# Patient Record
Sex: Female | Born: 1965
Health system: Southern US, Community
[De-identification: ages and names within clinical notes are randomized; demographics above are authoritative.]

## PROBLEM LIST (undated history)

## (undated) DIAGNOSIS — R945 Abnormal results of liver function studies: Secondary | ICD-10-CM

## (undated) DIAGNOSIS — C4491 Basal cell carcinoma of skin, unspecified: Secondary | ICD-10-CM

## (undated) DIAGNOSIS — F411 Generalized anxiety disorder: Secondary | ICD-10-CM

## (undated) DIAGNOSIS — L408 Other psoriasis: Secondary | ICD-10-CM

## (undated) DIAGNOSIS — M722 Plantar fascial fibromatosis: Principal | ICD-10-CM

## (undated) DIAGNOSIS — F32A Depression, unspecified: Secondary | ICD-10-CM

## (undated) DIAGNOSIS — M79605 Pain in left leg: Secondary | ICD-10-CM

## (undated) DIAGNOSIS — R7989 Other specified abnormal findings of blood chemistry: Secondary | ICD-10-CM

## (undated) DIAGNOSIS — R5383 Other fatigue: Secondary | ICD-10-CM

## (undated) DIAGNOSIS — F319 Bipolar disorder, unspecified: Secondary | ICD-10-CM

## (undated) DIAGNOSIS — M79604 Pain in right leg: Secondary | ICD-10-CM

## (undated) DIAGNOSIS — R635 Abnormal weight gain: Secondary | ICD-10-CM

## (undated) HISTORY — DX: Generalized anxiety disorder: F41.1

## (undated) HISTORY — PX: OTHER SURGICAL HISTORY: SHX169

## (undated) HISTORY — PX: CHOLECYSTECTOMY: SHX55

## (undated) HISTORY — DX: Plantar fascial fibromatosis: M72.2

## (undated) HISTORY — DX: Other specified abnormal findings of blood chemistry: R79.89

## (undated) HISTORY — DX: Bipolar disorder, unspecified: F31.9

## (undated) HISTORY — DX: Depression, unspecified: F32.A

## (undated) HISTORY — DX: Abnormal weight gain: R63.5

## (undated) HISTORY — DX: Other psoriasis: L40.8

## (undated) HISTORY — DX: Pain in right leg: M79.604

## (undated) HISTORY — DX: Abnormal results of liver function studies: R94.5

## (undated) HISTORY — DX: Other fatigue: R53.83

## (undated) HISTORY — DX: Pain in left leg: M79.605

## (undated) HISTORY — PX: TONSILECTOMY, ADENOIDECTOMY, BILATERAL MYRINGOTOMY AND TUBES: SHX2538

## (undated) HISTORY — DX: Basal cell carcinoma of skin, unspecified: C44.91

---

## 2006-05-15 ENCOUNTER — Ambulatory Visit: Payer: Self-pay | Admitting: Internal Medicine

## 2007-01-21 ENCOUNTER — Inpatient Hospital Stay (HOSPITAL_COMMUNITY): Admission: AD | Admit: 2007-01-21 | Discharge: 2007-01-25 | Payer: Self-pay | Admitting: Obstetrics and Gynecology

## 2007-01-21 ENCOUNTER — Encounter (INDEPENDENT_AMBULATORY_CARE_PROVIDER_SITE_OTHER): Payer: Self-pay | Admitting: Specialist

## 2007-01-23 ENCOUNTER — Encounter (INDEPENDENT_AMBULATORY_CARE_PROVIDER_SITE_OTHER): Payer: Self-pay | Admitting: Cardiology

## 2007-01-23 ENCOUNTER — Ambulatory Visit: Payer: Self-pay | Admitting: Vascular Surgery

## 2007-01-23 IMAGING — CT CT ANGIO CHEST
1 of 3 series · 9 of 29 positions shown · IV contrast (150ml omni/300%)
Comparison: none

CLINICAL DATA: Short of breath following C-section.  Assess for pulmonary embolus. 
 CT ANGIOGRAPHY OF CHEST:
TECHNIQUE: Multidetector CT imaging of the chest was performed during bolus injection of intravenous contrast.  Multiplanar CT angiographic image reconstructions were generated to evaluate the vascular anatomy.
 Contrast:  150 cc Omnipaque 300.

[Series 2: pe chest · axial · 0.70mm/px · z∈[-250,-28]mm · 9 of 115 slices shown]
[im 13/115  lung]
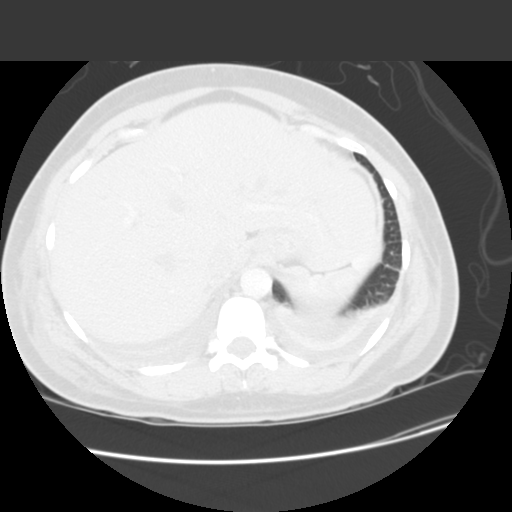
[im 26/115  mediastinal]
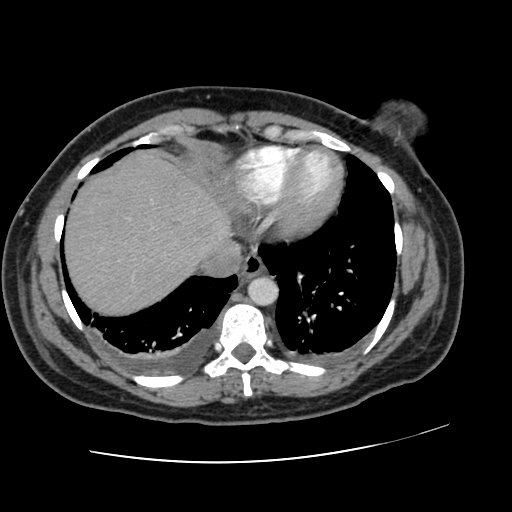
[im 39/115  lung]
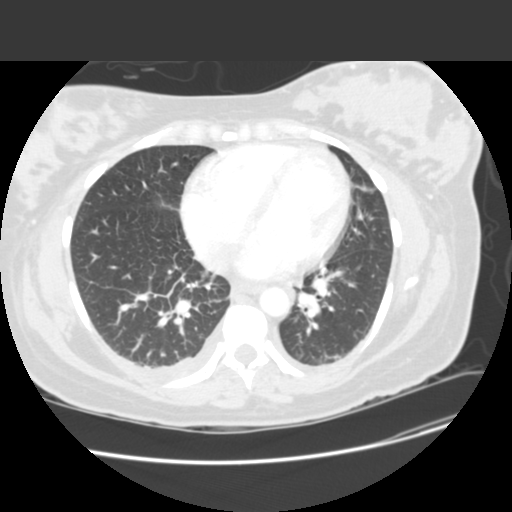
[im 51/115  mediastinal]
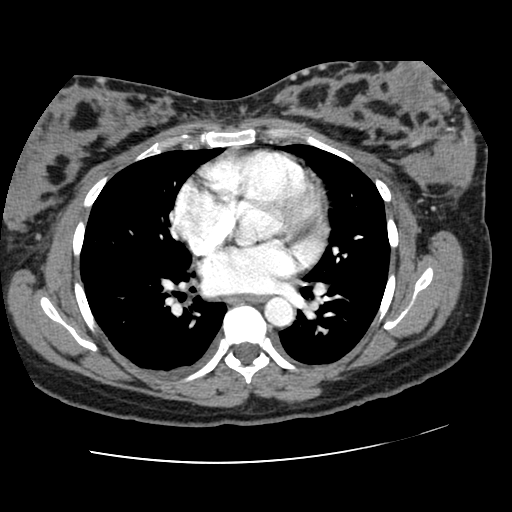
[im 54/115  lung]
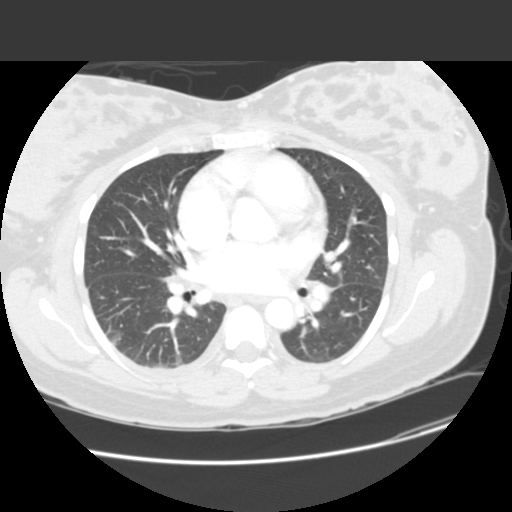
[im 64/115  mediastinal]
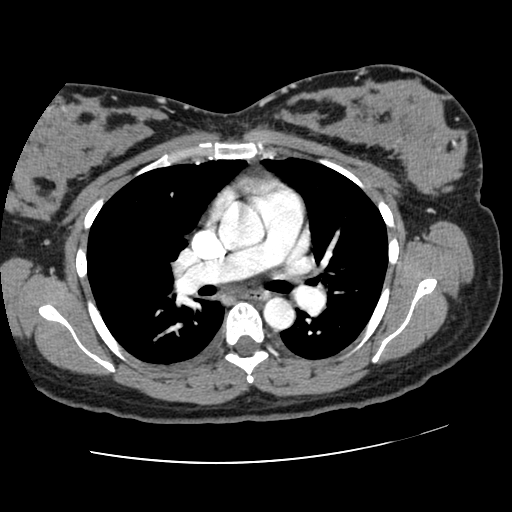
[im 77/115  lung]
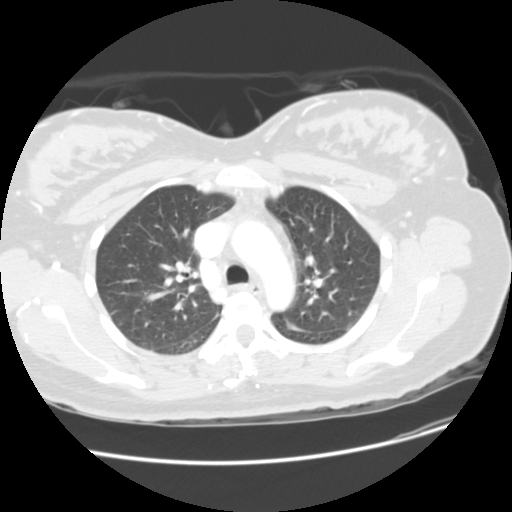
[im 89/115  mediastinal]
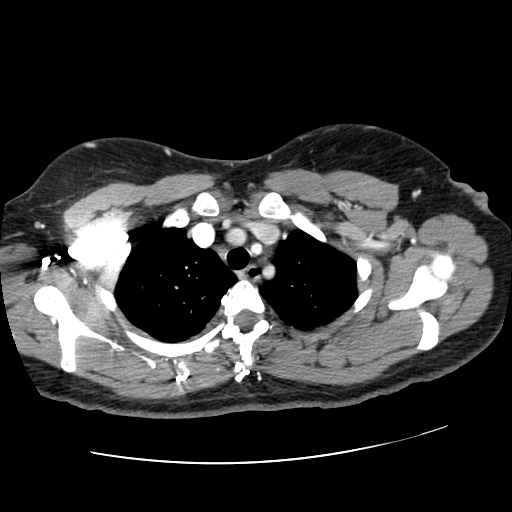
[im 102/115  lung]
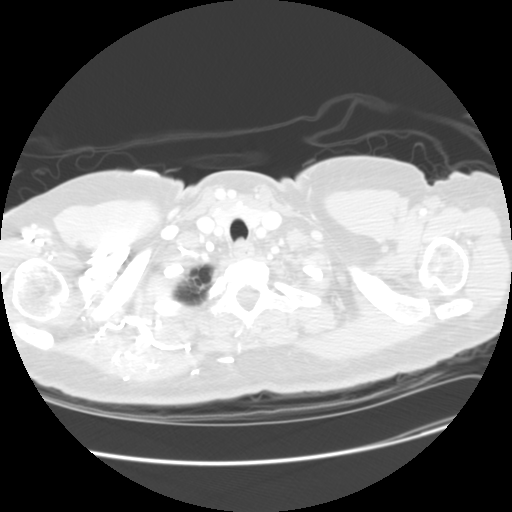

[9 of 29 positions shown; findings below may reference images not displayed]

FINDINGS: Scans in the upper abdomen are unremarkable.  There are bilateral pleural effusions with dependent pulmonary atelectasis.  The lungs show a pattern of increased interstitial prominence that could represent edema.  The pulmonary arterial tree does not show any emboli.  No aortic pathology is seen.  No chest wall lesion.
IMPRESSION: 1.  No evidence of pulmonary emboli or acute aortic pathology. 
 2.  Bilateral pleural effusions.  Dependent pulmonary atelectasis and a mild interstitial edema pattern.

## 2007-01-23 IMAGING — CT CT HEAD W/O CM
1 series · 16 of 30 positions shown, 20 images · IV contrast (agent unspecified)
Comparison: none

CLINICAL DATA: Headaches.  Post C-section. 
 HEAD CT WITH CONTRAST:
TECHNIQUE: Contiguous axial images were obtained from the base of the skull through the vertex according to standard protocol without contrast.  This study was done after a CT angiogram of the chest and therefore this is a contrast-enhanced study, though no additional contrast was given for this beyond the CT study.

[Series 2: brain · axial · 0.47mm/px · z∈[+78,+216]mm · 16 of 30 slices shown, 20 images]
[im 2/30  brain]
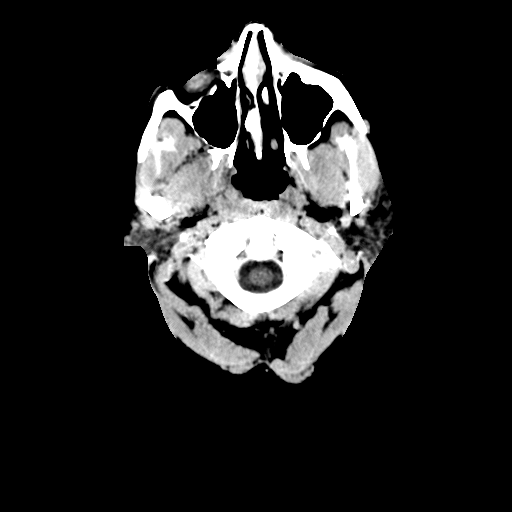
[im 2/30  bone]
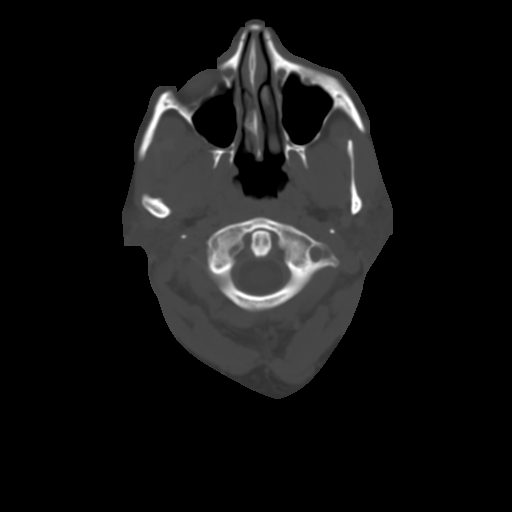
[im 4/30  brain]
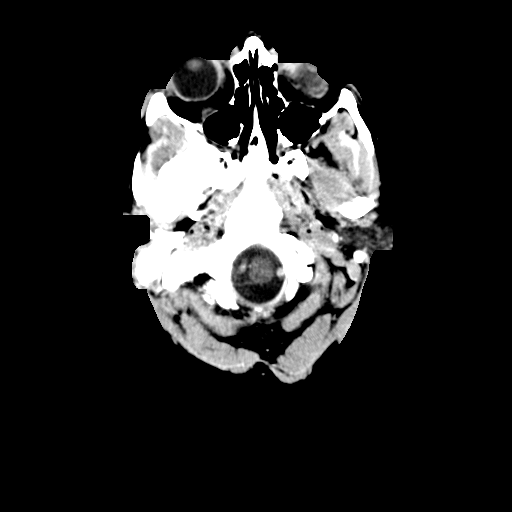
[im 6/30  brain]
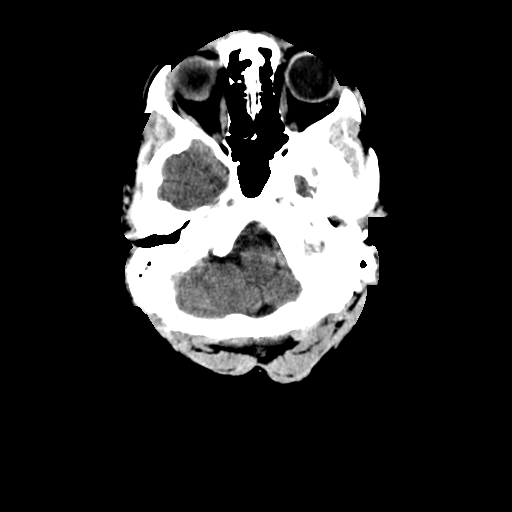
[im 8/30  brain]
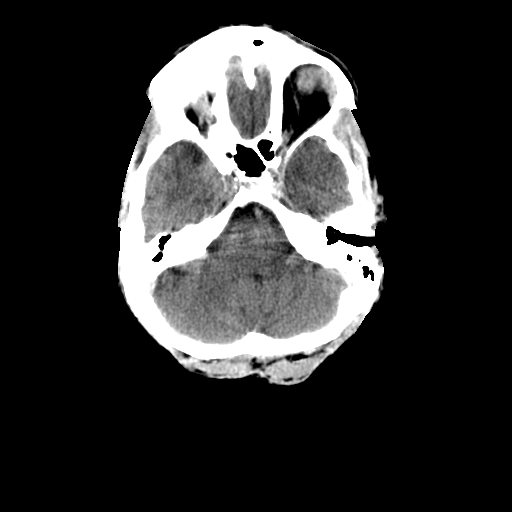
[im 9/30  brain]
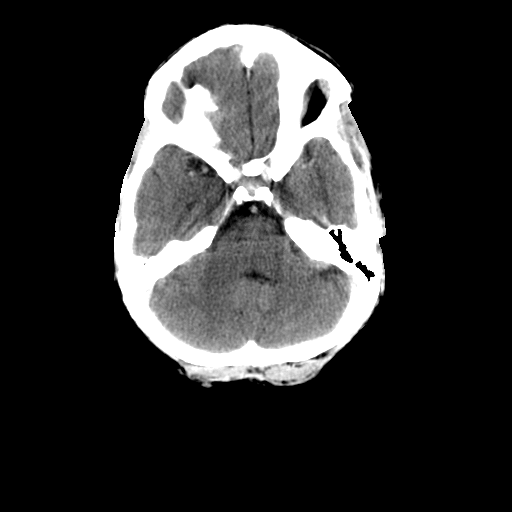
[im 9/30  bone]
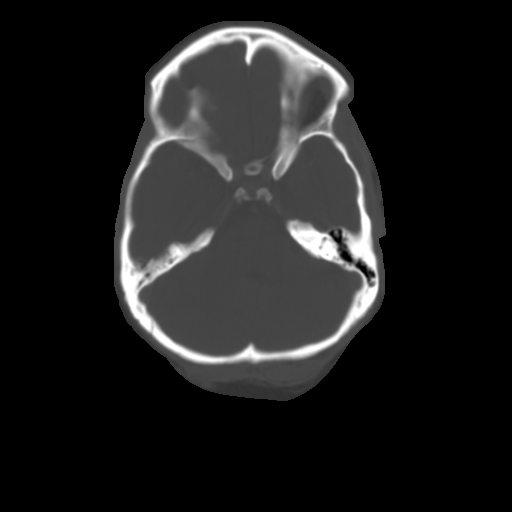
[im 11/30  brain]
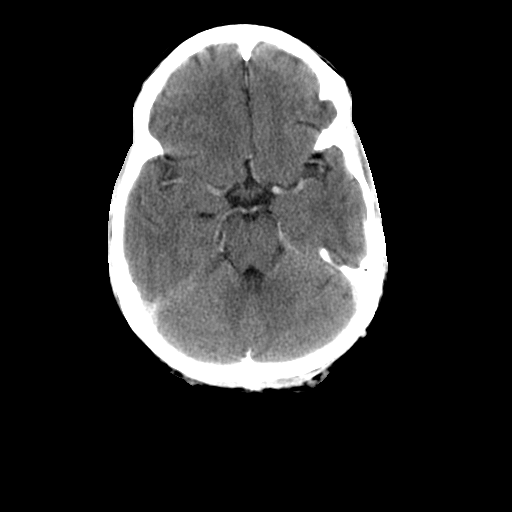
[im 13/30  brain]
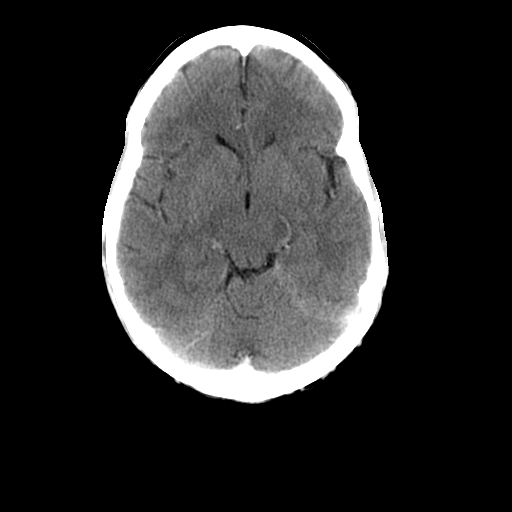
[im 15/30  brain]
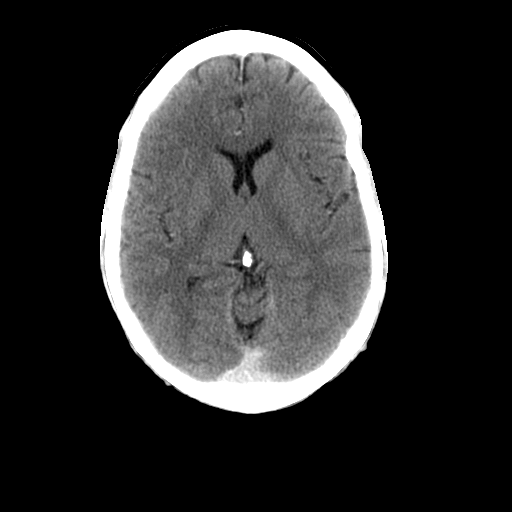
[im 16/30  brain]
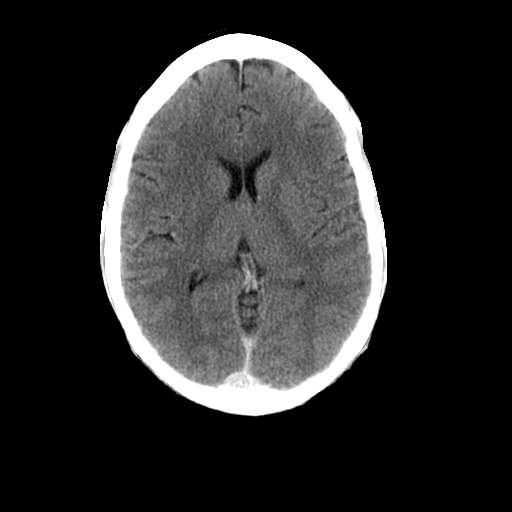
[im 16/30  bone]
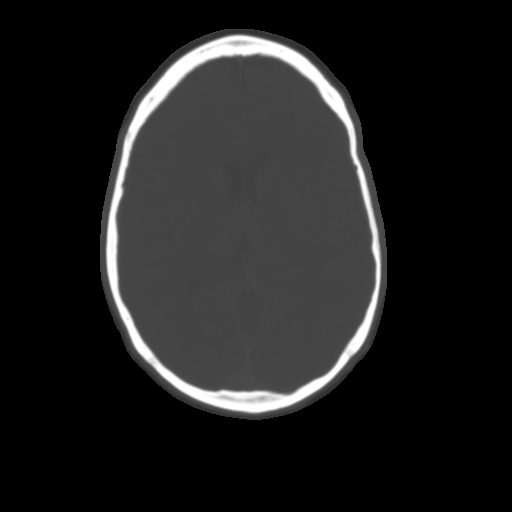
[im 18/30  brain]
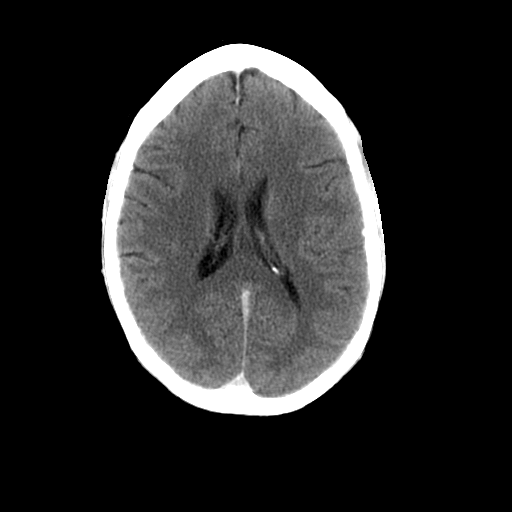
[im 20/30  brain]
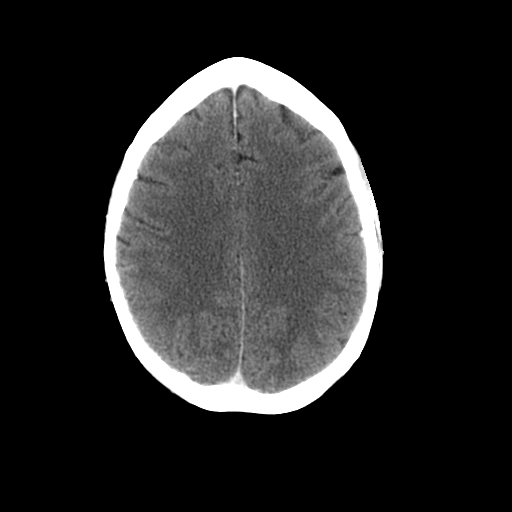
[im 22/30  brain]
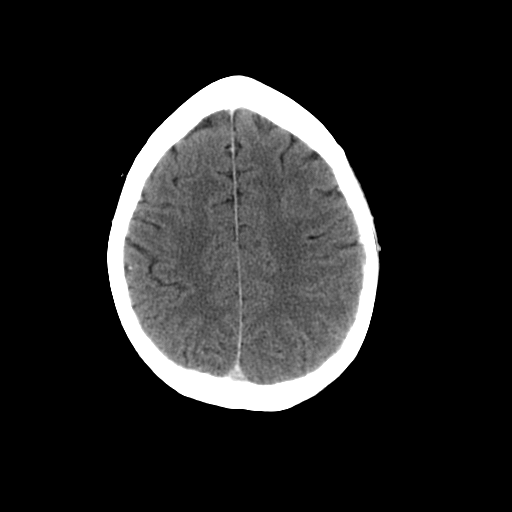
[im 23/30  brain]
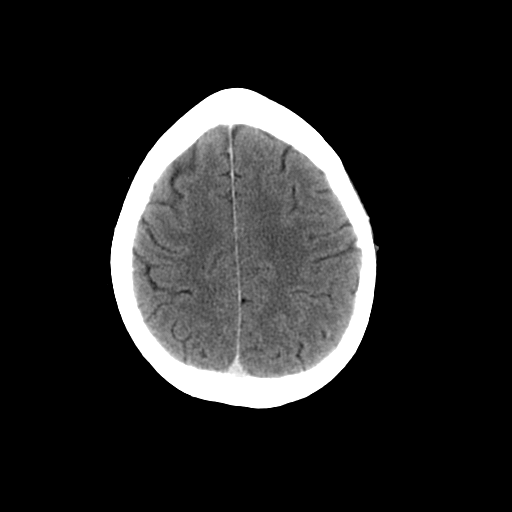
[im 23/30  bone]
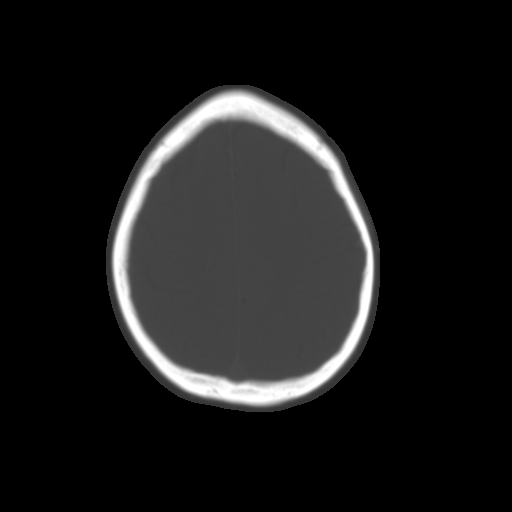
[im 25/30  brain]
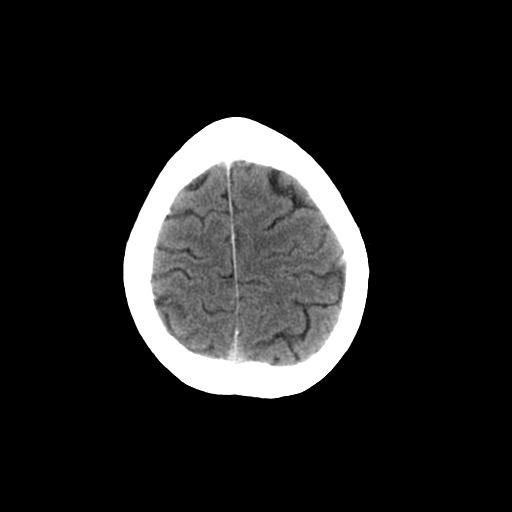
[im 27/30  brain]
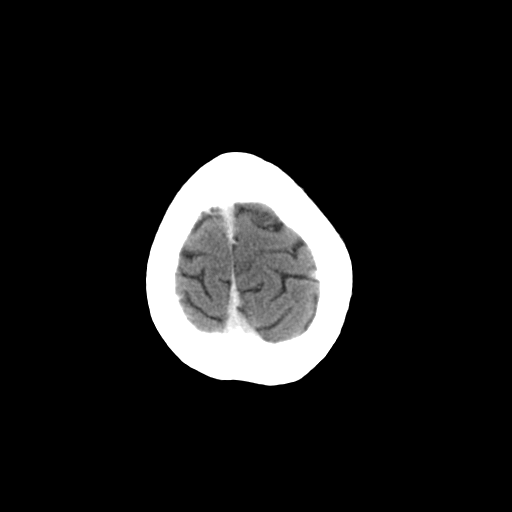
[im 29/30  brain]
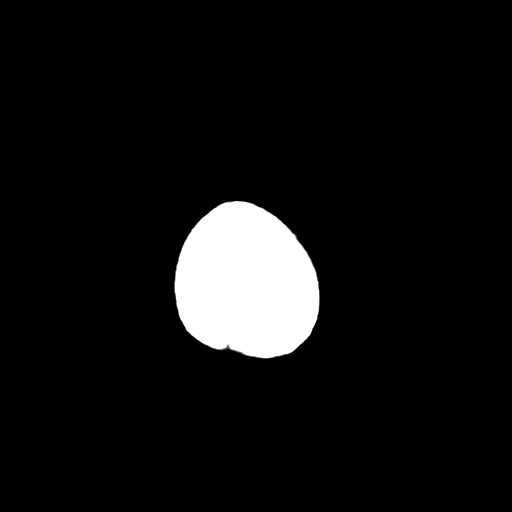

[16 of 30 positions shown; findings below may reference images not displayed]

FINDINGS: The brain has a normal appearance without evidence of atrophy, stroke, mass, hemorrhage, hydrocephalus or extraaxial collection.  The calvarium is unremarkable.  The paranasal sinuses, middle ears and mastoids are clear.
IMPRESSION: Normal exam.

## 2007-01-26 ENCOUNTER — Ambulatory Visit: Admission: RE | Admit: 2007-01-26 | Discharge: 2007-01-26 | Payer: Self-pay | Admitting: Obstetrics and Gynecology

## 2007-04-24 DIAGNOSIS — L408 Other psoriasis: Secondary | ICD-10-CM

## 2007-04-24 HISTORY — DX: Other psoriasis: L40.8

## 2007-05-22 ENCOUNTER — Ambulatory Visit: Payer: Self-pay | Admitting: Internal Medicine

## 2007-05-22 DIAGNOSIS — F329 Major depressive disorder, single episode, unspecified: Secondary | ICD-10-CM

## 2007-05-22 DIAGNOSIS — F419 Anxiety disorder, unspecified: Secondary | ICD-10-CM | POA: Insufficient documentation

## 2007-05-22 DIAGNOSIS — F319 Bipolar disorder, unspecified: Secondary | ICD-10-CM

## 2007-05-22 DIAGNOSIS — F411 Generalized anxiety disorder: Secondary | ICD-10-CM

## 2007-05-22 DIAGNOSIS — K8020 Calculus of gallbladder without cholecystitis without obstruction: Secondary | ICD-10-CM | POA: Insufficient documentation

## 2007-05-22 DIAGNOSIS — F32A Depression, unspecified: Secondary | ICD-10-CM | POA: Insufficient documentation

## 2007-05-22 HISTORY — DX: Bipolar disorder, unspecified: F31.9

## 2007-05-22 HISTORY — DX: Generalized anxiety disorder: F41.1

## 2007-05-23 LAB — CONVERTED CEMR LAB
ALT: 22 units/L (ref 0–35)
AST: 19 units/L (ref 0–37)
Albumin: 3.7 g/dL (ref 3.5–5.2)
Alkaline Phosphatase: 40 units/L (ref 39–117)
BUN: 12 mg/dL (ref 6–23)
Basophils Absolute: 0 10*3/uL (ref 0.0–0.1)
Basophils Relative: 0.1 % (ref 0.0–1.0)
Bilirubin, Direct: 0.1 mg/dL (ref 0.0–0.3)
CO2: 31 meq/L (ref 19–32)
Calcium: 9.6 mg/dL (ref 8.4–10.5)
Chloride: 108 meq/L (ref 96–112)
Cholesterol: 151 mg/dL (ref 0–200)
Creatinine, Ser: 0.7 mg/dL (ref 0.4–1.2)
Eosinophils Absolute: 0.2 10*3/uL (ref 0.0–0.6)
Eosinophils Relative: 1.9 % (ref 0.0–5.0)
GFR calc Af Amer: 119 mL/min
GFR calc non Af Amer: 98 mL/min
Glucose, Bld: 101 mg/dL — ABNORMAL HIGH (ref 70–99)
HCT: 38.1 % (ref 36.0–46.0)
HDL: 42.8 mg/dL (ref >39.0)
Hemoglobin: 13.1 g/dL (ref 12.0–15.0)
LDL Cholesterol: 97 mg/dL (ref 0–99)
Lymphocytes Relative: 26.3 % (ref 12.0–46.0)
MCHC: 34.4 g/dL (ref 30.0–36.0)
MCV: 87 fL (ref 78.0–100.0)
Monocytes Absolute: 0.5 10*3/uL (ref 0.2–0.7)
Monocytes Relative: 6.6 % (ref 3.0–11.0)
Neutro Abs: 5.4 10*3/uL (ref 1.4–7.7)
Neutrophils Relative %: 65.1 % (ref 43.0–77.0)
Platelets: 275 10*3/uL (ref 150–400)
Potassium: 4.1 meq/L (ref 3.5–5.1)
RBC: 4.38 M/uL (ref 3.87–5.11)
RDW: 12.5 % (ref 11.5–14.6)
Sodium: 146 meq/L — ABNORMAL HIGH (ref 135–145)
TSH: 1.85 microintl units/mL (ref 0.35–5.50)
Total Bilirubin: 0.6 mg/dL (ref 0.3–1.2)
Total CHOL/HDL Ratio: 3.5
Total Protein: 7.3 g/dL (ref 6.0–8.3)
Triglycerides: 56 mg/dL (ref 0–149)
VLDL: 11 mg/dL (ref 0–40)
WBC: 8.3 10*3/uL (ref 4.5–10.5)

## 2007-07-11 ENCOUNTER — Ambulatory Visit: Payer: Self-pay | Admitting: Internal Medicine

## 2007-07-12 ENCOUNTER — Telehealth: Payer: Self-pay | Admitting: Internal Medicine

## 2008-07-18 ENCOUNTER — Ambulatory Visit: Payer: Self-pay | Admitting: Internal Medicine

## 2008-07-18 LAB — CONVERTED CEMR LAB: Rapid Strep: NEGATIVE

## 2008-07-30 ENCOUNTER — Ambulatory Visit: Payer: Self-pay | Admitting: Internal Medicine

## 2008-08-04 ENCOUNTER — Telehealth (INDEPENDENT_AMBULATORY_CARE_PROVIDER_SITE_OTHER): Payer: Self-pay | Admitting: *Deleted

## 2008-09-26 ENCOUNTER — Ambulatory Visit: Payer: Self-pay | Admitting: Internal Medicine

## 2009-01-10 ENCOUNTER — Encounter: Payer: Self-pay | Admitting: Internal Medicine

## 2009-01-10 ENCOUNTER — Ambulatory Visit: Payer: Self-pay | Admitting: Family Medicine

## 2009-01-10 LAB — CONVERTED CEMR LAB: Rapid Strep: NEGATIVE

## 2009-01-12 ENCOUNTER — Ambulatory Visit: Payer: Self-pay | Admitting: Internal Medicine

## 2009-01-12 LAB — CONVERTED CEMR LAB: Rapid Strep: NEGATIVE

## 2009-02-23 ENCOUNTER — Telehealth: Payer: Self-pay | Admitting: Internal Medicine

## 2009-02-24 ENCOUNTER — Ambulatory Visit: Payer: Self-pay | Admitting: Family Medicine

## 2009-02-24 LAB — CONVERTED CEMR LAB: Rapid Strep: NEGATIVE

## 2009-02-25 ENCOUNTER — Encounter: Payer: Self-pay | Admitting: Family Medicine

## 2009-03-02 ENCOUNTER — Telehealth: Payer: Self-pay | Admitting: *Deleted

## 2009-04-13 ENCOUNTER — Ambulatory Visit: Payer: Self-pay | Admitting: Family Medicine

## 2009-04-13 ENCOUNTER — Encounter: Payer: Self-pay | Admitting: Family Medicine

## 2009-04-13 LAB — CONVERTED CEMR LAB: Rapid Strep: NEGATIVE

## 2009-04-14 ENCOUNTER — Encounter: Payer: Self-pay | Admitting: Family Medicine

## 2009-04-17 ENCOUNTER — Telehealth: Payer: Self-pay | Admitting: Family Medicine

## 2009-05-07 ENCOUNTER — Encounter: Payer: Self-pay | Admitting: Internal Medicine

## 2009-07-14 ENCOUNTER — Telehealth: Payer: Self-pay | Admitting: Internal Medicine

## 2009-12-03 ENCOUNTER — Ambulatory Visit: Payer: Self-pay | Admitting: Internal Medicine

## 2009-12-03 LAB — CONVERTED CEMR LAB
ALT: 22 units/L (ref 0–35)
AST: 22 units/L (ref 0–37)
Albumin: 4.2 g/dL (ref 3.5–5.2)
Alkaline Phosphatase: 45 units/L (ref 39–117)
BUN: 14 mg/dL (ref 6–23)
Basophils Absolute: 0 10*3/uL (ref 0.0–0.1)
Basophils Relative: 0.6 % (ref 0.0–3.0)
Bilirubin Urine: NEGATIVE
Bilirubin, Direct: 0.1 mg/dL (ref 0.0–0.3)
Blood in Urine, dipstick: NEGATIVE
CO2: 29 meq/L (ref 19–32)
Calcium: 9.4 mg/dL (ref 8.4–10.5)
Chloride: 110 meq/L (ref 96–112)
Cholesterol: 140 mg/dL (ref 0–200)
Creatinine, Ser: 0.8 mg/dL (ref 0.4–1.2)
Eosinophils Absolute: 0.1 10*3/uL (ref 0.0–0.7)
Eosinophils Relative: 1.6 % (ref 0.0–5.0)
GFR calc non Af Amer: 82.98 mL/min (ref >60)
Glucose, Bld: 104 mg/dL — ABNORMAL HIGH (ref 70–99)
Glucose, Urine, Semiquant: NEGATIVE
HCT: 37.6 % (ref 36.0–46.0)
HDL: 38.6 mg/dL — ABNORMAL LOW (ref >39.00)
Hemoglobin: 12.7 g/dL (ref 12.0–15.0)
Ketones, urine, test strip: NEGATIVE
LDL Cholesterol: 77 mg/dL (ref 0–99)
Lymphocytes Relative: 32.3 % (ref 12.0–46.0)
Lymphs Abs: 2 10*3/uL (ref 0.7–4.0)
MCHC: 33.8 g/dL (ref 30.0–36.0)
MCV: 92.1 fL (ref 78.0–100.0)
Monocytes Absolute: 0.3 10*3/uL (ref 0.1–1.0)
Monocytes Relative: 4.8 % (ref 3.0–12.0)
Neutro Abs: 3.7 10*3/uL (ref 1.4–7.7)
Neutrophils Relative %: 60.7 % (ref 43.0–77.0)
Nitrite: NEGATIVE
Platelets: 229 10*3/uL (ref 150.0–400.0)
Potassium: 4.6 meq/L (ref 3.5–5.1)
RBC: 4.08 M/uL (ref 3.87–5.11)
RDW: 11.7 % (ref 11.5–14.6)
Sodium: 145 meq/L (ref 135–145)
Specific Gravity, Urine: 1.02
TSH: 2.71 microintl units/mL (ref 0.35–5.50)
Total Bilirubin: 0.3 mg/dL (ref 0.3–1.2)
Total CHOL/HDL Ratio: 4
Total Protein: 7.5 g/dL (ref 6.0–8.3)
Triglycerides: 124 mg/dL (ref 0.0–149.0)
Urobilinogen, UA: 0.2
VLDL: 24.8 mg/dL (ref 0.0–40.0)
WBC Urine, dipstick: NEGATIVE
WBC: 6.1 10*3/uL (ref 4.5–10.5)
pH: 7

## 2009-12-10 ENCOUNTER — Ambulatory Visit: Payer: Self-pay | Admitting: Internal Medicine

## 2009-12-10 DIAGNOSIS — G4700 Insomnia, unspecified: Secondary | ICD-10-CM | POA: Insufficient documentation

## 2010-01-21 ENCOUNTER — Ambulatory Visit (HOSPITAL_COMMUNITY): Admission: RE | Admit: 2010-01-21 | Discharge: 2010-01-21 | Payer: Self-pay | Admitting: Obstetrics and Gynecology

## 2010-01-21 IMAGING — MG MM DIGITAL SCREENING
4 series · 4 of 4 positions shown · non-contrast
Comparison: none

DG SCREEN MAMMOGRAM BILATERAL
Bilateral CC and MLO view(s) were taken.
Technologist: ENANO.(ENANO)(M)

DIGITAL SCREENING MAMMOGRAM WITH CAD:
There are scattered fibroglandular densities.  There is no dominant mass, architectural distortion 
or calcification to suggest malignancy.
Images were processed with CAD.

[R CC]
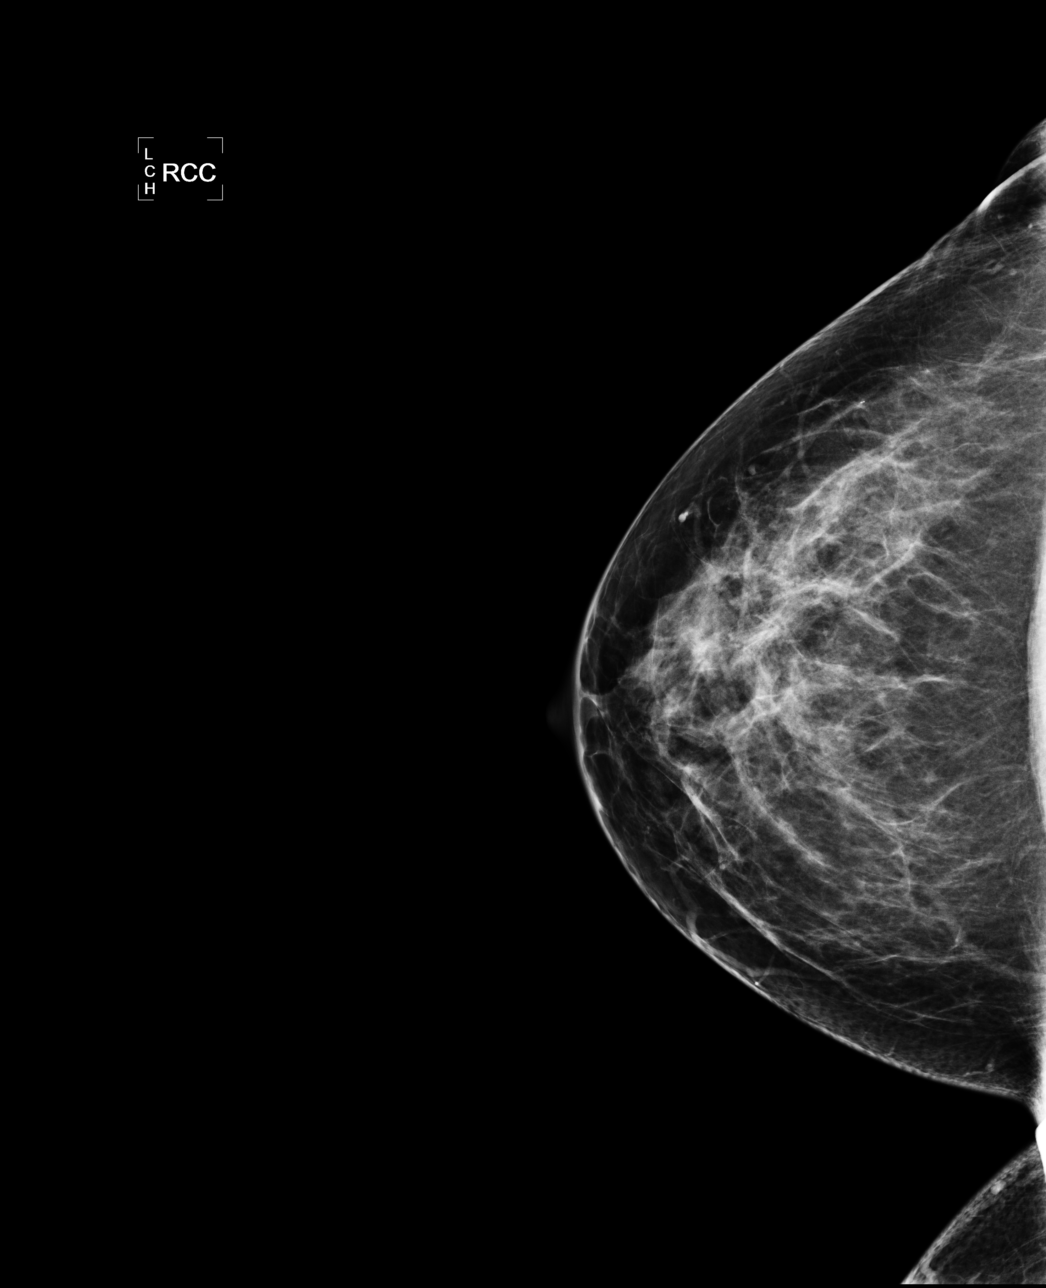

[R MLO]
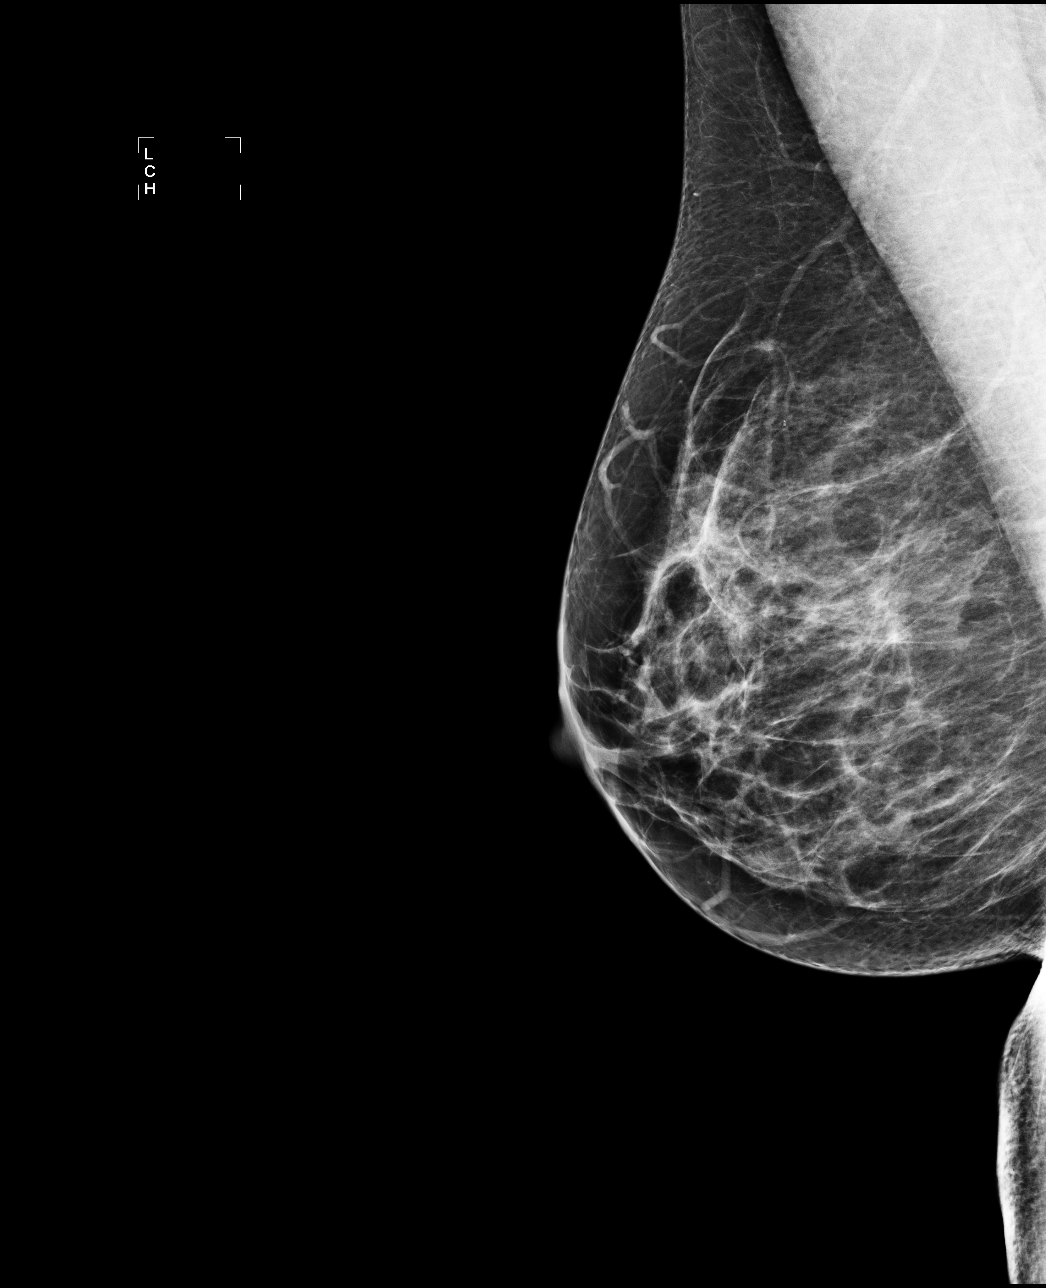

[L CC]
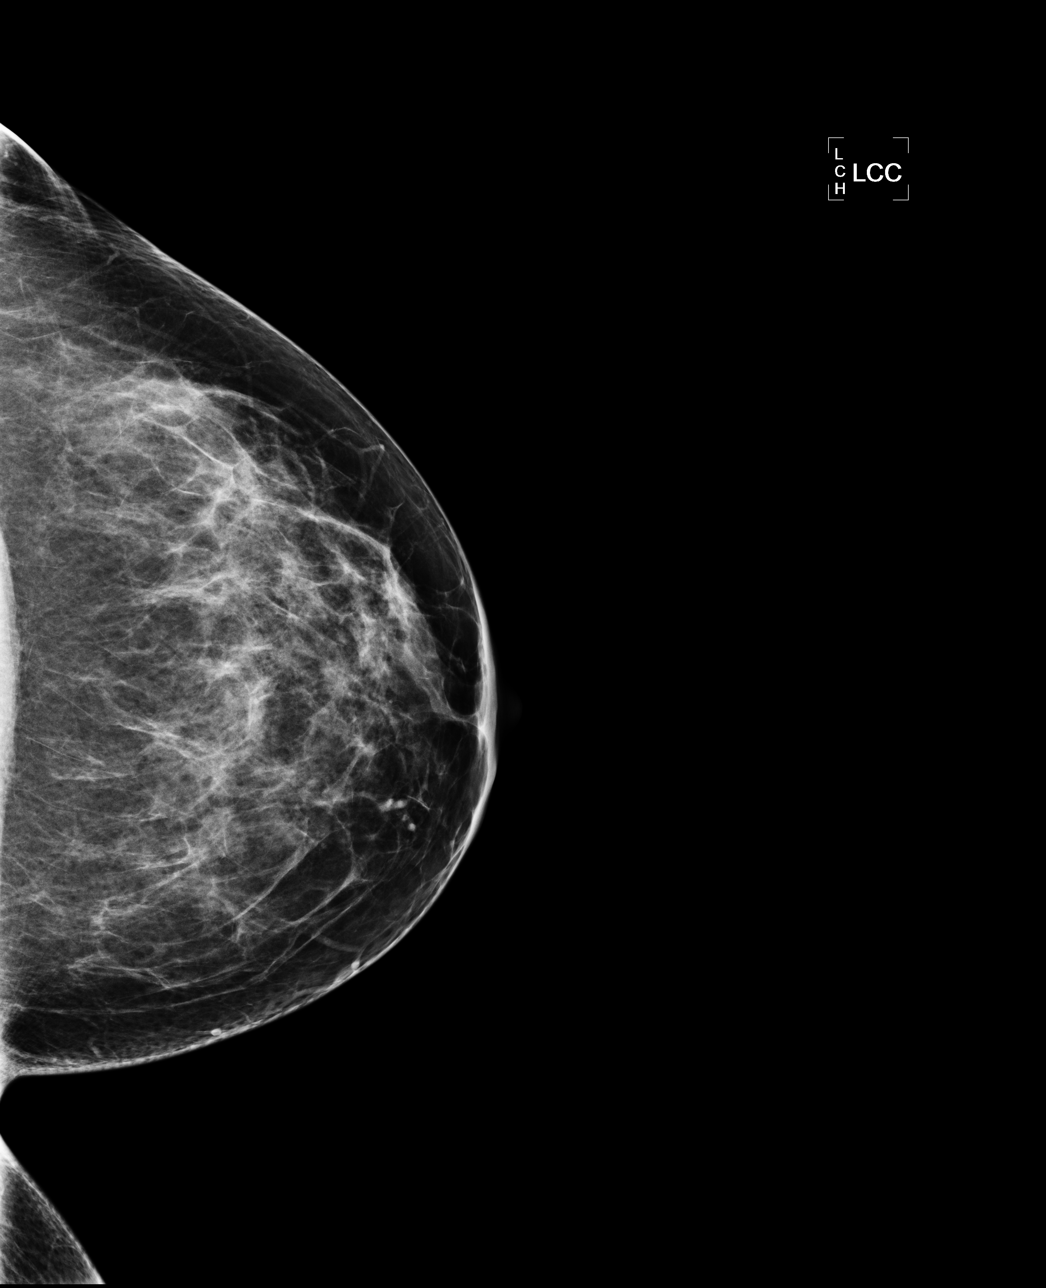

[L MLO]
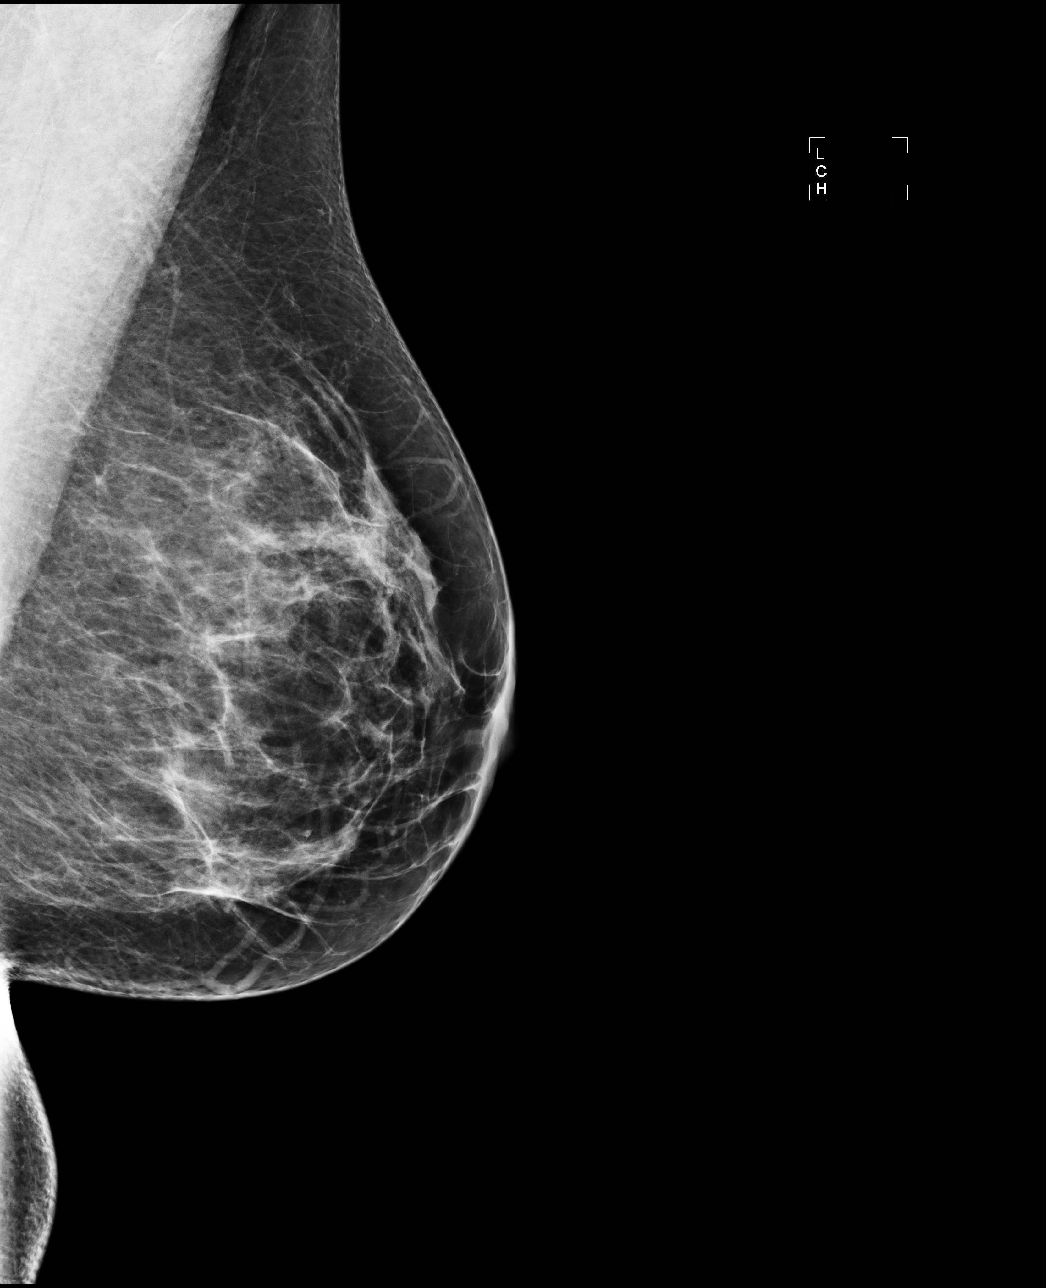

[4 of 4 positions shown; findings below may reference images not displayed]

IMPRESSION: No mammographic evidence of malignancy.  Suggest yearly screening mammography.

A result letter of this screening mammogram will be mailed directly to the patient.

ASSESSMENT: Negative - BI-RADS 1

Screening mammogram in 1 year.
,

## 2010-03-02 ENCOUNTER — Telehealth: Payer: Self-pay | Admitting: Internal Medicine

## 2010-06-30 ENCOUNTER — Ambulatory Visit: Payer: Self-pay | Admitting: Internal Medicine

## 2010-10-10 ENCOUNTER — Encounter: Payer: Self-pay | Admitting: *Deleted

## 2010-10-19 NOTE — Assessment & Plan Note (Signed)
Summary: STEP THROAT/LD   Vital Signs:  Patient profile:   45 year old female Height:      65 inches Weight:      160 pounds BMI:     26.72 Temp:     97.9 degrees F oral Pulse rate:   88 / minute Pulse rhythm:   regular BP sitting:   126 / 80  (left arm) Cuff size:   regular  Vitals Entered By: Liane Comber (January 10, 2009 11:07 AM)  History of Present Illness: whole body is aching  R ear hurts R tonsil is inflammed - and some white stuff on it  has had strep many times in past - really feels like it   no particular exp is working lots of hours - generally really run down   no nasal symptoms or drainage  no fever known but is achey - and legs feel sore and crampy   no cough  no rash  no joint swelling  does have headache   Allergies: No Known Drug Allergies  Past History:  Past Medical History:    psoriasis    heart ablation    Cholelithiasis    frequent pharyngitis   Past Surgical History:    Reviewed history from 05/22/2007 and no changes required:    csection x 2    c section--requiring blood transfusion    Cholecystectomy  Review of Systems General:  Complains of chills, fatigue, and malaise; denies loss of appetite. Eyes:  Denies discharge and eye pain. ENT:  Complains of earache and hoarseness; denies ear discharge, nasal congestion, and postnasal drainage. CV:  Denies chest pain or discomfort and palpitations. Resp:  Denies cough and wheezing. GI:  Denies abdominal pain, change in bowel habits, nausea, and vomiting. MS:  Denies joint pain. Derm:  Denies itching and rash.  Physical Exam  General:  Well-developed,well-nourished,in no acute distress; alert,appropriate and cooperative throughout examination Head:  normocephalic, atraumatic, and no abnormalities observed.  no sinus tenderness  Eyes:  vision grossly intact, pupils equal, pupils round, pupils reactive to light, and no injection.   Ears:  TMs clear bilat  Nose:  nares slt boggy but  clear  Mouth:  enlarged R tonsil with crypts/debris , poss scant exudate , also erythema  mild post pharyngeal erythema  Neck:  No deformities, masses, or tenderness noted. Lungs:  Normal respiratory effort, chest expands symmetrically. Lungs are clear to auscultation, no crackles or wheezes. Heart:  Normal rate and regular rhythm. S1 and S2 normal without gallop, murmur, click, rub or other extra sounds. Skin:  Intact without suspicious lesions or rashes Cervical Nodes:  No lymphadenopathy noted Psych:  normal affect, talkative and pleasant    Impression & Recommendations:  Problem # 1:  TONSILLITIS (ICD-463) Assessment New R tonsillar erythema/swelling with some debris (poss small amt exudate)  rapid strep neg throat cx sent  amoxicillin 500 three times a day for 10 days update if worse/prob swallowing  recommend sympt care- see pt instructions   Orders: T-Culture, Throat ([id number]) Rapid Strep (28413)  Complete Medication List: 1)  Alprazolam 0.25 Mg Tabs (Alprazolam) .... Once daily as needed 2)  Citalopram Hydrobromide 20 Mg Tabs (Citalopram hydrobromide) .... Once daily 3)  Allfen Dm 400-20 Mg Tabs (Dextromethorphan-guaifenesin) .... Take 1 tablet by mouth two times a day 4)  Fluticasone Propionate 50 Mcg/act Susp (Fluticasone propionate) .... 2 sprays each nostril once daily 5)  Amoxicillin 500 Mg Tabs (Amoxicillin) .Marland Kitchen.. 1 by mouth three times a day  for 10 days  Patient Instructions: 1)  drink lots of fluids  2)  tylenol as needed - pain and fever  3)  salt water gargle or chloraseptic spray 4)  update me if not improved next week or if worse or unable to swallow  5)  take the amoxicillin as directed  6)  will update you when throat culture returns  Prescriptions: AMOXICILLIN 500 MG TABS (AMOXICILLIN) 1 by mouth three times a day for 10 days  #30 x 0   Entered and Authorized by:   Judith Part MD   Signed by:   Judith Part MD on 01/10/2009   Method used:    Print then Give to Patient   RxID:   [id number]       Prior Medications (reviewed today): ALPRAZOLAM 0.25 MG TABS (ALPRAZOLAM) once daily as needed CITALOPRAM HYDROBROMIDE 20 MG TABS (CITALOPRAM HYDROBROMIDE) once daily ALLFEN DM 400-20 MG TABS (DEXTROMETHORPHAN-GUAIFENESIN) Take 1 tablet by mouth two times a day FLUTICASONE PROPIONATE 50 MCG/ACT  SUSP (FLUTICASONE PROPIONATE) 2 sprays each nostril once daily Current Allergies (reviewed today): No known allergies  Current Medications (including changes made in today's visit):  ALPRAZOLAM 0.25 MG TABS (ALPRAZOLAM) once daily as needed CITALOPRAM HYDROBROMIDE 20 MG TABS (CITALOPRAM HYDROBROMIDE) once daily ALLFEN DM 400-20 MG TABS (DEXTROMETHORPHAN-GUAIFENESIN) Take 1 tablet by mouth two times a day FLUTICASONE PROPIONATE 50 MCG/ACT  SUSP (FLUTICASONE PROPIONATE) 2 sprays each nostril once daily AMOXICILLIN 500 MG TABS (AMOXICILLIN) 1 by mouth three times a day for 10 days   Laboratory Results  Date/Time Received: January 10, 2009 11:16 AM Date/Time Reported: January 10, 2009 11:16 AM  Other Tests  Rapid Strep: negative

## 2010-10-19 NOTE — Progress Notes (Signed)
Summary: refill alprazolam  Phone Note From Pharmacy Call back at [phone] fax   [facility]  [address]  [phone]* Call For: swords  Summary of Call: Refill alprazolam 0.25mg  Take one tablet by mouth every other day as needed .  Last 12/03/09 #15 x 2 Initial call taken by: Gladis Riffle, RN,  March 02, 2010 3:18 PM  Follow-up for Phone Call        Was removed from med list 12/10/09 as was" using too much for sleep".  Please advise. Follow-up by: Gladis Riffle, RN,  March 02, 2010 3:19 PM  Additional Follow-up for Phone Call Additional follow up Details #1::        ok to refill Additional Follow-up by: Birdie Sons MD,  March 02, 2010 3:25 PM    Additional Follow-up for Phone Call Additional follow up Details #2::    see Rx Follow-up by: Gladis Riffle, RN,  March 02, 2010 4:12 PM  New/Updated Medications: ALPRAZOLAM 0.25 MG TABS (ALPRAZOLAM) Take one tablet by mouth every other day as needed Prescriptions: ALPRAZOLAM 0.25 MG TABS (ALPRAZOLAM) Take one tablet by mouth every other day as needed  #15 x 2   Entered by:   Gladis Riffle, RN   Authorized by:   Birdie Sons MD   Signed by:   Gladis Riffle, RN on 03/02/2010   Method used:   Telephoned to ...       [facility]  [phone]* (retail)       [address] [place], [place]  [address]       Ph: [phone] or [phone]       Fax: [phone]   RxID:   [id number]

## 2010-10-19 NOTE — Op Note (Signed)
Summary: Tonsillectomy/Dr. Narda Bonds  Tonsillectomy/Dr. Narda Bonds   Imported By: Maryln Gottron 05/21/2009 10:27:42  _____________________________________________________________________  External Attachment:    Type:   Image     Comment:   External Document

## 2010-10-19 NOTE — Assessment & Plan Note (Signed)
Summary: ear ache/mhf   Vital Signs:  Patient Profile:   45 Years Old Female Height:     65 inches Temp:     98.6 degrees F Pulse rate:   76 / minute BP sitting:   124 / 98  (left arm)  Vitals Entered By: Gladis Riffle, RN (September 26, 2008 10:08 AM)                 Chief Complaint:  c/o bilateral ear pain and right ear congestion--missed ent appt because had resolved.  History of Present Illness: reviewed previous notes and phone notes pt did not see ENT becuase sxs resolved she now has 3 week hx of uri sxs started with cough, sinus congestion,  no fever or chills  has 2 kids under 3 both of whom have been sick (URI sxs), husband with sxs.   Past Medical History: psoriasis heart ablation Cholelithiasis  Past Surgical History: csection x 2 c section--requiring blood transfusion Cholecystectomy  Social History: Married Former Smoker Regular exercise-no Occupation: free lance for [employer]  Family History: mother alcohol/smoker Family History of Alcoholism/Addiction father alive and well no other complaints in a complete ROS     Prior Medication List:  ALPRAZOLAM 0.25 MG TABS (ALPRAZOLAM) once daily as needed CITALOPRAM HYDROBROMIDE 20 MG TABS (CITALOPRAM HYDROBROMIDE) once daily   Current Allergies (reviewed today): No known allergies       Physical Exam  General:     Well-developed,well-nourished,in no acute distress; alert,appropriate and cooperative throughout examination Head:     Normocephalic and atraumatic without obvious abnormalities. No apparent alopecia or balding. Eyes:     pupils equal and pupils round.   Nose:     External nasal examination shows no deformity or inflammation. Nasal mucosa are pink and moist without lesions or exudates. Mouth:     Oral mucosa and oropharynx without lesions or exudates.  Teeth in good repair. Neck:     No deformities, masses, or tenderness noted. Lungs:     Normal respiratory effort,  chest expands symmetrically. Lungs are clear to auscultation, no crackles or wheezes. Heart:     Normal rate and regular rhythm. S1 and S2 normal without gallop, murmur, click, rub or other extra sounds.    Impression & Recommendations:  Problem # 1:  URI (ICD-465.9) no evidence of bacterial infection. call for any concerns, increased sxs, fever, persistence of sxs, wheeze, SOB.   Her updated medication list for this problem includes:    Allfen Dm 400-20 Mg Tabs (Dextromethorphan-guaifenesin) .Marland Kitchen... Take 1 tablet by mouth two times a day  fluticasone call if sxs persist   Complete Medication List: 1)  Alprazolam 0.25 Mg Tabs (Alprazolam) .... Once daily as needed 2)  Citalopram Hydrobromide 20 Mg Tabs (Citalopram hydrobromide) .... Once daily 3)  Allfen Dm 400-20 Mg Tabs (Dextromethorphan-guaifenesin) .... Take 1 tablet by mouth two times a day 4)  Fluticasone Propionate 50 Mcg/act Susp (Fluticasone propionate) .... 2 sprays each nostril once daily    Prescriptions: FLUTICASONE PROPIONATE 50 MCG/ACT  SUSP (FLUTICASONE PROPIONATE) 2 sprays each nostril once daily  #1 vial x 3   Entered and Authorized by:   Birdie Sons MD   Signed by:   Birdie Sons MD on 09/26/2008   Method used:   Handwritten   RxID:   [id number]

## 2010-10-19 NOTE — Progress Notes (Signed)
Summary: alprazolam refill  Phone Note From Pharmacy   Summary of Call: patient would like a refill of alprazolam is this okay to fill? Initial call taken by: Kern Reap CMA Duncan Dull),  July 14, 2009 1:58 PM  Follow-up for Phone Call        change directions to every other day #15/4 refills (15 pills to last one month) Follow-up by: Birdie Sons MD,  July 14, 2009 3:58 PM    New/Updated Medications: ALPRAZOLAM 0.25 MG TABS (ALPRAZOLAM) take one tab every other day as needed Prescriptions: ALPRAZOLAM 0.25 MG TABS (ALPRAZOLAM) take one tab every other day as needed  #15 x 4   Entered by:   Kern Reap CMA (AAMA)   Authorized by:   Birdie Sons MD   Signed by:   Kern Reap CMA (AAMA) on 07/14/2009   Method used:   Telephoned to ...       [facility]  [phone]* (retail)       [address] [place], [place]  [address]       Ph: [phone] or [phone]       Fax: [phone]   RxID:   [id number]

## 2010-10-19 NOTE — Progress Notes (Signed)
Summary: ent refferal  Phone Note Call from Patient   Caller: Patient Call For: Birdie Sons MD Reason for Call: Referral Details for Reason: referral Summary of Call: patient is aware of her strep test.  she would like to know the name of an ENT who you would recommend.   [phone] Initial call taken by: Kern Reap CMA,  March 02, 2009 12:23 PM  Follow-up for Phone Call        [facility]---any of the physicians should be fine Follow-up by: Birdie Sons MD,  March 02, 2009 4:37 PM  Additional Follow-up for Phone Call Additional follow up Details #1::        Phone Call Completed Additional Follow-up by: Kern Reap CMA,  March 02, 2009 4:48 PM

## 2010-10-19 NOTE — Progress Notes (Signed)
Summary: Questions re: Citalopram  Phone Note Call from Patient Call back at Home Phone [phone]   Caller: Patient Call For: Ottilie Wigglesworth Summary of Call: Pt called, wanting more information from Dr Cato Mulligan re: medication he prescribed yesterday.  She states she told Dr Cato Mulligan she did not want an anti-depressant.  She picked up the Citalopram and all the insofmation says it's for depression.  She thought Dr Cato Mulligan told her it would be an anti-anxiety.   Initial call taken by: Sid Falcon LPN,  July 12, 2007 9:20 AM  Follow-up for Phone Call        all long term antianxiety meds are also antidepressants. Citalopram is indicated for both conditions. It is an excellent long term antianxiety medication Follow-up by: Birdie Sons MD,  July 12, 2007 11:23 AM  Additional Follow-up for Phone Call Additional follow up Details #1::        pt informed and she voiced her understanding and she will try the medicine. Additional Follow-up by: Sid Falcon LPN,  July 13, 2007 8:33 AM

## 2010-10-19 NOTE — Assessment & Plan Note (Signed)
Summary: MED CHECK/REFILLS/CJR---PT RSC (BMP) // RS/PTS SPOUSE R-Rm 13   Vital Signs:  Patient profile:   45 year old female Height:      64.75 inches Weight:      167 pounds BMI:     28.11 Temp:     98.4 degrees F oral Pulse rate:   72 / minute Pulse rhythm:   regular Resp:     16 per minute BP sitting:   110 / 84  (right arm) Cuff size:   regular  Vitals Entered By: Mervin Kung CMA Duncan Dull) (June 30, 2010 9:46 AM) CC: Rm 13  Pt would like to discuss questions about Traxodone. Pt would like to have hearing tested; has noticed decrease in hearing x yrs. Is Patient Diabetic? No Pain Assessment Patient in pain? no      Comments Pt state she is not taking Trazodone as it didn't seem to help. All other med doses and directions are correct. Nicki Guadalajara Fergerson CMA Duncan Dull)  June 30, 2010 9:52 AM    CC:  Rm 13  Pt would like to discuss questions about Traxodone. Pt would like to have hearing tested; has noticed decrease in hearing x yrs..  History of Present Illness: was treated for insomnia--- she says she really doesn't have insomnia she complains of anxiety.  she states that she takes a 1/2 xanax at night---"makes me feel better" admits to stressful job, sole bread winner, 2 kids.  she states that xanax and melatonin let her sleep through the night.   Current Problems (verified): 1)  Insomnia-sleep Disorder-unspec  (ICD-780.52) 2)  Anxiety State Nec  (ICD-300.09) 3)  Well Adult Exam  (ICD-V70.0) 4)  Cholelithiasis  (ICD-574.20) 5)  Psoriasis  (ICD-696.1)  Current Medications (verified): 1)  Citalopram Hydrobromide 20 Mg Tabs (Citalopram Hydrobromide) .... Once Daily 2)  Alprazolam 0.25 Mg Tabs (Alprazolam) .... Take One Tablet By Mouth Every Other Day As Needed  Allergies (verified): No Known Drug Allergies   Impression & Recommendations:  Problem # 1:  ANXIETY STATE NEC (ICD-300.09) discussed at length with pt and husband I'm ok with low dose daily  alprazolam total time 16 minutes all FTF The following medications were removed from the medication list:    Trazodone Hcl 50 Mg Tabs (Trazodone hcl) .Marland Kitchen... 1/2-1 by mouth at bedtime as needed insomnia Her updated medication list for this problem includes:    Citalopram Hydrobromide 20 Mg Tabs (Citalopram hydrobromide) ..... Once daily    Alprazolam 0.25 Mg Tabs (Alprazolam) .Marland Kitchen... Take one tablet by mouth every other day as needed  Complete Medication List: 1)  Citalopram Hydrobromide 20 Mg Tabs (Citalopram hydrobromide) .... Once daily 2)  Alprazolam 0.25 Mg Tabs (Alprazolam) .... Take one tablet by mouth every other day as needed  Current Allergies (reviewed today): No known allergies

## 2010-10-19 NOTE — Assessment & Plan Note (Signed)
Summary: st/njr   Vital Signs:  Patient Profile:   45 Years Old Female Height:     65 inches Temp:     98.4 degrees F Pulse rate:   60 / minute BP sitting:   100 / 76  (left arm)  Vitals Entered By: Gladis Riffle, RN (July 30, 2008 11:48 AM)                 Chief Complaint:  c/o sore throat x 3 weeks, saw Dr Kirtland Bouchard 2 weeks ago and neg strep--states soreness is further down throat and hurts to swallow, and URI symptoms.  History of Present Illness:  URI Symptoms      This is a 45 year old woman who presents with URI symptoms.  The patient reports sore throat, but denies nasal congestion, clear nasal discharge, purulent nasal discharge, dry cough, productive cough, earache, and sick contacts.  The patient denies fever, stiff neck, and dyspnea.  The patient denies itchy watery eyes, itchy throat, sneezing, seasonal symptoms, response to antihistamine, headache, muscle aches, and severe fatigue.  The patient denies the following risk factors for Strep sinusitis: unilateral facial pain, unilateral nasal discharge, poor response to decongestant, double sickening, tooth pain, Strep exposure, tender adenopathy, and absence of cough.  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[employer]  Family History: mother alcohol/smoker Family History of Alcoholism/Addiction father alive and well no other complaints in a complete ROS     Updated Prior Medication List: ALPRAZOLAM 0.25 MG TABS (ALPRAZOLAM) once daily as needed CITALOPRAM HYDROBROMIDE 20 MG TABS (CITALOPRAM HYDROBROMIDE) once daily  Current Allergies (reviewed today): No known allergies        Physical Exam  General:     Well-developed,well-nourished,in no acute distress; alert,appropriate and cooperative throughout examination Head:     Normocephalic and atraumatic without obvious abnormalities. No apparent alopecia or balding. Ears:     External ear exam shows no significant lesions or deformities.  Otoscopic examination reveals clear canals, tympanic membranes are intact bilaterally without bulging, retraction, inflammation or discharge. Hearing is grossly normal bilaterally. Mouth:     Oral mucosa and oropharynx without lesions or exudates.  Teeth in good repair. Neck:     No deformities, masses, or tenderness noted. Lungs:     Normal respiratory effort, chest expands symmetrically. Lungs are clear to auscultation, no crackles or wheezes.    Impression & Recommendations:  Problem # 1:  URI (ICD-465.9) ? cause likely viral  could be GERD as cause of ST The following medications were removed from the medication list:    Hydrocodone-homatropine 5-1.5 Mg/10ml Syrp (Hydrocodone-homatropine) .Marland Kitchen... 1 teaspoon every 6 hours as needed for cough  Orders: T-Culture, Rapid Strep ([id number])   Complete Medication List: 1)  Alprazolam 0.25 Mg Tabs (Alprazolam) .... Once daily as needed 2)  Citalopram Hydrobromide 20 Mg Tabs (Citalopram hydrobromide) .... Once daily    ]

## 2010-10-19 NOTE — Assessment & Plan Note (Signed)
Summary: Throat Culture for strep/cjr   Vital Signs:  Patient profile:   45 year old female Weight:      157 pounds Temp:     99.1 degrees F oral BP sitting:   102 / 74  (left arm) Cuff size:   regular  Vitals Entered By: Kern Reap CMA (February 24, 2009 11:33 AM)  Reason for Visit strep throat  History of Present Illness: Sara West is a 45 year old, married female, G2, P66, with a 45-year-old and a 69-year-old at home  and works full-time.  Originally from [place], [place], who comes in today for evaluation of recurrent sore throats.  This is the third sore, throat.  She's had in the past 8 months.  The first was in October was treated with antibiotics.  It resolved.  The second she states she is rapid strep was negative, but the culture turned out to be positive.  She is a treated with amoxicillin for 10 days.  It resolved, but now is back.  This time.  There was an interval about only 3 weeks between strep infections.  She's had no fever, chills, earache, etc. no skin rash.  She's not been in contact with anybody.  This had strep throat.  Her two children have been well.  Allergies (verified): No Known Drug Allergies  Past History:  Past medical, surgical, family and social histories (including risk factors) reviewed, and no changes noted (except as noted below).  Past Medical History: Reviewed history from 01/10/2009 and no changes required. psoriasis heart ablation Cholelithiasis frequent pharyngitis   Past Surgical History: Reviewed history from 05/22/2007 and no changes required. csection x 2 c section--requiring blood transfusion Cholecystectomy  Family History: Reviewed history from 05/22/2007 and no changes required. mother alcohol/smoker Family History of Alcoholism/Addiction father alive and well  Social History: Reviewed history from 07/11/2007 and no changes required. Married Former Smoker Regular exercise-no Occupation: free lance for [employer]  Review of Systems      See HPI  Physical Exam  General:  Well-developed,well-nourished,in no acute distress; alert,appropriate and cooperative throughout examination Head:  Normocephalic and atraumatic without obvious abnormalities. No apparent alopecia or balding. Eyes:  No corneal or conjunctival inflammation noted. EOMI. Perrla. Funduscopic exam benign, without hemorrhages, exudates or papilledema. Vision grossly normal. Ears:  External ear exam shows no significant lesions or deformities.  Otoscopic examination reveals clear canals, tympanic membranes are intact bilaterally without bulging, retraction, inflammation or discharge. Hearing is grossly normal bilaterally. Nose:  External nasal examination shows no deformity or inflammation. Nasal mucosa are pink and moist without lesions or exudates. Mouth:  oral cavity normal.  She has cryptic  tonsils with multiple pus pockets Neck:  No deformities, masses, or tenderness noted. Lungs:  Normal respiratory effort, chest expands symmetrically. Lungs are clear to auscultation, no crackles or wheezes.   Impression & Recommendations:  Problem # 1:  TONSILLITIS (ICD-463) Assessment Deteriorated  Orders: Prescription Created Electronically [phone])  Complete Medication List: 1)  Alprazolam 0.25 Mg Tabs (Alprazolam) .... Once daily as needed 2)  Citalopram Hydrobromide 20 Mg Tabs (Citalopram hydrobromide) .... Once daily 3)  Keflex 500 Mg Caps (Cephalexin) .... 2 by mouth two times a day x 10 days, then 1 at bedtime x 3 weeks  Other Orders: Rapid Strep (95621) T-Culture, Throat (30865-78469)  Patient Instructions: 1)  take Keflex, two tabs twice a day for one week, then one tablet at bedtime for 3 weeks.  Hopefully, this will eradicate your tonsillitis.  If not,  the next step would be to see an ENT to consider a tonsillectomy Prescriptions: KEFLEX 500 MG CAPS (CEPHALEXIN) 2 by mouth two times a day x 10 days, then 1 at bedtime  x 3 weeks  #40 x 0   Entered and Authorized by:   Roderick Pee MD   Signed by:   Roderick Pee MD on 02/24/2009   Method used:   [facility] to        [facility]  [phone]* (retail)       [address] [place], [place]  [address]       Ph: [phone] or [phone]       Fax: [phone]   RxID:   [id number]   Laboratory Results  Date/Time Received: February 24, 2009 11:45 AM   Other Tests  Rapid Strep: negative Comments: Kern Reap CMA  February 24, 2009 11:45 AM

## 2010-10-19 NOTE — Assessment & Plan Note (Signed)
Summary: CPX/PAP/CJR   Vital Signs:  Patient profile:   45 year old female Height:      64.75 inches Weight:      162 pounds BMI:     27.26 Temp:     98.9 degrees F oral BP sitting:   110 / 70  (left arm) Cuff size:   regular  Vitals Entered By: Kern Reap CMA Duncan Dull) (December 10, 2009 9:16 AM)  Nutrition Counseling: Patient's BMI is greater than 25 and therefore counseled on weight management options. CC: yearly physical Is Patient Diabetic? No Pain Assessment Patient in pain? no        CC:  yearly physical.  History of Present Illness: CPX  Current Problems (verified): 1)  Anxiety State Nec  (ICD-300.09) 2)  Well Adult Exam  (ICD-V70.0) 3)  Cholelithiasis  (ICD-574.20) 4)  Psoriasis  (ICD-696.1)  Allergies (verified): No Known Drug Allergies  Past History:  Family History: Last updated: 05/22/2007 mother alcohol/smoker Family History of Alcoholism/Addiction father alive and well  Social History: Last updated: 07/11/2007 Married Former Smoker Regular exercise-no Occupation: free lance for [employer]  Risk Factors: Exercise: no (05/22/2007)  Risk Factors: Smoking Status: quit (05/22/2007) Packs/Day: 1/2 (04/24/2007)  Past Medical History: psoriasis heart ablation Cholelithiasis frequent pharyngitis  gestational DM  Past Surgical History: csection x 2 c section--requiring blood transfusion (BTL with second c-section) Cholecystectomy tonsillectomy--2010  Review of Systems       All other systems reviewed and were negative  occasional trouble sleeping (has been using alprazolam 1/2 every night)---she is able to fall asleep but wakes up early. ---ongoing for several months.  also has rash at groin for several months---worse with moisture.   she notes heavier periods (f/u dr Rosemary Holms is scheduled)  Physical Exam  General:  Well-developed,well-nourished,in no acute distress; alert,appropriate and cooperative throughout  examination Head:  normocephalic and atraumatic.   Eyes:  pupils equal and pupils round.   Ears:  R ear normal and L ear normal.   Nose:  no external deformity and no external erythema.   Neck:  No deformities, masses, or tenderness noted. Lungs:  normal respiratory effort and no intercostal retractions.   Heart:  normal rate and regular rhythm.   Abdomen:  soft and non-tender.   Msk:  No deformity or scoliosis noted of thoracic or lumbar spine.   Pulses:  R radial normal and L radial normal.   Extremities:  No clubbing, cyanosis, edema, or deformity noted with normal full range of motion of all joints.   Neurologic:  cranial nerves II-XII intact and gait normal.   Skin:  turgor normal and color normal.  except at left groin---with well demarcated, erythematous rash Cervical Nodes:  no anterior cervical adenopathy and no posterior cervical adenopathy.   Inguinal Nodes:  no R inguinal adenopathy and no L inguinal adenopathy.   Psych:  normally interactive and good eye contact.     Impression & Recommendations:  Problem # 1:  WELL ADULT EXAM (ICD-V70.0) health maintenance UTD discussed need for weight loss advised diet, and daily vigorous exercise.  reviewed labs---note glucose (hx gestational dm)---needs to aggressively control weight  Problem # 2:  INSOMNIA-SLEEP DISORDER-UNSPEC (ICD-780.52) Assessment: New discussed at length---unclear etiology advised stopping alprazolam---dependencce discussed can try trazodone---see meds side effects discussed  Problem # 3:  PSORIASIS (ICD-696.1) asymptomatic  Problem # 4:  TINEA CRURIS (ICD-110.3) Assessment: New see meds will improve with weight loss side effects discussed  Complete Medication List: 1)  Citalopram Hydrobromide 20  Mg Tabs (Citalopram hydrobromide) .... Once daily 2)  Trazodone Hcl 50 Mg Tabs (Trazodone hcl) .... 1/2-1 by mouth at bedtime as needed insomnia 3)  Nystatin-triamcinolone 100000-0.1 Unit/gm-% Crea  (Nystatin-triamcinolone) .... Apply two times a day to affected area  Other Orders: Tdap => 75yrs IM ([address]) Admin 1st Vaccine (60454)  Patient Instructions: 1)  . Prescriptions: NYSTATIN-TRIAMCINOLONE 100000-0.1 UNIT/GM-% CREA (NYSTATIN-TRIAMCINOLONE) apply two times a day to affected area  #30 grams x 1   Entered and Authorized by:   Birdie Sons MD   Signed by:   Birdie Sons MD on 12/10/2009   Method used:   [facility]ically to        [facility]  [phone]* (retail)       [address] [place], [place]  [address]       Ph: [phone] or [phone]       Fax: [phone]   RxID:   [id number] TRAZODONE HCL 50 MG TABS (TRAZODONE HCL) 1/2-1 by mouth at bedtime as needed insomnia  #30 x 1   Entered and Authorized by:   Birdie Sons MD   Signed by:   Birdie Sons MD on 12/10/2009   Method used:   Electronically to        [facility]  [phone]* (retail)       [address] [place], [place]  [address]       Ph: [phone] or [phone]       Fax: [phone]   RxID:   [id number]    Immunizations Administered:  Tetanus Vaccine:    Vaccine Type: Tdap    Site: left deltoid    Mfr: GlaxoSmithKline    Dose: 0.5 ml    Route: IM    Given by: Kern Reap CMA (AAMA)    Exp. Date: 07/15/2010    Lot #: [id number]    Physician counseled: yes

## 2010-10-19 NOTE — Progress Notes (Signed)
Summary: pt requesting antibiotic for strep  Phone Note Call from Patient Call back at Work Phone [phone] Call back at [phone] cell   Caller: Patient Call For: Birdie Sons MD Summary of Call: Pt called and said that she believes she has strep throat again and was wondering if Dr. Cato Mulligan could call something in for her. [facility] [address]. She is requesting an antibiotic.  Initial call taken by: Lucy Antigua,  February 23, 2009 4:18 PM  Follow-up for Phone Call        strep screen and culture at lab Follow-up by: Birdie Sons MD,  February 24, 2009 6:19 AM  Additional Follow-up for Phone Call Additional follow up Details #1::        husband states she saw  Dr Tawanna Cooler today and this has been taken care of.  Additional Follow-up by: Gladis Riffle, RN,  February 24, 2009 2:35 PM

## 2010-10-19 NOTE — Progress Notes (Signed)
Summary: throat culture  Phone Note Call from Patient   Caller: Patient Call For: Dr. Tawanna Cooler Summary of Call: Pt wants to know results of throat culture, please. [phone] Initial call taken by: Lynann Beaver CMA,  April 17, 2009 2:41 PM  Follow-up for Phone Call        it grew out a strep, but a non-group A beta-hemolytic which is the type of strep that causes, rheumatic fever.  Recommended finishing  atb  consult with ENT ASAP Follow-up by: Roderick Pee MD,  April 17, 2009 3:58 PM  Additional Follow-up for Phone Call Additional follow up Details #1::        patient is aware and lab results faxed to Dr Ezzard Standing Additional Follow-up by: Kern Reap CMA,  April 17, 2009 5:23 PM  New Problems: GROUP A STREPTOCOCCUS INFECTION (ICD-041.01)   New Problems: GROUP A STREPTOCOCCUS INFECTION (ICD-041.01)   Pt has already seen Dr. Ezzard Standing today, but he did not know the culture results, and want to do a tonsillectomy.  Please call the patient, and speak to her personally, Dr. Tawanna Cooler.

## 2010-10-19 NOTE — Assessment & Plan Note (Signed)
Summary: cpx/jnl pt will come in fasting/njr   Vital Signs:  Patient Profile:   45 Years Old Female Height:     65 inches Weight:      148 pounds Temp:     98.2 degrees F oral Pulse rate:   72 / minute Pulse rhythm:   regular Resp:     12 per minute BP sitting:   106 / 70  Vitals Entered By: Lynann Beaver CMA (May 22, 2007 9:54 AM)                 Chief Complaint:  cpx.  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 Taking care of two children. " is there something I can take when I'm overwhelmed"   Current Allergies: No known allergies   Past Medical History:    psoriasis    heart ablation    Cholelithiasis  Past Surgical History:    csection x 2    c section--requiring blood transfusion    Cholecystectomy   Family History:    mother alcohol/smoker    Family History of Alcoholism/Addiction    father alive and well  Social History:    Married    Former Smoker    Regular exercise-no   Risk Factors:  Tobacco use:  quit Exercise:  no   Review of Systems       no other complaints   Physical Exam  General:     Well-developed,well-nourished,in no acute distress; alert,appropriate and cooperative throughout examination Head:     Normocephalic and atraumatic without obvious abnormalities. No apparent alopecia or balding. Eyes:     No corneal or conjunctival inflammation noted. EOMI. Perrla. Funduscopic exam benign, without hemorrhages, exudates or papilledema. Vision grossly normal. Ears:     External ear exam shows no significant lesions or deformities.  Otoscopic examination reveals clear canals, tympanic membranes are intact bilaterally without bulging, retraction, inflammation or discharge. Hearing is grossly normal bilaterally. Nose:     External nasal examination shows no deformity or inflammation. Nasal mucosa are pink and moist without lesions or exudates. Mouth:     Oral mucosa and oropharynx without  lesions or exudates.  Teeth in good repair. Neck:     No deformities, masses, or tenderness noted. Chest Wall:     No deformities, masses, or tenderness noted. Lungs:     Normal respiratory effort, chest expands symmetrically. Lungs are clear to auscultation, no crackles or wheezes. Heart:     Normal rate and regular rhythm. S1 and S2 normal without gallop, murmur, click, rub or other extra sounds. Abdomen:     Bowel sounds positive,abdomen soft and non-tender without masses, organomegaly or hernias noted. Msk:     No deformity or scoliosis noted of thoracic or lumbar spine.   Extremities:     No clubbing, cyanosis, edema, or deformity noted with normal full range of motion of all joints.   Neurologic:     No cranial nerve deficits noted. Station and gait are normal. Plantar reflexes are down-going bilaterally. DTRs are symmetrical throughout. Sensory, motor and coordinative functions appear intact. Skin:     Intact without suspicious lesions or rashes Cervical Nodes:     No lymphadenopathy noted Axillary Nodes:     No palpable lymphadenopathy Inguinal Nodes:     No significant adenopathy Psych:     Cognition and judgment appear intact. Alert and cooperative with normal attention span and concentration. No apparent delusions, illusions, hallucinations  Impression & Recommendations:  Problem # 1:  WELL ADULT EXAM (ICD-V70.0)  Orders: TLB-Lipid Panel (80061-LIPID) TLB-BMP (Basic Metabolic Panel-BMET) (80048-METABOL) TLB-CBC Platelet - w/Differential (85025-CBCD) TLB-Hepatic/Liver Function Pnl (80076-HEPATIC) TLB-TSH (Thyroid Stimulating Hormone) (84443-TSH)   Problem # 2:  ANXIETY STATE NEC (ICD-300.09)  Her updated medication list for this problem includes:    Alprazolam 0.25 Mg Tabs (Alprazolam) ..... Once daily as needed  foot pain--one week. no significant abnormality except it is tender.   Complete Medication List: 1)  Alprazolam 0.25 Mg Tabs (Alprazolam) ....  Once daily as needed     Prescriptions: ALPRAZOLAM 0.25 MG TABS (ALPRAZOLAM) once daily as needed  #20 x 1   Entered and Authorized by:   Birdie Sons MD   Signed by:   Birdie Sons MD on 05/22/2007   Method used:   Print then Give to Patient   RxID:   [id number]   Appended Document: cpx/jnl pt will come in fasting/njr  Laboratory Results   Urine Tests  Date/Time Recieved: May 22, 2007 2:21 PM  Date/Time Reported: May 22, 2007 2:21 PM   Routine Urinalysis   Color: yellow Appearance: Clear Glucose: negative   (Normal Range: Negative) Bilirubin: negative   (Normal Range: Negative) Ketone: negative   (Normal Range: Negative) Spec. Gravity: 1.020   (Normal Range: 1.003-1.035) Blood: negative   (Normal Range: Negative) pH: 5.5   (Normal Range: 5.0-8.0) Protein: negative   (Normal Range: Negative) Urobilinogen: negative   (Normal Range: 0-1) Nitrite: negative   (Normal Range: Negative) Leukocyte Esterace: negative   (Normal Range: Negative)    Comments: ...................................................................Milica Zimonjic  May 22, 2007 2:21 PM

## 2010-10-19 NOTE — Assessment & Plan Note (Signed)
Summary: Pt using more Alprazolam than expected/nn   Vital Signs:  Patient Profile:   45 Years Old Female Height:     65 inches Weight:      154 pounds Temp:     98.2 degrees F oral BP sitting:   106 / 60  Vitals Entered By: Lynann Beaver CMA (July 11, 2007 9:24 AM)                 Chief Complaint:  to discuss alprazolam dosage.  History of Present Illness: She has been using alprazolam daily---more than I have asked her to take. She likes the way the alprazolam makes her feel. "Keeps me on an even keel".  Lots of stressors: baby, money etc...  Acute Visit History:      The patient complains of sinus problems and sore throat.  She denies fever.  Other comments include: no fever.  There is no history of dysphagia, drooling, cold/URI symptoms, or recent exposure to strep associated with the sore throat.        She complains of sinus pressure, ears being blocked, and nasal congestion.  She denies a previous history of sinusitis.         Current Allergies: No known allergies   Past Medical History:    Reviewed history from 05/22/2007 and no changes required:       psoriasis       heart ablation       Cholelithiasis  Past Surgical History:    Reviewed history from 05/22/2007 and no changes required:       csection x 2       c section--requiring blood transfusion       Cholecystectomy   Family History:    Reviewed history from 05/22/2007 and no changes required:       mother alcohol/smoker       Family History of Alcoholism/Addiction       father alive and well  Social History:    Reviewed history from 05/22/2007 and no changes required:       Married       Former Smoker       Regular exercise-no       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   Eyes:     pupils equal and pupils round.   Ears:     R ear normal and L ear normal.   Nose:     no external erythema.   Neck:     No deformities, masses, or tenderness noted. Lungs:     Normal respiratory effort, chest expands symmetrically. Lungs are clear to auscultation, no crackles or wheezes. Heart:     Normal rate and regular rhythm. S1 and S2 normal without gallop, murmur, click, rub or other extra sounds.    Impression & Recommendations:  Problem # 1:  ANXIETY STATE NEC (ICD-300.09) discussed anxiety at length. She likes alprazolam but she is using it daily---discussed at length.  Trial citalopram Her updated medication list for this problem includes:    Alprazolam 0.25 Mg Tabs (Alprazolam) ..... Once daily as needed    Citalopram Hydrobromide 20 Mg Tabs (Citalopram hydrobromide) ..... Once daily  total time 25 minutes   Problem #  2:  URI (ICD-465.9) call for f/c/sob  Complete Medication List: 1)  Alprazolam 0.25 Mg Tabs (Alprazolam) .... Once daily as needed 2)  Citalopram Hydrobromide 20 Mg Tabs (Citalopram hydrobromide) .... Once daily   Patient Instructions: 1)  see me 6 weeks.     Prescriptions: CITALOPRAM HYDROBROMIDE 20 MG TABS (CITALOPRAM HYDROBROMIDE) once daily  #90 x 3   Entered and Authorized by:   Birdie Sons MD   Signed by:   Birdie Sons MD on 07/11/2007   Method used:   Electronically sent to ...       [facility]  [phone][facility]       [address]       [place], [place]  [address]       Ph: [phone] or [phone]       Fax: [phone]   RxID:   [id number]  ]

## 2010-10-19 NOTE — Assessment & Plan Note (Signed)
Summary: possible strep/jls   Vital Signs:  Patient Profile:   45 Years Old Female Height:     65 inches Weight:      155 pounds Temp:     99 degrees F oral BP sitting:   84 / 64  (left arm) Cuff size:   regular  Vitals Entered By: Raechel Ache, RN (July 18, 2008 4:17 PM)                 Chief Complaint:  C/o bad sore throat, ears hurt, and metallic taste in mouth. Had strep in May..    Current Allergies: No known allergies         Impression & Recommendations:  Problem # 1:  URI (ICD-465.9)  Her updated medication list for this problem includes:    Hydrocodone-homatropine 5-1.5 Mg/33ml Syrp (Hydrocodone-homatropine) .Marland Kitchen... 1 teaspoon every 6 hours as needed for cough   Complete Medication List: 1)  Alprazolam 0.25 Mg Tabs (Alprazolam) .... Once daily as needed 2)  Citalopram Hydrobromide 20 Mg Tabs (Citalopram hydrobromide) .... Once daily 3)  Hydrocodone-homatropine 5-1.5 Mg/67ml Syrp (Hydrocodone-homatropine) .Marland Kitchen.. 1 teaspoon every 6 hours as needed for cough  Other Orders: Rapid Strep ([address])   Patient Instructions: 1)  Drink as much fluid as you can tolerate for the next few days. 2)  Take 650-1000mg  of Tylenol every 4-6 hours as needed for relief of pain or comfort of fever AVOID taking more than 4000mg   in a 24 hour period (can cause liver damage in higher doses).   Prescriptions: HYDROCODONE-HOMATROPINE 5-1.5 MG/5ML SYRP (HYDROCODONE-HOMATROPINE) 1 teaspoon every 6 hours as needed for cough  #6 oz x 0   Entered and Authorized by:   Gordy Savers  MD   Signed by:   Gordy Savers  MD on 07/18/2008   Method used:   Print then Give to Patient   RxID:   [id number]  ] Laboratory Results   Date/Time Reported: July 18, 2008 4:44 PM   Other Tests  Rapid Strep: negative Comments Wynona Canes, CMA  July 18, 2008 4:44 PM

## 2010-10-19 NOTE — Assessment & Plan Note (Signed)
Summary: sore throat/headache   Vital Signs:  Patient profile:   45 year old female Weight:      156 pounds Temp:     98.5 degrees F oral Pulse rate:   86 / minute BP sitting:   92 / 60  Vitals Entered By: Lynann Beaver CMA (January 12, 2009 11:10 AM) CC: sore throat, URI symptoms Is Patient Diabetic? No Pain Assessment Patient in pain? yes     Location: throat   CC:  sore throat and URI symptoms.  History of Present Illness:  URI Symptoms      This is a 45 year old woman who presents with URI symptoms.  The patient reports sore throat, but denies nasal congestion.  The patient denies fever and stiff neck.  The patient denies itchy watery eyes.  The patient denies the following risk factors for Strep sinusitis: unilateral facial pain and Strep exposure.   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  no sinus tenderness  Eyes:  vision grossly intact, pupils equal, pupils round, pupils reactive to light, and no injection.   Ears:  TMs clear bilat  Nose:  nares slt boggy but clear  Mouth:  enlarged R tonsil with crypts/debris , poss scant exudate , also erythema  mild post pharyngeal erythema  Neck:  No deformities, masses, or tenderness noted. Lungs:  Normal respiratory effort, chest expands symmetrically. Lungs are clear to auscultation, no crackles or wheezes.   Impression & Recommendations:  Problem # 1:  TONSILLITIS (ICD-463)  Orders: Rapid Strep (17616)  discussed no evidence of strep---culture could have MONO but doubt most likely viral syndrome which will self resolve  Complete Medication List: 1)  Alprazolam 0.25 Mg Tabs (Alprazolam) .... Once daily as needed 2)  Citalopram Hydrobromide 20 Mg Tabs (Citalopram hydrobromide) .... Once daily 3)  Amoxicillin 500 Mg Tabs (Amoxicillin) .Marland Kitchen.. 1 by mouth three times a day for 10 days  Laboratory Results    Other Tests  Rapid Strep: negative Comments: Joanne Chars CMA  January 12, 2009 11:48 AM

## 2010-10-19 NOTE — Progress Notes (Signed)
Summary: referral to ENT per Dr. Cato Mulligan  Phone Note Call from Patient Call back at [phone]   Caller: pt live Call For: Swords Summary of Call: Patient still has a sore throat what do you want her to do.  [facility] Initial call taken by: Celine Ahr,  August 04, 2008 10:01 AM  Follow-up for Phone Call        Pt. is still having symptoms of sore throat that never really resolves.  Any suggestions? Follow-up by: Lynann Beaver CMA,  August 04, 2008 10:08 AM  Additional Follow-up for Phone Call Additional follow up Details #1::        refer ENT  Left message on pt's personal voice mail that the [facility] will call her back with ENT referral. Lynann Beaver [facility]  August 04, 2008 12:48 PM Additional Follow-up by: Birdie Sons MD,  August 04, 2008 11:41 AM    Additional Follow-up for Phone Call Additional follow up Details #2::    Referral Appointment Information Day/Date:12/03 Time:4:20 Place/MD:Dr. Jenne Pane Address:[address] [place] Phone/Fax:[phone] Left message to return call  Follow-up by: Florentina Addison,  August 05, 2008 4:01 PM

## 2010-10-19 NOTE — Assessment & Plan Note (Signed)
Summary: strep throat/mhf   Vital Signs:  Patient profile:   45 year old female Weight:      162 pounds Temp:     98.7 degrees F oral BP sitting:   110 / 78  (left arm) Cuff size:   regular  Vitals Entered By: Kern Reap CMA (April 13, 2009 2:40 PM)  Reason for Visit strep throat  History of Present Illness: jaidyn is a 45 year old female patient of Dr. Smitty Cords comes in today for evaluation of two problems.  Saturday a.m. she awoke with a severe sore throat.  When she was here last we put her on a therapeutic dose of Keflex for 10 days and then 3 weeks worth of prophylaxis.  The sore throat went away.  She felt well had no side effects, and the ATB.    Now the sore throat is   Back.  We discussed ENT consultation to evaluate her for a tonsillectomy because of recurrent bacterial sore throats and now she appears to have a right unilateral tonsillar cellulitis.  She also has a lesion on her left hand.  She would like to be removed.  I explained to her that Dr. Smitty Cords also did this however, she states she wants off today  rapid strep was negative, but with recurrence.  I would recommend she see Dr. Ezzard Standing to consider tonsillectomy  Allergies (verified): No Known Drug Allergies  Past History:  Past medical, surgical, family and social histories (including risk factors) reviewed, and no changes noted (except as noted below).  Past Medical History: Reviewed history from 01/10/2009 and no changes required. psoriasis heart ablation Cholelithiasis frequent pharyngitis   Past Surgical History: Reviewed history from 05/22/2007 and no changes required. csection x 2 c section--requiring blood transfusion Cholecystectomy  Family History: Reviewed history from 05/22/2007 and no changes required. mother alcohol/smoker Family History of Alcoholism/Addiction father alive and well  Social History: Reviewed history from 07/11/2007 and no changes required. Married Former Smoker  Regular exercise-no Occupation: free lance for [employer]  Review of Systems      See HPI  Physical Exam  General:  Well-developed,well-nourished,in no acute distress; alert,appropriate and cooperative throughout examination Head:  Normocephalic and atraumatic without obvious abnormalities. No apparent alopecia or balding. Eyes:  No corneal or conjunctival inflammation noted. EOMI. Perrla. Funduscopic exam benign, without hemorrhages, exudates or papilledema. Vision grossly normal. Ears:  External ear exam shows no significant lesions or deformities.  Otoscopic examination reveals clear canals, tympanic membranes are intact bilaterally without bulging, retraction, inflammation or discharge. Hearing is grossly normal bilaterally. Nose:  External nasal examination shows no deformity or inflammation. Nasal mucosa are pink and moist without lesions or exudates. Mouth:  left tonsil.  Normal right tonsil swollen, red and inflamed, consistent with an early peritonsillar cellulitis.  Also extremely tender lymphadenopathy on the right Skin:  elevated lesion left hand.  She was taken to the treatment room after informed consent, the lesion was anesthetized with 1% xylo , excised cauterized sent for evaluation   Impression & Recommendations:  Problem # 1:  TONSILLITIS (ICD-463) Assessment Deteriorated  Problem # 2:  ACTINIC KERATOSIS (ICD-702.0) Assessment: New  Orders: Excise lesion (TAL) 0.6 - 1.0 (11401)  Complete Medication List: 1)  Alprazolam 0.25 Mg Tabs (Alprazolam) .... Once daily as needed 2)  Citalopram Hydrobromide 20 Mg Tabs (Citalopram hydrobromide) .... Once daily 3)  Keflex 500 Mg Caps (Cephalexin) .... 2 by mouth two times a day  Other Orders: Rapid Strep (16109) Prescription Created Electronically (  Chance.Elder) T-Culture, Throat ([id number])  Patient Instructions: 1)  Keflex 500 mg, take two tablets twice a day.  Call Dr. Narda Bonds tomorrow.  ENT for  consultation Prescriptions: KEFLEX 500 MG CAPS (CEPHALEXIN) 2 by mouth two times a day  #50 x 1   Entered and Authorized by:   Roderick Pee MD   Signed by:   Roderick Pee MD on 04/13/2009   Method used:   [facility]        [facility]  [phone]* (retail)       [address] [place], [place]  [address]       Ph: [phone] or [phone]       Fax: [phone]   RxID:   [id number]   Laboratory Results    Other Tests  Rapid Strep: negative Comments: Throat culture sent. Joanne Chars CMA  April 13, 2009 3:23 PM

## 2010-10-21 ENCOUNTER — Ambulatory Visit (INDEPENDENT_AMBULATORY_CARE_PROVIDER_SITE_OTHER): Payer: Managed Care, Other (non HMO) | Admitting: Internal Medicine

## 2010-10-21 ENCOUNTER — Encounter: Payer: Self-pay | Admitting: Internal Medicine

## 2010-10-21 VITALS — BP 104/76 | HR 72 | Temp 98.8°F | Ht 65.0 in | Wt 168.0 lb

## 2010-10-21 DIAGNOSIS — J069 Acute upper respiratory infection, unspecified: Secondary | ICD-10-CM

## 2010-10-21 MED ORDER — HYDROCODONE-GUAIFENESIN 5-100 MG/5ML PO SYRP: 5.0000 mL | ORAL_SOLUTION | Freq: Four times a day (QID) | ORAL | Status: AC | PRN

## 2010-10-21 MED ORDER — ALBUTEROL SULFATE HFA 108 (90 BASE) MCG/ACT IN AERS: 2.0000 | INHALATION_SPRAY | Freq: Four times a day (QID) | RESPIRATORY_TRACT | Status: DC | PRN

## 2010-10-21 NOTE — Progress Notes (Signed)
  Subjective:     Sara West is a 45 y.o. female who presents for evaluation of symptoms of a URI. Symptoms include wheezing and cough. Onset of symptoms was 5 days ago, and has been unchanged since that time. Treatment to date: antibiotics.  The following portions of the patient's history were reviewed and updated as appropriate: allergies, current medications, past family history, past social history and problem list.  Review of Systems Pertinent items are noted in HPI.   No fever or chills No sob Night time cough  Objective:     NAD Chest with wheeze, no increased WOB CV: RRR  Assessment:    viral upper respiratory illness   Plan:    Discussed diagnosis and treatment of URI. Follow up as needed.  She has significant wheezing and will treat with bronchodilators mucinex dm hydromet SE discussed

## 2010-10-21 NOTE — Patient Instructions (Signed)
Place upper respiratory infection patient instructions here.  

## 2010-11-09 ENCOUNTER — Other Ambulatory Visit: Payer: Self-pay | Admitting: *Deleted

## 2010-11-09 DIAGNOSIS — F411 Generalized anxiety disorder: Secondary | ICD-10-CM

## 2010-11-09 MED ORDER — ALPRAZOLAM 0.25 MG PO TABS: 0.2500 mg | ORAL_TABLET | ORAL | Status: DC

## 2010-12-27 ENCOUNTER — Other Ambulatory Visit (HOSPITAL_COMMUNITY): Payer: Self-pay | Admitting: Obstetrics and Gynecology

## 2010-12-27 DIAGNOSIS — Z1231 Encounter for screening mammogram for malignant neoplasm of breast: Secondary | ICD-10-CM

## 2011-01-06 ENCOUNTER — Ambulatory Visit (INDEPENDENT_AMBULATORY_CARE_PROVIDER_SITE_OTHER): Payer: Managed Care, Other (non HMO) | Admitting: Internal Medicine

## 2011-01-06 ENCOUNTER — Encounter: Payer: Self-pay | Admitting: Internal Medicine

## 2011-01-06 VITALS — BP 102/76 | HR 101 | Ht 65.0 in | Wt 160.0 lb

## 2011-01-06 DIAGNOSIS — J40 Bronchitis, not specified as acute or chronic: Secondary | ICD-10-CM

## 2011-01-06 DIAGNOSIS — R002 Palpitations: Secondary | ICD-10-CM

## 2011-01-06 MED ORDER — DOXYCYCLINE HYCLATE 100 MG PO TABS: 100.0000 mg | ORAL_TABLET | Freq: Two times a day (BID) | ORAL | Status: AC

## 2011-01-06 MED ORDER — HYDROCODONE-GUAIFENESIN 5-100 MG/5ML PO SYRP: 5.0000 mL | ORAL_SOLUTION | Freq: Three times a day (TID) | ORAL | Status: AC | PRN

## 2011-01-08 ENCOUNTER — Other Ambulatory Visit: Payer: Self-pay | Admitting: Internal Medicine

## 2011-01-09 ENCOUNTER — Encounter: Payer: Self-pay | Admitting: Internal Medicine

## 2011-01-09 DIAGNOSIS — R002 Palpitations: Secondary | ICD-10-CM | POA: Insufficient documentation

## 2011-01-09 DIAGNOSIS — J209 Acute bronchitis, unspecified: Secondary | ICD-10-CM | POA: Insufficient documentation

## 2011-01-09 NOTE — Assessment & Plan Note (Signed)
Infrequent and minimally symptomatic. EKG obtained demonstrates normal sinus rhythm with normal intervals and axis. No evidence of acute ischemic change. Rhythm strip obtained for several minutes and noted two isolated PVCs. No evidence of ventricular tachycardia. Likely precipitated by infection. Consider magnesium oxide one daily. followup if no improvement or worsening.

## 2011-01-09 NOTE — Assessment & Plan Note (Signed)
Begin doxycycline x10 days. Given hydrocodone-based cough syrup. Cautioned regarding possible sedating effect. Followup if no improvement or worsening.

## 2011-01-09 NOTE — Progress Notes (Signed)
  Subjective:    Patient ID: Sara West, female    DOB: 09-17-1966, 45 y.o.   MRN: [id number]  HPI Patient presents to clinic for evaluation of cough. Notes one-week history of cough with associated chest congestion laryngitis and green nasal discharge.Cough is worse at night. Denies fever chills shortness of breath or wheezing.Denies sick exposure.o alleviating or exacerbating factors.Taking over-the-counter Mucinex without significant improvement. Notes intermittent skipping of heart without associated chest pain, presyncope dizziness or syncope. Recalls history of ablation in the past for arrhythmia.  Reviewed past medical history, medications and allergies   Review of Systems see history of present illness     Objective:   Physical Exam    Physical Exam  Vitals reviewed. Constitutional:  appears well-developed and well-nourished. No distress.  HENT:  Head: Normocephalic and atraumatic.  Right Ear: Tympanic membrane, external ear and ear canal normal.  Left Ear: Tympanic membrane, external ear and ear canal normal.  Nose: Nose normal.  Mouth/Throat: Oropharynx is clear and moist. No oropharyngeal exudate.  Eyes: Conjunctivae and EOM are normal. Pupils are equal, round, and reactive to light. Right eye exhibits no discharge. Left eye exhibits no discharge. No scleral icterus.  Neck: Neck supple. No thyromegaly present.  Cardiovascular:intermittently irregular.  Exam reveals no gallop and no friction rub.   No murmur heard. Pulmonary/Chest: Effort normal and breath sounds normal. No respiratory distress.  has no wheezes.  has no rales.  Lymphadenopathy:   no cervical adenopathy.  Neurological:  is alert.  Skin: Skin is warm and dry.  not diaphoretic.  Psychiatric: normal mood and affect.      Assessment & Plan:

## 2011-01-25 ENCOUNTER — Ambulatory Visit (HOSPITAL_COMMUNITY)
Admission: RE | Admit: 2011-01-25 | Discharge: 2011-01-25 | Disposition: A | Discharge status: Home / Self Care | Payer: Managed Care, Other (non HMO) | Source: Ambulatory Visit | Attending: Obstetrics and Gynecology | Admitting: Obstetrics and Gynecology

## 2011-01-25 DIAGNOSIS — Z1231 Encounter for screening mammogram for malignant neoplasm of breast: Secondary | ICD-10-CM | POA: Insufficient documentation

## 2011-02-01 NOTE — Op Note (Signed)
NAMESHARILYNN, Sara West NO.:  [id number]   MEDICAL RECORD NO.:  [id number]          PATIENT TYPE:  INP   LOCATION:  9132                          [facility]ILITY:  [facility]   PHYSICIAN:  Lenoard Aden, M.D.DATE OF BIRTH:  08-09-1966   DATE OF PROCEDURE:  01/21/2007  DATE OF DISCHARGE:                               OPERATIVE REPORT   PREOPERATIVE DIAGNOSIS:  Previous C-section with spontaneous rupture of  membranes.   POSTOPERATIVE DIAGNOSES:  1. Previous C-section with spontaneous rupture of membranes.  2. Elective sterilization.   PROCEDURE:  Repeat low segment transverse cesarean section and tubal  ligation.   SURGEON:  Lenoard Aden, [name]   ASSISTANTEarlene Plater.   ANESTHESIA:  Spinal by Jean Rosenthal.   ESTIMATED BLOOD LOSS:  1000 mL.   COMPLICATIONS:  None.   DRAINS:  Foley.   COUNTS:  Correct and the patient in recovery in good condition.   FINDINGS:  Full-term living female.  Apgars 9 and 10.  Handed to pediatric  attendants.  No complications.  Normal tubes.  Normal ovaries.  Tubal  segments to pathology.  Single layer uterine closure.  Uterine atony  controlled with Methergine and increased IV dilute pitocin solution.   BRIEF OPERATIVE NOTE:  Being apprised of the risks of anesthesia,  infection, bleeding, intraabdominal injury, need for repair, __________  , complications to include bowel and bladder injury, failure risk of  tubal ligation 5-06/999.  Patient brought to the operating room and  anesthesia was administered, a spinal anesthetic without complications.  Prepped and draped in the usual sterile fashion.  Foley catheter placed.  After achieving adequate anesthesia, dilute Marcaine solution placed.  A  Pfannenstiel skin incision was made with the scalpel and carried down to  the fascia, which was opened in the midline and dissected sharply using  a scalpel.  The rectus diathesis is dissected sharply.  The peritoneum  entered sharply and bladder  blade placed.  Visceroperitoneum is scored  sharply off a very thin lower uterine segment with  lower uterine  segment window noted.  Kerr hysterotomy incision made.  Atraumatic  delivery.  Full-term living female.  __________ .  Apgars 9 and 10.  Cord  blood collected.  Placenta delivered manually.  Intact 3-vessel cord.  Uterus exteriorized and closed in one single running imbricating layer  after curetting using a dry lap pack.  Urine atony is dealt with as  previously noted.  Left tube traced out to the fimbriated end.  Ampullary isthmic portion of the tube is identified.  Avascular portion  of the mesosalpinx was cauterized, creating a window.  Zero plain ties  are placed proximally and distally.  Tubal segment excised.  Tubal  lumens visualized and cauterized.  The same procedure that is done on  the left tube is done on the right tube.  Both tubal segments are sent  to pathology.  After achieving adequate hemostasis, uterine incision was  re-inspected and found to be hemostatic.  Bladder flap hemostatic.  Irrigation accomplished.  Fascia then closed using a 0 Monocryl in  continuous running  fashion.  The skin was closed using staples.  Subcutaneous tissue previously closed using 0 plain in a continuous  running fashion.  The patient tolerated the procedure well and was  transferred to recovery in good condition.      Lenoard Aden, [name]  Electronically Signed     RJT/MEDQ  D:  01/21/2007  T:  01/21/2007  Job:  [id number]

## 2011-02-01 NOTE — Consult Note (Signed)
Sara West, Sara West NO.:  [id number]   MEDICAL RECORD NO.:  [id number]          PATIENT TYPE:  INP   LOCATION:  9374                          [facility]ILITY:  [facility]   PHYSICIAN:  Bevelyn Buckles. Champey, M.D.DATE OF BIRTH:  03-Jan-1966   DATE OF CONSULTATION:  01/23/2007  DATE OF DISCHARGE:                                 CONSULTATION   REQUESTING PHYSICIAN:  Dr. Cherly Hensen   REASON FOR CONSULT:  Headache.   HISTORY OF PRESENT ILLNESS:  Ms. Payer is a 45 year old G2, P2 Caucasian  female who is postpartum day 2 status post c-section with complication  of severe blood loss requiring transfusion times 2 units. Today, two  days postpartum developed severe headache. She described the headache as  right-sided sharp pain in a 10/10 intensity without any radiation. She  has no associated nausea, photophobia, or phonophobia with her  headaches. She states she feels like the room was in slow motion which  are similar symptoms that she had with prior v-tach episodes in the  past. She also has some chest tightness and shortness of breath with her  headache and felt slightly faint and lightheaded. She also complains of  decreased hearing on the right side. She denies any focal weakness,  numbness, dizziness, changes in speech, swallowing problems, chewing  problems, or loss of consciousness. Patient is currently in no pain just  given Dilaudid two hours ago. She is currently breast feeding.   PAST MEDICAL HISTORY:  1. Positive for a G2, P2, status post c-section 2 days ago with blood      loss status post transfusion.  2. V-tach status post ablation ten years ago.   MEDICATIONS:  Her medications include:  1. Stool softener.  2. Dilaudid.   ALLERGIES:  NO KNOWN DRUG ALLERGIES.   FAMILY HISTORY:  Positive colon cancer, brain aneurysm, diabetes.   SOCIAL HISTORY:  Patient denies any tobacco, alcohol or drug use.   REVIEW OF SYSTEMS:  Review of systems was positive as per HPI.  Review of  systems negative as per HPI in greater than seven other organ systems.   PHYSICAL EXAMINATION:  VITALS:  Temperature is 98.8. Blood pressure:  133/75. Pulse is 83. Respirations:  17. O2 sat is 95 to 97%.  HEENT:  Normocephalic, atraumatic. Extraocular muscles are intact.  Pupils equal, round, and react to light. Visual fields are intact. It  was difficult to view fundi with nondilated eyes.  NECK:  Supple.  HEART:  Regular.  LUNGS:  Clear.  ABDOMEN:  Soft.  EXTREMITIES:  Show good pulses.  NEUROLOGICAL:  Patient is awake, alert, and oriented.  Language is  fluent. Cranial nerves II-XII are grossly intact. It is difficult to  view fundi secondary to nondilated eyes. Motor:  Patient has good  strength in all four extremities.  Sensory examination is within normal  limits to light touch.  Reflexes are 2 to 3+ throughout and symmetric.  Cerebellar function is within normal limits finger to nose. Gait was not  assessed secondary to her safety.   LABORATORY DATA:  WBC is 7.8. Hemoglobin  8.5. Hematocrit is 24.4.  Platelets 243. Sodium is 138. Potassium is 4.1. Chloride is 104. CO2 is  28. BUN 5. Creatinine 0.53. Glucose 120. Calcium is 8.2. BNP is 171. CK  is 133. Troponin is 0.06. CT of the head was within normal limits.   IMPRESSION:  This person is a 45 year old Caucasian female post partum  day 2 with severe headaches which has improved with Dilaudid. CT of the  head was reviewed and was essentially normal. Concern of her being post  \partum and new onset headaches for possible hypercoagulable state. We  will check an MRI, MRA, and MRV in the morning. We will continue her on  her current p.r.n. pain meds which have been beneficial. She will  continue her cardiac evaluation. This patient did complain of chest  tightness and with her history of v-tach, we will followup the patient  in the morning.      Bevelyn Buckles. Nash Shearer, [name]  Electronically Signed     DRC/MEDQ  D:   01/23/2007  T:  01/24/2007  Job:  [id number]

## 2011-02-03 ENCOUNTER — Other Ambulatory Visit: Payer: Self-pay | Admitting: *Deleted

## 2011-02-03 DIAGNOSIS — F411 Generalized anxiety disorder: Secondary | ICD-10-CM

## 2011-02-03 MED ORDER — ALPRAZOLAM 0.25 MG PO TABS: 0.2500 mg | ORAL_TABLET | ORAL | Status: DC

## 2011-02-04 NOTE — Discharge Summary (Signed)
NAMEGREGORIA, Sara West NO.:  [id number]   MEDICAL RECORD NO.:  [id number]          PATIENT TYPE:  INP   LOCATION:  9374                          [facility]ILITY[facility]   PHYSICIAN:  Lenoard Aden, M.D.DATE OF BIRTH:  1965-11-08   DATE OF ADMISSION:  01/21/2007  DATE OF DISCHARGE:  01/25/2007                               DISCHARGE SUMMARY   The patient underwent an uncomplicated repeat C-section and tubal  ligation Jan 21, 2007.  Postoperative course uncomplicated.  Discharged  to home on day #3.  Discharge teaching done.  Discharge medications  given.  Follow up in the office in 4-6 weeks.  Tylox, prenatal vitamins  given.      Lenoard Aden, [name]  Electronically Signed     RJT/MEDQ  D:  02/25/2007  T:  02/26/2007  Job:  [id number]

## 2011-04-25 ENCOUNTER — Other Ambulatory Visit: Payer: Self-pay | Admitting: *Deleted

## 2011-04-25 ENCOUNTER — Ambulatory Visit (INDEPENDENT_AMBULATORY_CARE_PROVIDER_SITE_OTHER): Payer: Managed Care, Other (non HMO) | Admitting: Internal Medicine

## 2011-04-25 ENCOUNTER — Encounter: Payer: Self-pay | Admitting: Internal Medicine

## 2011-04-25 DIAGNOSIS — F411 Generalized anxiety disorder: Secondary | ICD-10-CM

## 2011-04-25 MED ORDER — LORAZEPAM 0.5 MG PO TABS: 0.5000 mg | ORAL_TABLET | Freq: Every day | ORAL | Status: DC | PRN

## 2011-04-25 MED ORDER — CITALOPRAM HYDROBROMIDE 40 MG PO TABS: 40.0000 mg | ORAL_TABLET | Freq: Every day | ORAL | Status: DC

## 2011-04-25 MED ORDER — ALPRAZOLAM 0.5 MG PO TABS: 0.5000 mg | ORAL_TABLET | Freq: Three times a day (TID) | ORAL | Status: DC | PRN

## 2011-04-25 NOTE — Progress Notes (Signed)
  Subjective:    Patient ID: Sara West, female    DOB: 21-Aug-1966, 45 y.o.   MRN: [id number]  HPI  A lot of stress at work. She is convinced that stress is the cause of abdominal pain and severe, intermittent headaches. She has no other concerns.  Pt admits to some sxs similar to previous job. Symptoms completely resolve "on the weekends"  Past Medical History  Diagnosis Date  . ANXIETY STATE NEC 05/22/2007  . PSORIASIS 04/24/2007  . INSOMNIA-SLEEP [facility] 12/10/2009   Past Surgical History  Procedure Date  . Cholecystectomy   . Cesarean section     x2 requiring blood transfusion (BTL with 2nd one)  . Tonsilectomy, adenoidectomy, bilateral myringotomy and tubes     reports that she has quit smoking. She does not have any smokeless tobacco history on file. Her alcohol and drug histories not on file. family history includes Alcohol abuse in her mother. No Known Allergies   Review of Systems  patient denies chest pain, shortness of breath, orthopnea. Denies lower extremity edema, abdominal pain, change in appetite, change in bowel movements. Patient denies rashes, musculoskeletal complaints. No other specific complaints in a complete review of systems.      Objective:   Physical Exam  Well-developed well-nourished female in no acute distress. HEENT exam atraumatic, normocephalic, extraocular muscles are intact. Neck is supple. No jugular venous distention no thyromegaly. Chest clear to auscultation without increased work of breathing. Cardiac exam S1 and S2 are regular. Abdominal exam active bowel sounds, soft, nontender.      Assessment & Plan:

## 2011-05-05 NOTE — Assessment & Plan Note (Signed)
Discussed at length. Discussed potential medications. We'll start with alprazolam. Side effects discussed. Minimal use discussed.

## 2011-06-21 ENCOUNTER — Encounter: Payer: Self-pay | Admitting: Internal Medicine

## 2011-06-21 ENCOUNTER — Ambulatory Visit (INDEPENDENT_AMBULATORY_CARE_PROVIDER_SITE_OTHER): Payer: Managed Care, Other (non HMO) | Admitting: Internal Medicine

## 2011-06-21 ENCOUNTER — Telehealth: Payer: Self-pay | Admitting: Internal Medicine

## 2011-06-21 VITALS — BP 100/72 | HR 66 | Temp 97.9°F | Resp 18 | Ht 65.5 in | Wt 158.0 lb

## 2011-06-21 DIAGNOSIS — F329 Major depressive disorder, single episode, unspecified: Secondary | ICD-10-CM

## 2011-06-21 DIAGNOSIS — F32A Depression, unspecified: Secondary | ICD-10-CM

## 2011-06-21 DIAGNOSIS — F341 Dysthymic disorder: Secondary | ICD-10-CM

## 2011-06-21 DIAGNOSIS — F418 Other specified anxiety disorders: Secondary | ICD-10-CM

## 2011-06-21 MED ORDER — BUTALBITAL-APAP-CAFFEINE 50-325-40 MG PO TABS: 1.0000 | ORAL_TABLET | ORAL | Status: DC | PRN

## 2011-06-21 NOTE — Telephone Encounter (Signed)
Rx called to [phone].

## 2011-06-21 NOTE — Assessment & Plan Note (Signed)
Not current SI. Denies would ever act on thoughts and agrees to present immediately to ED if develops suicidal thoughts. Discussed and recommended psychiatry referral and pt is agreeable. Psychiatry graciously agreed to see pt today as workin. Due to concerns over possible headache and paradoxical effect on anxiety will decrease celexa to 20mg  po qd. Schedule close followup in one week or sooner if needed.

## 2011-06-21 NOTE — Telephone Encounter (Signed)
Patient Ins is out of network did not want   appt for today   -appointment  cancelled.   She will see Dr Tomasa Rand @ [facility]   ,Oct 11th

## 2011-06-21 NOTE — Telephone Encounter (Signed)
Rx called to [phone].  

## 2011-06-21 NOTE — Progress Notes (Signed)
  Subjective:    Patient ID: Sara West, female    DOB: May 28, 1966, 45 y.o.   MRN: [id number]  HPI Pt presents to clinic for evaluation of anxiety and depression. Notes worsening sx's of depressed mood and anxious feeling. Has multiple external stressors currently and experiences spontaneous crying spells. Has noted past recent suicidal thoughts but not currently and denies would ever act on them due to her children. Recently her celexa dose was increased from 20mg  to 40mg  and since that time has noted headaches worse in the am and also feels her anxiety sx's are worse since the change. Has attempted xanax and ativan (non concurrently) without improvement. No other alleviating or exacerbating factors. No other complaints.  Past Medical History  Diagnosis Date  . ANXIETY STATE NEC 05/22/2007  . PSORIASIS 04/24/2007  . INSOMNIA-SLEEP [facility] 12/10/2009   Past Surgical History  Procedure Date  . Cholecystectomy   . Cesarean section     x2 requiring blood transfusion (BTL with 2nd one)  . Tonsilectomy, adenoidectomy, bilateral myringotomy and tubes     reports that she has quit smoking. She has never used smokeless tobacco. She reports that she drinks alcohol. She reports that she does not use illicit drugs. family history includes Alcohol abuse in her mother. No Known Allergies     Review of Systems  Psychiatric/Behavioral: Positive for dysphoric mood. Negative for suicidal ideas, behavioral problems, confusion, self-injury, decreased concentration and agitation. The patient is nervous/anxious.        Objective:   Physical Exam  Nursing note and vitals reviewed. Constitutional: She appears well-developed and well-nourished. No distress.  HENT:  Head: Normocephalic and atraumatic.  Right Ear: External ear normal.  Left Ear: External ear normal.  Eyes: Conjunctivae are normal. No scleral icterus.  Neurological: She is alert.  Skin: She is not diaphoretic.  Psychiatric: Her  speech is normal and behavior is normal. Judgment and thought content normal. Her mood appears anxious. Her affect is not angry, not blunt, not labile and not inappropriate. Cognition and memory are normal. She exhibits a depressed mood.          Assessment & Plan:

## 2011-06-21 NOTE — Telephone Encounter (Signed)
Noted  

## 2011-06-27 ENCOUNTER — Encounter: Payer: Self-pay | Admitting: Internal Medicine

## 2011-06-27 ENCOUNTER — Ambulatory Visit (INDEPENDENT_AMBULATORY_CARE_PROVIDER_SITE_OTHER): Payer: Managed Care, Other (non HMO) | Admitting: Internal Medicine

## 2011-06-27 ENCOUNTER — Telehealth: Payer: Self-pay | Admitting: Internal Medicine

## 2011-06-27 VITALS — BP 92/60 | HR 60 | Temp 97.8°F | Resp 18 | Ht 65.5 in | Wt 160.0 lb

## 2011-06-27 DIAGNOSIS — Z0000 Encounter for general adult medical examination without abnormal findings: Secondary | ICD-10-CM

## 2011-06-27 DIAGNOSIS — F418 Other specified anxiety disorders: Secondary | ICD-10-CM

## 2011-06-27 DIAGNOSIS — F341 Dysthymic disorder: Secondary | ICD-10-CM

## 2011-06-27 NOTE — Patient Instructions (Signed)
Please schedule cbc, chem7, lft, lipid, tsh, ua (v70.0) prior to your physical

## 2011-06-27 NOTE — Telephone Encounter (Signed)
Please order labs for one week prior to 08/03/11 cpe visit. Patient will be going downstairs to [facility].  Cbc, chem7, lft, lipid, tsh,ua(v70.0)

## 2011-06-27 NOTE — Telephone Encounter (Signed)
Physical labs entered for [facility] for November 2012.

## 2011-06-28 NOTE — Progress Notes (Signed)
  Subjective:    Patient ID: Sara West, female    DOB: 08/05/1966, 45 y.o.   MRN: [id number]  HPI Pt presents to clinic for follow up of depression and anxiety. Recently seen with worsening depression as well as possible paradoxical worsening of anxiety with increase of celexa. celexa dose reduced at that time. Presents today feeling better with improved mood and anxiety. Headaches have nearly resolved as well. Has psychiatry appt pending this week. No other complaints.  Past Medical History  Diagnosis Date  . ANXIETY STATE NEC 05/22/2007  . PSORIASIS 04/24/2007  . INSOMNIA-SLEEP [facility] 12/10/2009   Past Surgical History  Procedure Date  . Cholecystectomy   . Cesarean section     x2 requiring blood transfusion (BTL with 2nd one)  . Tonsilectomy, adenoidectomy, bilateral myringotomy and tubes     reports that she has quit smoking. She has never used smokeless tobacco. She reports that she drinks alcohol. She reports that she does not use illicit drugs. family history includes Alcohol abuse in her mother. No Known Allergies     Review of Systems  Psychiatric/Behavioral: Positive for dysphoric mood. Negative for suicidal ideas, behavioral problems, confusion, self-injury, decreased concentration and agitation. The patient is not nervous/anxious.        Objective:   Physical Exam  Nursing note and vitals reviewed. Constitutional: She appears well-developed and well-nourished.  HENT:  Head: Normocephalic and atraumatic.  Eyes: Conjunctivae are normal. No scleral icterus.  Neurological: She is alert.  Psychiatric: She has a normal mood and affect. Her behavior is normal. Thought content normal.          Assessment & Plan:

## 2011-06-28 NOTE — Assessment & Plan Note (Signed)
Improved s/p celexa dose decrease. Keep psychiatry appt. Schedule cpe in ~4-6 wks.

## 2011-07-25 ENCOUNTER — Encounter: Payer: Self-pay | Admitting: Internal Medicine

## 2011-07-26 ENCOUNTER — Other Ambulatory Visit: Payer: Self-pay | Admitting: Internal Medicine

## 2011-07-26 DIAGNOSIS — M79673 Pain in unspecified foot: Secondary | ICD-10-CM

## 2011-08-03 ENCOUNTER — Encounter: Payer: Self-pay | Admitting: Internal Medicine

## 2011-08-03 ENCOUNTER — Ambulatory Visit (INDEPENDENT_AMBULATORY_CARE_PROVIDER_SITE_OTHER): Payer: Managed Care, Other (non HMO) | Admitting: Internal Medicine

## 2011-08-03 VITALS — BP 90/68 | HR 67 | Temp 98.1°F | Resp 18 | Ht 65.5 in | Wt 157.0 lb

## 2011-08-03 DIAGNOSIS — Z23 Encounter for immunization: Secondary | ICD-10-CM

## 2011-08-03 DIAGNOSIS — Z0000 Encounter for general adult medical examination without abnormal findings: Secondary | ICD-10-CM | POA: Insufficient documentation

## 2011-08-03 LAB — CBC
HCT: 38.5 % (ref 36.0–46.0)
Hemoglobin: 12.8 g/dL (ref 12.0–15.0)
MCH: 30.4 pg (ref 26.0–34.0)
MCHC: 33.2 g/dL (ref 30.0–36.0)
MCV: 91.4 fL (ref 78.0–100.0)
Platelets: 239 10*3/uL (ref 150–400)
RBC: 4.21 MIL/uL (ref 3.87–5.11)
RDW: 12.3 % (ref 11.5–15.5)
WBC: 7 10*3/uL (ref 4.0–10.5)

## 2011-08-03 LAB — LIPID PANEL
Cholesterol: 121 mg/dL (ref 0–200)
HDL: 44 mg/dL (ref >39)
LDL Cholesterol: 57 mg/dL (ref 0–99)
Total CHOL/HDL Ratio: 2.8 Ratio
Triglycerides: 102 mg/dL (ref <150)
VLDL: 20 mg/dL (ref 0–40)

## 2011-08-03 LAB — HEPATIC FUNCTION PANEL
ALT: 19 U/L (ref 0–35)
AST: 19 U/L (ref 0–37)
Albumin: 4.5 g/dL (ref 3.5–5.2)
Alkaline Phosphatase: 46 U/L (ref 39–117)
Bilirubin, Direct: 0.1 mg/dL (ref 0.0–0.3)
Indirect Bilirubin: 0.2 mg/dL (ref 0.0–0.9)
Total Bilirubin: 0.3 mg/dL (ref 0.3–1.2)
Total Protein: 7 g/dL (ref 6.0–8.3)

## 2011-08-03 LAB — BASIC METABOLIC PANEL
BUN: 15 mg/dL (ref 6–23)
CO2: 27 mEq/L (ref 19–32)
Calcium: 9.6 mg/dL (ref 8.4–10.5)
Chloride: 109 mEq/L (ref 96–112)
Creat: 0.74 mg/dL (ref 0.50–1.10)
Glucose, Bld: 86 mg/dL (ref 70–99)
Potassium: 5.4 mEq/L — ABNORMAL HIGH (ref 3.5–5.3)
Sodium: 141 mEq/L (ref 135–145)

## 2011-08-03 LAB — TSH: TSH: 2.121 u[IU]/mL (ref 0.350–4.500)

## 2011-08-03 NOTE — Patient Instructions (Signed)
Follow up in 6 months 

## 2011-08-03 NOTE — Progress Notes (Signed)
  Subjective:    Patient ID: Sara West, female    DOB: [birth year], 45 y.o.   MRN: [id number]  HPI Pt presents to clinic for annual exam. UTD with pap smear and mammogram. Recent depression/anxiety sx's much improved. Stressor at work now resolved. Self reduced celexa to 10mg  qd and is doing well. Is taking xanax qhs without evidence of addiction or tolerance. No other complaints.  Past Medical History  Diagnosis Date  . ANXIETY STATE NEC 05/22/2007  . PSORIASIS 04/24/2007  . INSOMNIA-SLEEP [facility] 12/10/2009   Past Surgical History  Procedure Date  . Cholecystectomy   . Cesarean section     x2 requiring blood transfusion (BTL with 2nd one)  . Tonsilectomy, adenoidectomy, bilateral myringotomy and tubes     reports that she has quit smoking. She has never used smokeless tobacco. She reports that she drinks alcohol. She reports that she does not use illicit drugs. family history includes Alcohol abuse in her mother. No Known Allergies     Review of Systems see hpi     Objective:   Physical Exam  Nursing note and vitals reviewed. Constitutional: She appears well-developed and well-nourished. No distress.  HENT:  Head: Normocephalic and atraumatic.  Right Ear: Tympanic membrane, external ear and ear canal normal.  Left Ear: Tympanic membrane, external ear and ear canal normal.  Nose: Nose normal.  Mouth/Throat: Oropharynx is clear and moist. No oropharyngeal exudate.  Eyes: Conjunctivae and EOM are normal. Pupils are equal, round, and reactive to light. Right eye exhibits no discharge. Left eye exhibits no discharge. No scleral icterus.  Neck: Neck supple. No thyromegaly present.  Cardiovascular: Normal rate, regular rhythm, normal heart sounds and intact distal pulses.  Exam reveals no gallop and no friction rub.   No murmur heard. Pulmonary/Chest: Effort normal and breath sounds normal. No respiratory distress. She has no wheezes. She has no rales.  Abdominal: Soft.  Bowel sounds are normal. She exhibits no distension and no mass. There is no tenderness. There is no rebound and no guarding.  Lymphadenopathy:    She has no cervical adenopathy.  Neurological: She is alert.  Skin: Skin is warm and dry. She is not diaphoretic.  Psychiatric: She has a normal mood and affect.          Assessment & Plan:

## 2011-08-03 NOTE — Assessment & Plan Note (Signed)
Nl exam. Obtain screening labs. Flu vaccine provided.

## 2011-08-04 LAB — URINALYSIS, ROUTINE W REFLEX MICROSCOPIC
Bilirubin Urine: NEGATIVE
Glucose, UA: NEGATIVE mg/dL
Hgb urine dipstick: NEGATIVE
Ketones, ur: NEGATIVE mg/dL
Leukocytes, UA: NEGATIVE
Nitrite: NEGATIVE
Protein, ur: NEGATIVE mg/dL
Specific Gravity, Urine: 1.021 (ref 1.005–1.030)
Urobilinogen, UA: 0.2 mg/dL (ref 0.0–1.0)
pH: 7 (ref 5.0–8.0)

## 2011-08-15 ENCOUNTER — Encounter: Payer: Self-pay | Admitting: Internal Medicine

## 2011-08-15 MED ORDER — ALPRAZOLAM 0.25 MG PO TABS: 0.2500 mg | ORAL_TABLET | Freq: Every evening | ORAL | Status: DC | PRN

## 2011-08-15 NOTE — Telephone Encounter (Signed)
Rx refill called to [facility] pharmacist.

## 2011-08-17 ENCOUNTER — Other Ambulatory Visit: Payer: Self-pay | Admitting: *Deleted

## 2011-08-17 NOTE — Telephone Encounter (Signed)
Refill alprazolam 0.25 mg 1 qod prn

## 2011-08-18 NOTE — Telephone Encounter (Signed)
Done 08/15/11

## 2011-08-18 NOTE — Telephone Encounter (Signed)
Rx completed on 08/15/11. Spoke to pharmacist at [facility] and verified receipt. They will remove request from the fax line.

## 2011-09-16 ENCOUNTER — Telehealth: Payer: Self-pay | Admitting: Internal Medicine

## 2011-09-16 MED ORDER — ALPRAZOLAM 0.25 MG PO TABS: 0.2500 mg | ORAL_TABLET | Freq: Every evening | ORAL | Status: DC | PRN

## 2011-09-16 NOTE — Telephone Encounter (Signed)
Refill left on voicemail at [facility] for alprazolam 0.25mg .  1 tablet at bedtime as needed for anxiety. # 30 x no refills.

## 2011-11-02 ENCOUNTER — Telehealth: Payer: Self-pay | Admitting: Internal Medicine

## 2011-11-02 MED ORDER — ALPRAZOLAM 0.25 MG PO TABS: 0.2500 mg | ORAL_TABLET | Freq: Every evening | ORAL | Status: DC | PRN

## 2011-11-02 NOTE — Telephone Encounter (Signed)
Refill- alprazolam 0.25mg  tablet. Take one tablet by mouth at bedtime as needed for anxiety.

## 2011-11-02 NOTE — Telephone Encounter (Signed)
Verbal refill provided to pharmacist Robin at [facility].

## 2011-11-02 NOTE — Telephone Encounter (Signed)
#  30 rf 1 

## 2011-11-30 ENCOUNTER — Ambulatory Visit: Payer: Managed Care, Other (non HMO) | Admitting: Physical Therapy

## 2011-11-30 DIAGNOSIS — IMO0001 Reserved for inherently not codable concepts without codable children: Secondary | ICD-10-CM | POA: Insufficient documentation

## 2011-11-30 DIAGNOSIS — M25579 Pain in unspecified ankle and joints of unspecified foot: Secondary | ICD-10-CM | POA: Insufficient documentation

## 2011-11-30 DIAGNOSIS — M25673 Stiffness of unspecified ankle, not elsewhere classified: Secondary | ICD-10-CM | POA: Insufficient documentation

## 2011-11-30 DIAGNOSIS — M25676 Stiffness of unspecified foot, not elsewhere classified: Secondary | ICD-10-CM | POA: Insufficient documentation

## 2011-12-01 ENCOUNTER — Ambulatory Visit: Payer: Managed Care, Other (non HMO) | Admitting: Rehabilitation

## 2011-12-06 ENCOUNTER — Ambulatory Visit: Payer: Managed Care, Other (non HMO) | Admitting: Rehabilitation

## 2011-12-07 ENCOUNTER — Ambulatory Visit: Payer: Managed Care, Other (non HMO) | Admitting: Physical Therapy

## 2011-12-08 ENCOUNTER — Ambulatory Visit: Payer: Managed Care, Other (non HMO) | Admitting: Rehabilitation

## 2011-12-12 ENCOUNTER — Ambulatory Visit: Payer: Managed Care, Other (non HMO) | Admitting: Physical Therapy

## 2011-12-14 ENCOUNTER — Ambulatory Visit: Payer: Managed Care, Other (non HMO) | Admitting: Physical Therapy

## 2011-12-15 ENCOUNTER — Encounter: Payer: Managed Care, Other (non HMO) | Admitting: Rehabilitation

## 2011-12-16 ENCOUNTER — Encounter: Payer: Managed Care, Other (non HMO) | Admitting: Rehabilitation

## 2011-12-19 ENCOUNTER — Ambulatory Visit: Payer: Managed Care, Other (non HMO) | Admitting: Physical Therapy

## 2011-12-19 DIAGNOSIS — M25579 Pain in unspecified ankle and joints of unspecified foot: Secondary | ICD-10-CM | POA: Insufficient documentation

## 2011-12-19 DIAGNOSIS — M25676 Stiffness of unspecified foot, not elsewhere classified: Secondary | ICD-10-CM | POA: Insufficient documentation

## 2011-12-19 DIAGNOSIS — M25673 Stiffness of unspecified ankle, not elsewhere classified: Secondary | ICD-10-CM | POA: Insufficient documentation

## 2011-12-19 DIAGNOSIS — IMO0001 Reserved for inherently not codable concepts without codable children: Secondary | ICD-10-CM | POA: Insufficient documentation

## 2011-12-20 ENCOUNTER — Other Ambulatory Visit (HOSPITAL_COMMUNITY): Payer: Self-pay | Admitting: Obstetrics and Gynecology

## 2011-12-20 DIAGNOSIS — Z1231 Encounter for screening mammogram for malignant neoplasm of breast: Secondary | ICD-10-CM

## 2011-12-21 ENCOUNTER — Ambulatory Visit: Payer: Managed Care, Other (non HMO) | Admitting: Physical Therapy

## 2011-12-23 ENCOUNTER — Encounter: Payer: Managed Care, Other (non HMO) | Admitting: Physical Therapy

## 2011-12-28 ENCOUNTER — Ambulatory Visit: Payer: Managed Care, Other (non HMO) | Admitting: Physical Therapy

## 2012-01-04 ENCOUNTER — Ambulatory Visit: Payer: Managed Care, Other (non HMO) | Admitting: Physical Therapy

## 2012-01-11 ENCOUNTER — Ambulatory Visit: Payer: Managed Care, Other (non HMO) | Admitting: Physical Therapy

## 2012-01-18 ENCOUNTER — Ambulatory Visit: Payer: Managed Care, Other (non HMO) | Admitting: Physical Therapy

## 2012-01-18 DIAGNOSIS — M25673 Stiffness of unspecified ankle, not elsewhere classified: Secondary | ICD-10-CM | POA: Insufficient documentation

## 2012-01-18 DIAGNOSIS — M25579 Pain in unspecified ankle and joints of unspecified foot: Secondary | ICD-10-CM | POA: Insufficient documentation

## 2012-01-18 DIAGNOSIS — IMO0001 Reserved for inherently not codable concepts without codable children: Secondary | ICD-10-CM | POA: Insufficient documentation

## 2012-01-18 DIAGNOSIS — M25676 Stiffness of unspecified foot, not elsewhere classified: Secondary | ICD-10-CM | POA: Insufficient documentation

## 2012-01-25 ENCOUNTER — Encounter: Payer: Managed Care, Other (non HMO) | Admitting: Physical Therapy

## 2012-01-26 ENCOUNTER — Ambulatory Visit (HOSPITAL_COMMUNITY)
Admission: RE | Admit: 2012-01-26 | Discharge: 2012-01-26 | Disposition: A | Discharge status: Home / Self Care | Payer: Managed Care, Other (non HMO) | Source: Ambulatory Visit | Attending: Obstetrics and Gynecology | Admitting: Obstetrics and Gynecology

## 2012-01-26 DIAGNOSIS — Z1231 Encounter for screening mammogram for malignant neoplasm of breast: Secondary | ICD-10-CM

## 2012-01-30 ENCOUNTER — Ambulatory Visit (INDEPENDENT_AMBULATORY_CARE_PROVIDER_SITE_OTHER): Payer: Managed Care, Other (non HMO) | Admitting: Internal Medicine

## 2012-01-30 ENCOUNTER — Telehealth: Payer: Self-pay | Admitting: Internal Medicine

## 2012-01-30 ENCOUNTER — Encounter: Payer: Self-pay | Admitting: Internal Medicine

## 2012-01-30 VITALS — BP 100/72 | HR 63 | Temp 97.8°F | Resp 18 | Ht 65.5 in | Wt 144.0 lb

## 2012-01-30 DIAGNOSIS — F418 Other specified anxiety disorders: Secondary | ICD-10-CM

## 2012-01-30 DIAGNOSIS — F341 Dysthymic disorder: Secondary | ICD-10-CM

## 2012-01-30 MED ORDER — CITALOPRAM HYDROBROMIDE 10 MG PO TABS: 10.0000 mg | ORAL_TABLET | Freq: Every day | ORAL | Status: DC

## 2012-01-30 MED ORDER — CITALOPRAM HYDROBROMIDE 10 MG PO TABS: 20.0000 mg | ORAL_TABLET | Freq: Every day | ORAL | Status: DC

## 2012-01-30 NOTE — Progress Notes (Signed)
  Subjective:    Patient ID: Sara West, female    DOB: 1966-04-03, 46 y.o.   MRN: [id number]  HPI Pt presents to clinic for followup of multiple medical problems. Previously intolerant of higher doses of celexa currently taking 10mg  qd. Did wean off but resumed feels lethargic and has intermittent ha's. Notes was recently working 16 hour days during [employer]. Has vacation scheduled later this month. Otherwise no other complaints.  Past Medical History  Diagnosis Date  . ANXIETY STATE NEC 05/22/2007  . PSORIASIS 04/24/2007  . INSOMNIA-SLEEP [facility] 12/10/2009   Past Surgical History  Procedure Date  . Cholecystectomy   . Cesarean section     x2 requiring blood transfusion (BTL with 2nd one)  . Tonsilectomy, adenoidectomy, bilateral myringotomy and tubes     reports that she has quit smoking. She has never used smokeless tobacco. She reports that she drinks alcohol. She reports that she does not use illicit drugs. family history includes Alcohol abuse in her mother. No Known Allergies    Review of Systems see hpi    Objective:   Physical Exam  Physical Exam  Nursing note and vitals reviewed. Constitutional: Appears well-developed and well-nourished. No distress.  HENT:  Head: Normocephalic and atraumatic.  Right Ear: External ear normal.  Left Ear: External ear normal.  Eyes: Conjunctivae are normal. No scleral icterus.  Neck: Neck supple. Carotid bruit is not present.  Cardiovascular: Normal rate, regular rhythm and normal heart sounds.  Exam reveals no gallop and no friction rub.   No murmur heard. Pulmonary/Chest: Effort normal and breath sounds normal. No respiratory distress. He has no wheezes. no rales.  Lymphadenopathy:    He has no cervical adenopathy.  Neurological:Alert.  Skin: Skin is warm and dry. Not diaphoretic.  Psychiatric: Has a normal mood and affect.        Assessment & Plan:

## 2012-01-30 NOTE — Telephone Encounter (Signed)
Rx sent to pharmacy   

## 2012-01-30 NOTE — Patient Instructions (Signed)
Please schedule cpe fasting labs prior to your next appointment

## 2012-01-30 NOTE — Telephone Encounter (Signed)
Pharmacy comments:  Patient requests 90 day supply on citalopram prescription

## 2012-01-30 NOTE — Assessment & Plan Note (Signed)
rf celexa 10mg  tab so doesn't have to use pill cutter. If does not improve with rest/vacation consider change to very low dose paxil or other medication.

## 2012-02-01 ENCOUNTER — Encounter: Payer: Managed Care, Other (non HMO) | Admitting: Physical Therapy

## 2012-08-03 ENCOUNTER — Encounter: Payer: Managed Care, Other (non HMO) | Admitting: Internal Medicine

## 2012-08-10 ENCOUNTER — Encounter: Payer: Self-pay | Admitting: Internal Medicine

## 2012-08-10 ENCOUNTER — Ambulatory Visit (INDEPENDENT_AMBULATORY_CARE_PROVIDER_SITE_OTHER): Payer: Managed Care, Other (non HMO) | Admitting: Internal Medicine

## 2012-08-10 VITALS — BP 112/70 | HR 64 | Temp 98.4°F | Resp 14 | Ht 64.75 in | Wt 137.5 lb

## 2012-08-10 DIAGNOSIS — Z0000 Encounter for general adult medical examination without abnormal findings: Secondary | ICD-10-CM

## 2012-08-10 DIAGNOSIS — Z23 Encounter for immunization: Secondary | ICD-10-CM

## 2012-08-10 NOTE — Patient Instructions (Signed)
Please schedule fasting labs for next Tues  Cbc, chem7, lipid, lft, tsh, ua with reflex micro-v70.0

## 2012-08-10 NOTE — Progress Notes (Signed)
  Subjective:    Patient ID: Sara West, female    DOB: [birth year], 46 y.o.   MRN: [id number]  HPI patient presents to clinic for annual physical exam. Has successfully lost weight and feels much better overall. Mood and anxiety are under good control. Up-to-date with GYN care as well as mammogram. No active complaints.  Past Medical History  Diagnosis Date  . ANXIETY STATE NEC 05/22/2007  . PSORIASIS 04/24/2007  . INSOMNIA-SLEEP [facility] 12/10/2009   Past Surgical History  Procedure Date  . Cholecystectomy   . Cesarean section     x2 requiring blood transfusion (BTL with 2nd one)  . Tonsilectomy, adenoidectomy, bilateral myringotomy and tubes     reports that she has quit smoking. She has never used smokeless tobacco. She reports that she drinks alcohol. She reports that she does not use illicit drugs. family history includes Alcohol abuse in her mother. No Known Allergies   Review of Systems see hpi     Objective:   Physical Exam  Nursing note and vitals reviewed. Constitutional: She appears well-developed and well-nourished. No distress.  HENT:  Head: Normocephalic and atraumatic.  Right Ear: External ear normal.  Left Ear: External ear normal.  Nose: Nose normal.  Mouth/Throat: Oropharynx is clear and moist. No oropharyngeal exudate.  Eyes: Conjunctivae normal and EOM are normal. Pupils are equal, round, and reactive to light. No scleral icterus.  Neck: Neck supple. Carotid bruit is not present. No thyromegaly present.  Cardiovascular: Normal rate, regular rhythm and normal heart sounds.  Exam reveals no gallop and no friction rub.   No murmur heard. Pulmonary/Chest: Effort normal and breath sounds normal. No respiratory distress. She has no wheezes. She has no rales.  Abdominal: Soft. Normal appearance and bowel sounds are normal. She exhibits no distension and no mass. There is no hepatosplenomegaly. There is no tenderness. There is no rebound and no guarding.    Lymphadenopathy:    She has no cervical adenopathy.  Neurological: She is alert.  Skin: Skin is warm and dry. She is not diaphoretic.  Psychiatric: She has a normal mood and affect.          Assessment & Plan:

## 2012-08-10 NOTE — Assessment & Plan Note (Signed)
Normal exam. Obtain CPE labs. Continue GYN care. Administer influenza vaccine

## 2012-08-15 ENCOUNTER — Other Ambulatory Visit: Payer: Self-pay | Admitting: Internal Medicine

## 2012-08-15 NOTE — Telephone Encounter (Signed)
Rx phoned to pharmacy/SLS 

## 2012-08-15 NOTE — Telephone Encounter (Signed)
Ok #30 rf 1 

## 2013-01-23 ENCOUNTER — Other Ambulatory Visit (HOSPITAL_COMMUNITY): Payer: Self-pay | Admitting: Obstetrics

## 2013-01-23 DIAGNOSIS — Z1231 Encounter for screening mammogram for malignant neoplasm of breast: Secondary | ICD-10-CM

## 2013-02-13 ENCOUNTER — Ambulatory Visit (HOSPITAL_COMMUNITY)
Admission: RE | Admit: 2013-02-13 | Discharge: 2013-02-13 | Disposition: A | Discharge status: Home / Self Care | Payer: Managed Care, Other (non HMO) | Source: Ambulatory Visit | Attending: Obstetrics | Admitting: Obstetrics

## 2013-02-13 DIAGNOSIS — Z1231 Encounter for screening mammogram for malignant neoplasm of breast: Secondary | ICD-10-CM

## 2013-03-05 ENCOUNTER — Encounter: Payer: Self-pay | Admitting: Family Medicine

## 2013-03-05 ENCOUNTER — Ambulatory Visit (INDEPENDENT_AMBULATORY_CARE_PROVIDER_SITE_OTHER): Payer: Managed Care, Other (non HMO) | Admitting: Family Medicine

## 2013-03-05 ENCOUNTER — Ambulatory Visit (HOSPITAL_BASED_OUTPATIENT_CLINIC_OR_DEPARTMENT_OTHER)
Admission: RE | Admit: 2013-03-05 | Discharge: 2013-03-05 | Disposition: A | Discharge status: Home / Self Care | Payer: Managed Care, Other (non HMO) | Source: Ambulatory Visit | Attending: Family Medicine | Admitting: Family Medicine

## 2013-03-05 VITALS — BP 102/82 | HR 62 | Temp 98.2°F | Ht 65.5 in | Wt 139.1 lb

## 2013-03-05 DIAGNOSIS — M722 Plantar fascial fibromatosis: Secondary | ICD-10-CM

## 2013-03-05 DIAGNOSIS — M79609 Pain in unspecified limb: Secondary | ICD-10-CM

## 2013-03-05 DIAGNOSIS — Z0000 Encounter for general adult medical examination without abnormal findings: Secondary | ICD-10-CM

## 2013-03-05 DIAGNOSIS — M545 Low back pain, unspecified: Secondary | ICD-10-CM

## 2013-03-05 DIAGNOSIS — M47817 Spondylosis without myelopathy or radiculopathy, lumbosacral region: Secondary | ICD-10-CM | POA: Insufficient documentation

## 2013-03-05 DIAGNOSIS — M79604 Pain in right leg: Secondary | ICD-10-CM

## 2013-03-05 DIAGNOSIS — M4120 Other idiopathic scoliosis, site unspecified: Secondary | ICD-10-CM | POA: Insufficient documentation

## 2013-03-05 DIAGNOSIS — M79605 Pain in left leg: Secondary | ICD-10-CM | POA: Insufficient documentation

## 2013-03-05 HISTORY — DX: Plantar fascial fibromatosis: M72.2

## 2013-03-05 HISTORY — DX: Pain in left leg: M79.605

## 2013-03-05 HISTORY — DX: Pain in right leg: M79.604

## 2013-03-05 LAB — HEPATIC FUNCTION PANEL
ALT: 17 U/L (ref 0–35)
AST: 18 U/L (ref 0–37)
Albumin: 4.2 g/dL (ref 3.5–5.2)
Alkaline Phosphatase: 34 U/L — ABNORMAL LOW (ref 39–117)
Bilirubin, Direct: 0.1 mg/dL (ref 0.0–0.3)
Indirect Bilirubin: 0.2 mg/dL (ref 0.0–0.9)
Total Bilirubin: 0.3 mg/dL (ref 0.3–1.2)
Total Protein: 7 g/dL (ref 6.0–8.3)

## 2013-03-05 LAB — RENAL FUNCTION PANEL
Albumin: 4.2 g/dL (ref 3.5–5.2)
BUN: 12 mg/dL (ref 6–23)
CO2: 27 mEq/L (ref 19–32)
Calcium: 9.4 mg/dL (ref 8.4–10.5)
Chloride: 104 mEq/L (ref 96–112)
Creat: 0.68 mg/dL (ref 0.50–1.10)
Glucose, Bld: 91 mg/dL (ref 70–99)
Phosphorus: 3.5 mg/dL (ref 2.3–4.6)
Potassium: 4.9 mEq/L (ref 3.5–5.3)
Sodium: 139 mEq/L (ref 135–145)

## 2013-03-05 LAB — CBC
HCT: 36.6 % (ref 36.0–46.0)
Hemoglobin: 12.2 g/dL (ref 12.0–15.0)
MCH: 30 pg (ref 26.0–34.0)
MCHC: 33.3 g/dL (ref 30.0–36.0)
MCV: 90.1 fL (ref 78.0–100.0)
Platelets: 256 10*3/uL (ref 150–400)
RBC: 4.06 MIL/uL (ref 3.87–5.11)
RDW: 13.6 % (ref 11.5–15.5)
WBC: 6 10*3/uL (ref 4.0–10.5)

## 2013-03-05 LAB — LIPID PANEL
Cholesterol: 131 mg/dL (ref 0–200)
HDL: 55 mg/dL (ref >39)
LDL Cholesterol: 64 mg/dL (ref 0–99)
Total CHOL/HDL Ratio: 2.4 Ratio
Triglycerides: 61 mg/dL (ref <150)
VLDL: 12 mg/dL (ref 0–40)

## 2013-03-05 LAB — SEDIMENTATION RATE: Sed Rate: 4 mm/hr (ref 0–22)

## 2013-03-05 IMAGING — CR DG LUMBAR SPINE COMPLETE 4+V
5 series · 5 of 5 positions shown · non-contrast
Comparison: None.

CLINICAL DATA: Low back pain with radicular symptoms

LUMBAR SPINE - COMPLETE 4+ VIEW

[t l-spine a.p.]
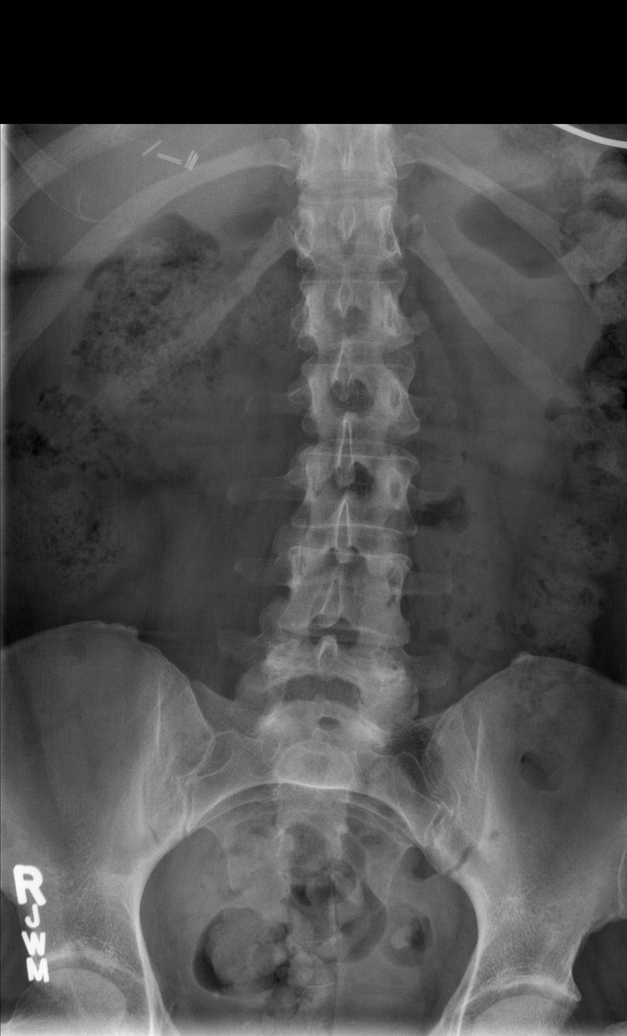

[t l-spine oblique exposure (1 of 2)]
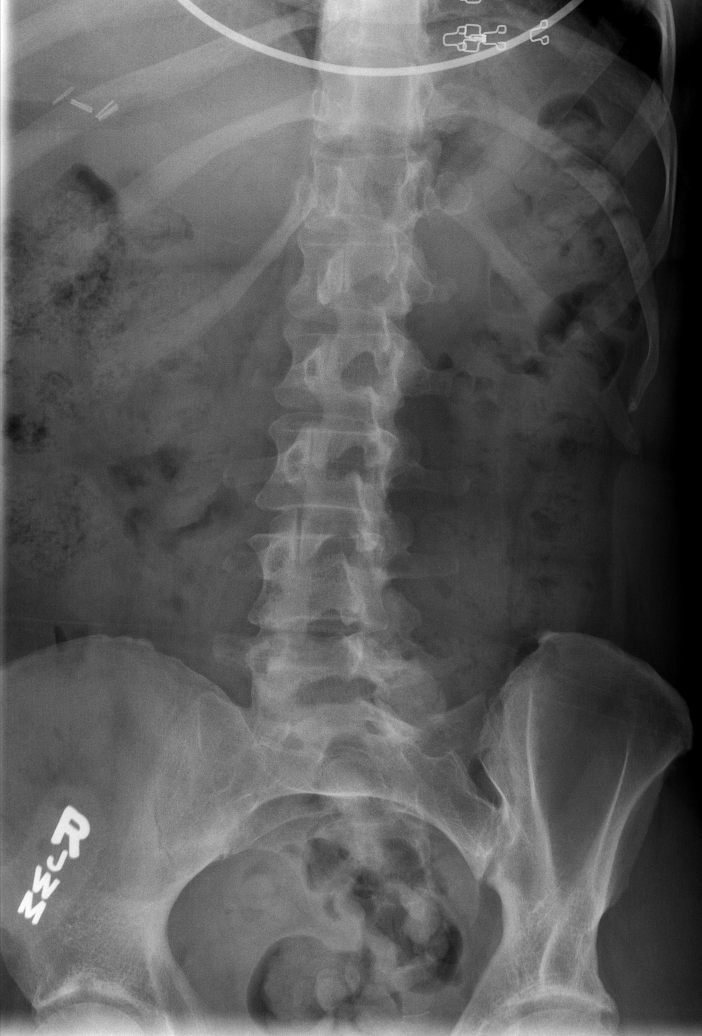

[t l-spine oblique exposure (2 of 2)]
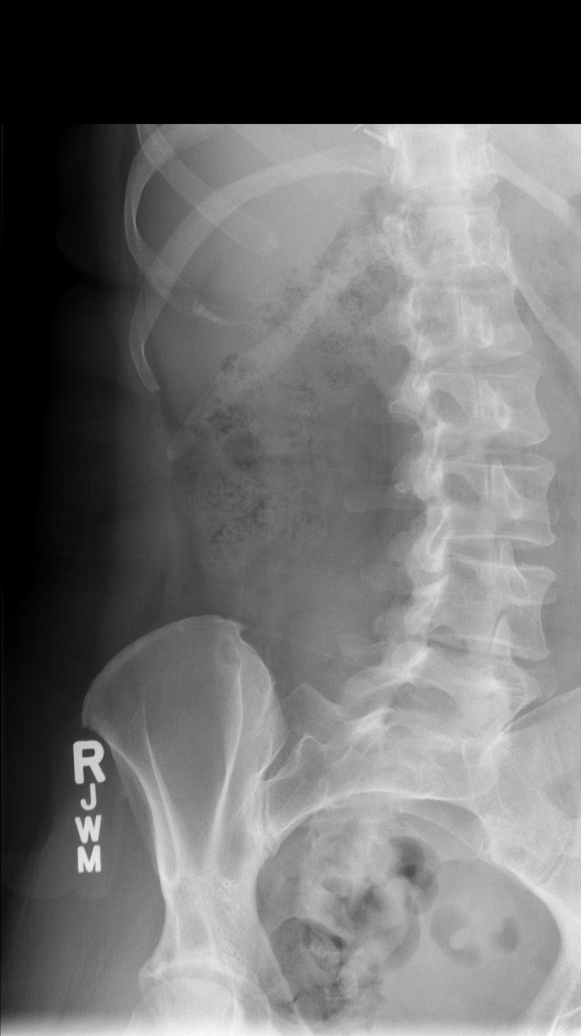

[t l-spine lat]
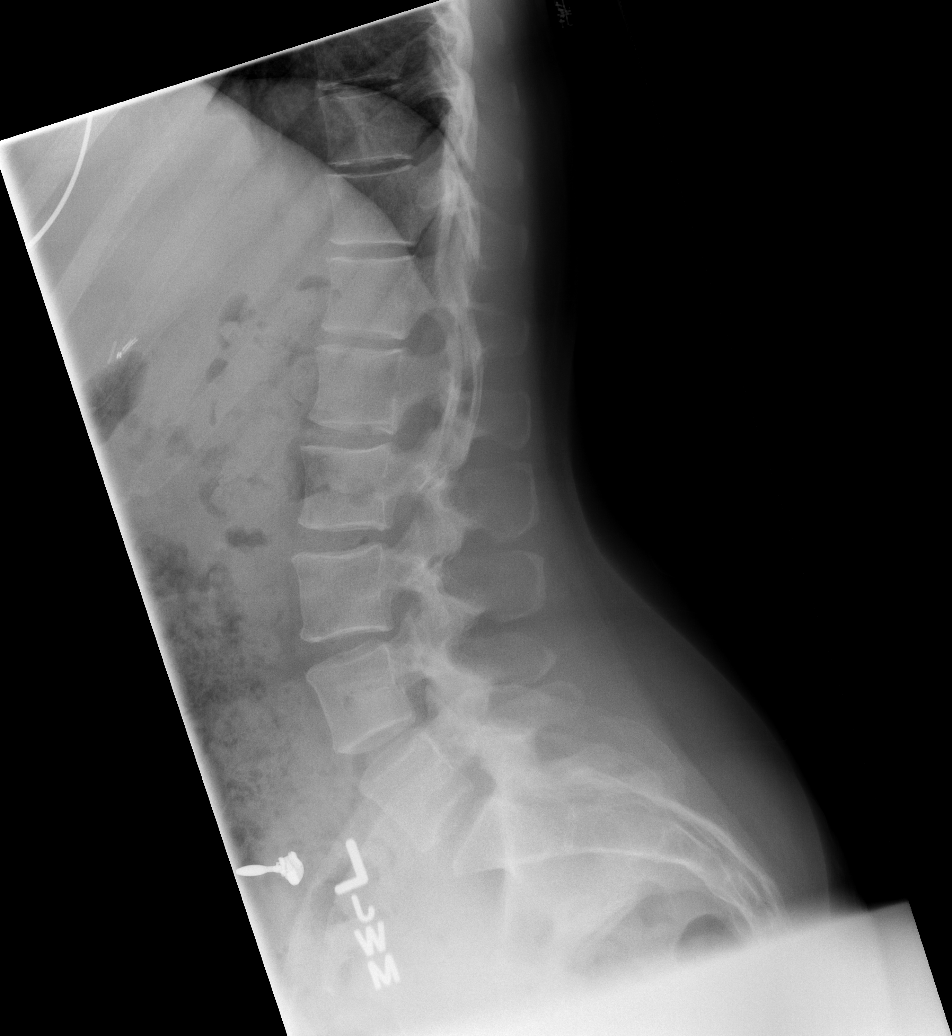

[t l-spine l5-s1 spot]
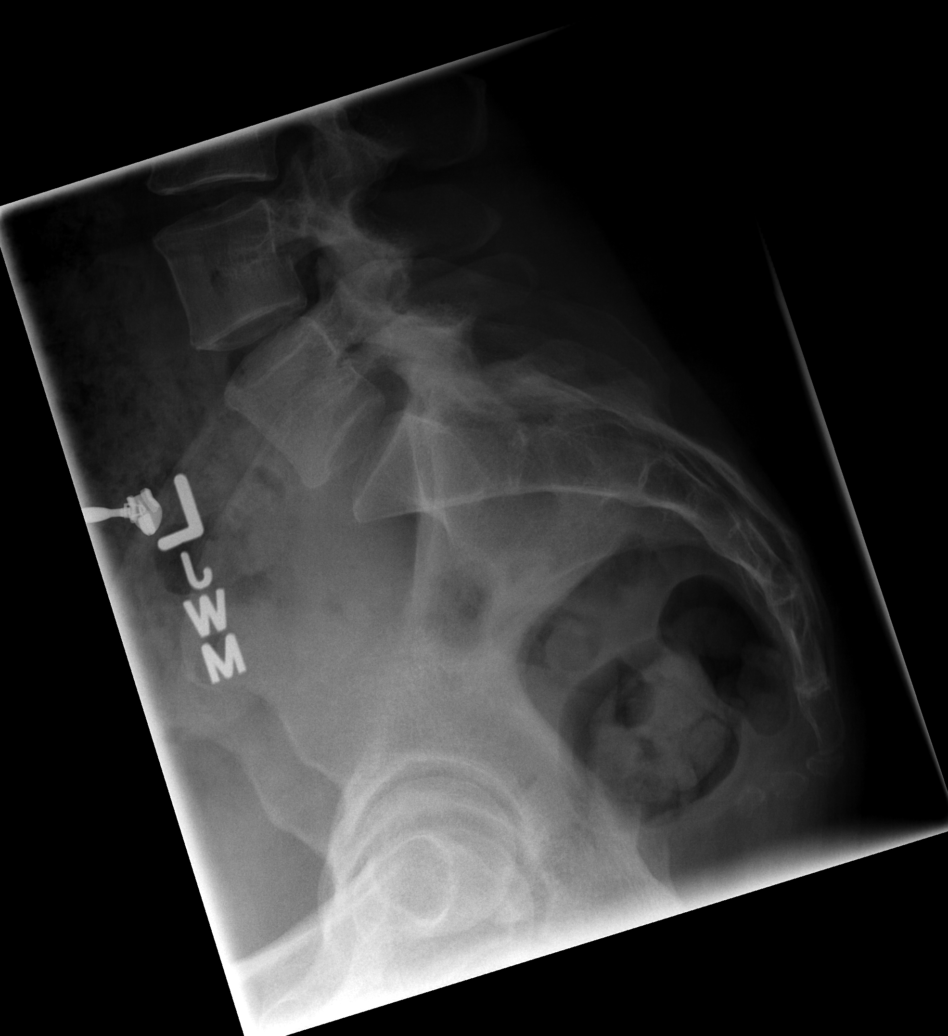

[5 of 5 positions shown; findings below may reference images not displayed]

FINDINGS: Frontal, lateral, spot lumbosacral lateral, bilateral
oblique views were obtained.  There are six non-rib bearing lumbar
type vertebral bodies.  There is no fracture or spondylolisthesis.
There is levo rotoscoliosis.  There is moderate disc space
narrowing at L4-5 and L5-L6. There is facet osteoarthritic change
at L5-L6 and L6-S1 bilaterally.
IMPRESSION: Scoliosis and osteoarthritic change.  No fracture or
spondylolisthesis.

## 2013-03-05 MED ORDER — TRAMADOL HCL 50 MG PO TABS: 50.0000 mg | ORAL_TABLET | Freq: Three times a day (TID) | ORAL | Status: DC | PRN

## 2013-03-05 MED ORDER — HYDROCODONE-ACETAMINOPHEN 5-325 MG PO TABS: 1.0000 | ORAL_TABLET | Freq: Four times a day (QID) | ORAL | Status: DC | PRN

## 2013-03-05 NOTE — Assessment & Plan Note (Addendum)
Starts at b/l hips now and hurts to feet, legs are shaking due to pain. Check an xray of low back, check labs prior to visit. RF, ANA, CBC, ESR. Labs all normal and xray showssome arthritic changes and scoliosis. Try Wallene Huh and therapeutic Pilates and if no improvement may benefit from referral to ortho or neurosurgery

## 2013-03-05 NOTE — Patient Instructions (Addendum)
Jobst compression hose, light weight knee highs.  Salon pas patches or cream Next visit annual  Plantar Fasciitis Plantar fasciitis is a common condition that causes foot pain. It is soreness (inflammation) of the band of tough fibrous tissue on the bottom of the foot that runs from the heel bone (calcaneus) to the ball of the foot. The cause of this soreness may be from excessive standing, poor fitting shoes, running on hard surfaces, being overweight, having an abnormal walk, or overuse (this is common in runners) of the painful foot or feet. It is also common in aerobic exercise dancers and ballet dancers. SYMPTOMS  Most people with plantar fasciitis complain of:  Severe pain in the morning on the bottom of their foot especially when taking the first steps out of bed. This pain recedes after a few minutes of walking.  Severe pain is experienced also during walking following a long period of inactivity.  Pain is worse when walking barefoot or up stairs DIAGNOSIS   Your caregiver will diagnose this condition by examining and feeling your foot.  Special tests such as X-rays of your foot, are usually not needed. PREVENTION   Consult a sports medicine professional before beginning a new exercise program.  Walking programs offer a good workout. With walking there is a lower chance of overuse injuries common to runners. There is less impact and less jarring of the joints.  Begin all new exercise programs slowly. If problems or pain develop, decrease the amount of time or distance until you are at a comfortable level.  Wear good shoes and replace them regularly.  Stretch your foot and the heel cords at the back of the ankle (Achilles tendon) both before and after exercise.  Run or exercise on even surfaces that are not hard. For example, asphalt is better than pavement.  Do not run barefoot on hard surfaces.  If using a treadmill, vary the incline.  Do not continue to workout if you  have foot or joint problems. Seek professional help if they do not improve. HOME CARE INSTRUCTIONS   Avoid activities that cause you pain until you recover.  Use ice or cold packs on the problem or painful areas after working out.  Only take over-the-counter or prescription medicines for pain, discomfort, or fever as directed by your caregiver.  Soft shoe inserts or athletic shoes with air or gel sole cushions may be helpful.  If problems continue or become more severe, consult a sports medicine caregiver or your own health care provider. Cortisone is a potent anti-inflammatory medication that may be injected into the painful area. You can discuss this treatment with your caregiver. MAKE SURE YOU:   Understand these instructions.  Will watch your condition.  Will get help right away if you are not doing well or get worse. Document Released: 05/31/2001 Document Revised: 11/28/2011 Document Reviewed: 07/30/2008 [facility] Patient Information 2014 [place], [place].

## 2013-03-05 NOTE — Assessment & Plan Note (Signed)
Pain is worsening now and is unable to continue standing for more than 4 hours. Pain is no longer confined to feet but starts at hips b/l.

## 2013-03-05 NOTE — Progress Notes (Signed)
Patient ID: Sara West, female   DOB: [birth year], 47 y.o.   MRN: [id number] KORY PANJWANI [id number] [birth year] 03/05/2013      Progress Note-Follow Up  Subjective  Chief Complaint  Chief Complaint  Patient presents with  . Leg Pain    pain in both legs when standing for more than 3-4 hours    HPI  Patient is a 47 year old Caucasian female who is in today complaining of bilateral leg pain. She's been struggling with plantar fasciitis in both feet for quite some time and notes that she has made significant changes with changes in foot wear, orthotics, stretching and neck pain is persistent. At this point she's having pain which is significant enough to make her get off her feet whenever she's been on her feet for more than 2 hours. Pain now is from the hips all the way down to the feet and occasionally she even notes some low back pain. She denies any trauma or injury. She denies any incontinence, bowel or bladder concerns. No fevers or chills. No chest pain, palpitations, shortness of breath  Past Medical History  Diagnosis Date  . ANXIETY STATE NEC 05/22/2007  . PSORIASIS 04/24/2007  . INSOMNIA-SLEEP DISORDER-[facility] 12/10/2009  . Plantar fasciitis, bilateral 03/05/2013  . Leg pain, bilateral 03/05/2013    Past Surgical History  Procedure Laterality Date  . Cholecystectomy    . Cesarean section      x2 requiring blood transfusion (BTL with 2nd one)  . Tonsilectomy, adenoidectomy, bilateral myringotomy and tubes      Family History  Problem Relation Age of Onset  . Alcohol abuse Mother     History   Social History  . Marital Status: Married    Spouse Name: N/A    Number of Children: N/A  . Years of Education: N/A   Occupational History  . 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 Smoking status: Former Games developer  . Smokeless tobacco: Never Used  . Alcohol Use: Yes  . Drug Use: No  . Sexually Active: Not on file   Other Topics Concern  . Not  on file   Social History Narrative  . No narrative on file    Current Outpatient Prescriptions on File Prior to Visit  Medication Sig Dispense Refill  . ALPRAZolam (XANAX) 0.25 MG tablet TAKE 1 TABLET BY MOUTH AT BEDTIME AS NEEDED FOR ANXIETY  30 tablet  1  . butalbital-acetaminophen-caffeine (FIORICET) 50-325-40 MG per tablet Take 1 tablet by mouth every 4 (four) hours as needed for headache. maximum 6 tablets in a 24 hours period  30 tablet  0  . citalopram (CELEXA) 10 MG tablet Take 1 tablet (10 mg total) by mouth daily.  90 tablet  4  . Multiple Vitamins-Minerals (WOMENS MULTIVITAMIN PLUS) TABS Take 1 each by mouth daily. ALIVE NATURAL MVI.       No current facility-administered medications on file prior to visit.    No Known Allergies  Review of Systems  Review of Systems  Constitutional: Negative for fever and malaise/fatigue.  HENT: Negative for congestion.   Eyes: Negative for discharge.  Respiratory: Negative for shortness of breath.   Cardiovascular: Negative for chest pain, palpitations and leg swelling.  Gastrointestinal: Negative for nausea, abdominal pain and diarrhea.  Genitourinary: Negative for dysuria.  Musculoskeletal: Positive for myalgias, back pain and joint pain. Negative for falls.  Skin: Negative for rash.  Neurological: Negative for loss of consciousness and  headaches.  Endo/Heme/Allergies: Negative for polydipsia.  Psychiatric/Behavioral: Negative for depression and suicidal ideas. The patient is not nervous/anxious and does not have insomnia.     Objective  BP 102/82  Pulse 62  Temp(Src) 98.2 F (36.8 C) (Oral)  Ht 5' 5.5" (1.664 m)  Wt 139 lb 1.9 oz (63.104 kg)  BMI 22.79 kg/m2  SpO2 98%  LMP 02/20/2013  Physical Exam  Physical Exam  Constitutional: She is oriented to person, place, and time and well-developed, well-nourished, and in no distress. No distress.  HENT:  Head: Normocephalic and atraumatic.  Eyes: Conjunctivae are normal.   Neck: Neck supple. No thyromegaly present.  Cardiovascular: Normal rate, regular rhythm and normal heart sounds.   No murmur heard. Pulmonary/Chest: Effort normal and breath sounds normal. She has no wheezes.  Abdominal: She exhibits no distension and no mass.  Musculoskeletal: She exhibits no edema.  Lymphadenopathy:    She has no cervical adenopathy.  Neurological: She is alert and oriented to person, place, and time.  Skin: Skin is warm and dry. No rash noted. She is not diaphoretic.  Psychiatric: Memory, affect and judgment normal.    Lab Results  Component Value Date   TSH 2.121 08/03/2011   Lab Results  Component Value Date   WBC 7.0 08/03/2011   HGB 12.8 08/03/2011   HCT 38.5 08/03/2011   MCV 91.4 08/03/2011   PLT 239 08/03/2011   Lab Results  Component Value Date   CREATININE 0.74 08/03/2011   BUN 15 08/03/2011   NA 141 08/03/2011   K 5.4* 08/03/2011   CL 109 08/03/2011   CO2 27 08/03/2011   Lab Results  Component Value Date   ALT 19 08/03/2011   AST 19 08/03/2011   ALKPHOS 46 08/03/2011   BILITOT 0.3 08/03/2011   Lab Results  Component Value Date   CHOL 121 08/03/2011   Lab Results  Component Value Date   HDL 44 08/03/2011   Lab Results  Component Value Date   LDLCALC 57 08/03/2011   Lab Results  Component Value Date   TRIG 102 08/03/2011   Lab Results  Component Value Date   CHOLHDL 2.8 08/03/2011     Assessment & Plan  Plantar fasciitis, bilateral Pain is worsening now and is unable to continue standing for more than 4 hours. Pain is no longer confined to feet but starts at hips b/l.  Leg pain, bilateral Starts at b/l hips now and hurts to feet, legs are shaking due to pain. Check an xray of low back, check labs prior to visit. RF, ANA, CBC, ESR. Labs all normal and xray showssome arthritic changes and scoliosis. Try Wallene Huh and therapeutic Pilates and if no improvement may benefit from referral to ortho or neurosurgery

## 2013-03-06 ENCOUNTER — Encounter: Payer: Self-pay | Admitting: Family Medicine

## 2013-03-06 LAB — C-REACTIVE PROTEIN: CRP: <0.5 mg/dL (ref <0.60)

## 2013-03-06 LAB — ANA: Anti Nuclear Antibody(ANA): NEGATIVE

## 2013-03-06 NOTE — Telephone Encounter (Signed)
Please advise 

## 2013-04-22 ENCOUNTER — Other Ambulatory Visit: Payer: Self-pay | Admitting: Internal Medicine

## 2013-04-22 NOTE — Telephone Encounter (Signed)
[facility]Scribe request for refill on Alprazolam Last filled - 11.27.13, #30x1 Last AEX - 06.17.14 Please Advise on refills/SLS

## 2013-05-01 ENCOUNTER — Other Ambulatory Visit: Payer: Self-pay | Admitting: Internal Medicine

## 2013-05-01 NOTE — Telephone Encounter (Signed)
Refill request for Celexa Last filled by MD on 01/30/12 #90 x4 Last seen- 03/05/13 Next appt: none Please advise refill?

## 2013-06-03 ENCOUNTER — Encounter: Payer: Self-pay | Admitting: Physician Assistant

## 2013-06-03 ENCOUNTER — Ambulatory Visit (INDEPENDENT_AMBULATORY_CARE_PROVIDER_SITE_OTHER): Payer: Managed Care, Other (non HMO) | Admitting: Physician Assistant

## 2013-06-03 ENCOUNTER — Encounter: Payer: Self-pay | Admitting: Family Medicine

## 2013-06-03 VITALS — BP 107/82 | HR 69 | Temp 98.1°F | Resp 14 | Ht 64.75 in | Wt 141.5 lb

## 2013-06-03 DIAGNOSIS — R1013 Epigastric pain: Secondary | ICD-10-CM | POA: Insufficient documentation

## 2013-06-03 DIAGNOSIS — M722 Plantar fascial fibromatosis: Secondary | ICD-10-CM

## 2013-06-03 LAB — CBC WITH DIFFERENTIAL/PLATELET
Basophils Absolute: 0 10*3/uL (ref 0.0–0.1)
Basophils Relative: 0 % (ref 0–1)
Eosinophils Absolute: 0.1 10*3/uL (ref 0.0–0.7)
Eosinophils Relative: 2 % (ref 0–5)
HCT: 36.7 % (ref 36.0–46.0)
Hemoglobin: 12.7 g/dL (ref 12.0–15.0)
Lymphocytes Relative: 27 % (ref 12–46)
Lymphs Abs: 2.1 10*3/uL (ref 0.7–4.0)
MCH: 30.2 pg (ref 26.0–34.0)
MCHC: 34.6 g/dL (ref 30.0–36.0)
MCV: 87.4 fL (ref 78.0–100.0)
Monocytes Absolute: 0.6 10*3/uL (ref 0.1–1.0)
Monocytes Relative: 8 % (ref 3–12)
Neutro Abs: 4.9 10*3/uL (ref 1.7–7.7)
Neutrophils Relative %: 63 % (ref 43–77)
Platelets: 243 10*3/uL (ref 150–400)
RBC: 4.2 MIL/uL (ref 3.87–5.11)
RDW: 12.8 % (ref 11.5–15.5)
WBC: 7.8 10*3/uL (ref 4.0–10.5)

## 2013-06-03 MED ORDER — OMEPRAZOLE 20 MG PO CPDR: 20.0000 mg | DELAYED_RELEASE_CAPSULE | Freq: Every day | ORAL | Status: DC

## 2013-06-03 MED ORDER — TRAMADOL HCL 50 MG PO TABS: 50.0000 mg | ORAL_TABLET | Freq: Three times a day (TID) | ORAL | Status: DC | PRN

## 2013-06-03 NOTE — Telephone Encounter (Signed)
Please advise Tramadol RX? Last RX was done on 03-05-13 quantity 60 with 1 refill  If ok fax to [phone]

## 2013-06-03 NOTE — Patient Instructions (Addendum)
Please take prilosec daily for symptoms.  Please watch which foods may trigger symptoms.  I will call you with your lab results.  Try tylenol for headaches.  Continue to drink plenty of fluids.  Diet as tolerated.  Peptic Ulcer A peptic ulcer is a sore in the lining of in your esophagus (esophageal ulcer), stomach (gastric ulcer), or in the first part of your small intestine (duodenal ulcer). The ulcer causes erosion into the deeper tissue. CAUSES  Normally, the lining of the stomach and the small intestine protects itself from the acid that digests food. The protective lining can be damaged by:  An infection caused by a bacterium called Helicobacter pylori (H. pylori).  Regular use of nonsteroidal anti-inflammatory drugs (NSAIDs), such as ibuprofen or aspirin.  Smoking tobacco. Other risk factors include being older than 50, drinking alcohol excessively, and having a family history of ulcer disease.  SYMPTOMS   Burning pain or gnawing in the area between the chest and the belly button.  Heartburn.  Nausea and vomiting.  Bloating. The pain can be worse on an empty stomach and at night. If the ulcer results in bleeding, it can cause:  Black, tarry stools.  Vomiting of bright red blood.  Vomiting of coffee ground looking materials. DIAGNOSIS  A diagnosis is usually made based upon your history and an exam. Other tests and procedures may be performed to find the cause of the ulcer. Finding a cause will help determine the best treatment. Tests and procedures may include:  Blood tests, stool tests, or breath tests to check for the bacterium H. pylori.  An upper gastrointestinal (GI) series of the esophagus, stomach, and small intestine.  An endoscopy to examine the esophagus, stomach, and small intestine.  A biopsy. TREATMENT  Treatment may include:  Eliminating the cause of the ulcer, such as smoking, NSAIDs, or alcohol.  Medicines to reduce the amount of acid in your  digestive tract.  Antibiotic medicines if the ulcer is caused by the H. pylori bacterium.  An upper endoscopy to treat a bleeding ulcer.  Surgery if the bleeding is severe or if the ulcer created a hole somewhere in the digestive system. HOME CARE INSTRUCTIONS   Avoid tobacco, alcohol, and caffeine. Smoking can increase the acid in the stomach, and continued smoking will impair the healing of ulcers.  Avoid foods and drinks that seem to cause discomfort or aggravate your ulcer.  Only take medicines as directed by your caregiver. Do not substitute over-the-counter medicines for prescription medicines without talking to your caregiver.  Keep any follow-up appointments and tests as directed. SEEK MEDICAL CARE IF:   Your do not improve within 7 days of starting treatment.  You have ongoing indigestion or heartburn. SEEK IMMEDIATE MEDICAL CARE IF:   You have sudden, sharp, or persistent abdominal pain.  You have bloody or dark black, tarry stools.  You vomit blood or vomit that looks like coffee grounds.  You become light headed, weak, or feel faint.  You become sweaty or clammy. MAKE SURE YOU:   Understand these instructions.  Will watch your condition.  Will get help right away if you are not doing well or get worse. Document Released: 09/02/2000 Document Revised: 05/30/2012 Document Reviewed: 04/04/2012 [facility] Patient Information 2014 [place], [place].

## 2013-06-03 NOTE — Progress Notes (Signed)
Patient ID: Sara West, female   DOB: [birth year], 47 y.o.   MRN: [id number]  Patient presents to clinic today c/o epigastric pain for the past 4 days.  Patient states that she has noticed significant "gnawing" pain around 30 minutes after meals.  Pain is associated with some nausea but no emesis.  Pain is non-radiating.  Patient denies D/C, melena or hematochezia.  Patient denies history of acid reflux, stomach ulcer, GI bleed.  Has history of cholecystectomy and 2 c-sections.  Denies other abdominal surgery.  Last bowel movement this am.  Patient denies alcohol consumption or NSAID use. Endorses significant increase in stress at work.  Denies change to diet.  Past Medical History  Diagnosis Date  . ANXIETY STATE NEC 05/22/2007  . PSORIASIS 04/24/2007  . INSOMNIA-SLEEP DISORDER-UNSPEC 12/10/2009  . Plantar fasciitis, bilateral 03/05/2013  . Leg pain, bilateral 03/05/2013    Current Outpatient Prescriptions on File Prior to Visit  Medication Sig Dispense Refill  . ALPRAZolam (XANAX) 0.25 MG tablet TAKE 1 TABLET BY MOUTH AT BEDTIME AS NEEDED FOR ANXIETY  30 tablet  1  . butalbital-acetaminophen-caffeine (FIORICET) 50-325-40 MG per tablet Take 1 tablet by mouth every 4 (four) hours as needed for headache. maximum 6 tablets in a 24 hours period  30 tablet  0  . citalopram (CELEXA) 10 MG tablet Take 1 tablet (10 mg total) by mouth daily.  90 tablet  3  . HYDROcodone-acetaminophen (NORCO/VICODIN) 5-325 MG per tablet Take 1 tablet by mouth every 6 (six) hours as needed for pain.  60 tablet  1  . Multiple Vitamins-Minerals (WOMENS MULTIVITAMIN PLUS) TABS Take 1 each by mouth daily. ALIVE NATURAL MVI.      Marland Kitchen traMADol (ULTRAM) 50 MG tablet Take 1 tablet (50 mg total) by mouth 3 (three) times daily as needed for pain.  60 tablet  1   No current facility-administered medications on file prior to visit.    No Known Allergies  Family History  Problem Relation Age of Onset  . Alcohol abuse Mother      History   Social History  . Marital Status: Married    Spouse Name: N/A    Number of Children: N/A  . Years of Education: N/A   Occupational History  . 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 Smoking status: Former Games developer  . Smokeless tobacco: Never Used  . Alcohol Use: Yes  . Drug Use: No  . Sexual Activity: None   Other Topics Concern  . None   Social History Narrative  . None   Review of Systems  Constitutional: Negative for fever, chills and weight loss.  Cardiovascular: Negative for chest pain and palpitations.  Gastrointestinal: Positive for nausea and abdominal pain. Negative for heartburn, vomiting, diarrhea, constipation, blood in stool and melena.  Genitourinary: Negative for dysuria, urgency, frequency, hematuria and flank pain.  Musculoskeletal: Negative for myalgias.  Neurological: Positive for headaches.   Filed Vitals:   06/03/13 1317  BP: 107/82  Pulse: 69  Temp: 98.1 F (36.7 C)  Resp: 14   Physical Exam  Vitals reviewed. Constitutional: She is oriented to person, place, and time and well-developed, well-nourished, and in no distress.  HENT:  Head: Normocephalic.  Eyes: Conjunctivae are normal.  Cardiovascular: Normal rate, regular rhythm, normal heart sounds and intact distal pulses.   Pulmonary/Chest: Effort normal and breath sounds normal.  Abdominal: Soft. Bowel sounds are normal. She exhibits no distension and no mass. There  is no rebound and no guarding.  Moderate epigastric tenderness to palpation  Neurological: She is alert and oriented to person, place, and time.  Skin: Skin is warm and dry. No rash noted.   Assessment/Plan: Abdominal pain, epigastric Will obtain CBC, CMET, lipase, h. Pylori screening.  Rx Prilosec.  Monitor diet.  Avoid NSAID use or alcohol consumption.

## 2013-06-03 NOTE — Assessment & Plan Note (Signed)
Will obtain CBC, CMET, lipase, h. Pylori screening.  Rx Prilosec.  Monitor diet.  Avoid NSAID use or alcohol consumption.

## 2013-06-03 NOTE — Telephone Encounter (Signed)
Given to patient during visit with Selena Batten today

## 2013-06-03 NOTE — Addendum Note (Signed)
Addended by: Court Joy on: 06/03/2013 01:25 PM   Modules accepted: Orders

## 2013-06-04 LAB — COMPREHENSIVE METABOLIC PANEL
ALT: 15 U/L (ref 0–35)
AST: 17 U/L (ref 0–37)
Albumin: 4.5 g/dL (ref 3.5–5.2)
Alkaline Phosphatase: 39 U/L (ref 39–117)
BUN: 17 mg/dL (ref 6–23)
CO2: 29 mEq/L (ref 19–32)
Calcium: 10.1 mg/dL (ref 8.4–10.5)
Chloride: 101 mEq/L (ref 96–112)
Creat: 1.07 mg/dL (ref 0.50–1.10)
Glucose, Bld: 96 mg/dL (ref 70–99)
Potassium: 4.8 mEq/L (ref 3.5–5.3)
Sodium: 138 mEq/L (ref 135–145)
Total Bilirubin: 0.2 mg/dL — ABNORMAL LOW (ref 0.3–1.2)
Total Protein: 7.2 g/dL (ref 6.0–8.3)

## 2013-06-04 LAB — H. PYLORI BREATH TEST: H. pylori Breath Test: NEGATIVE

## 2013-06-04 LAB — LIPASE: Lipase: 48 U/L (ref 0–75)

## 2013-06-18 ENCOUNTER — Telehealth: Payer: Self-pay | Admitting: *Deleted

## 2013-06-18 ENCOUNTER — Other Ambulatory Visit: Payer: Managed Care, Other (non HMO)

## 2013-06-18 DIAGNOSIS — Z1211 Encounter for screening for malignant neoplasm of colon: Secondary | ICD-10-CM

## 2013-06-18 LAB — FECAL OCCULT BLOOD, IMMUNOCHEMICAL
Fecal Occult Bld: NEGATIVE
Fecal Occult Bld: NEGATIVE

## 2013-06-18 NOTE — Telephone Encounter (Signed)
Received call from [facility] requesting order be placed for IFOB. Order placed.

## 2013-07-28 ENCOUNTER — Other Ambulatory Visit: Payer: Self-pay | Admitting: Family Medicine

## 2013-07-29 NOTE — Telephone Encounter (Signed)
Request for alprazolam 0.25mg . Last rx sent 04/22/13, #30 x 1 refill. Rx printed and forwarded to Provider for signature.

## 2013-09-23 ENCOUNTER — Encounter: Payer: Self-pay | Admitting: Family Medicine

## 2013-09-23 ENCOUNTER — Other Ambulatory Visit: Payer: Self-pay | Admitting: Family Medicine

## 2013-09-23 DIAGNOSIS — F329 Major depressive disorder, single episode, unspecified: Secondary | ICD-10-CM

## 2013-09-23 DIAGNOSIS — F32A Depression, unspecified: Secondary | ICD-10-CM

## 2013-09-23 MED ORDER — CITALOPRAM HYDROBROMIDE 20 MG PO TABS: 20.0000 mg | ORAL_TABLET | Freq: Every day | ORAL | Status: DC

## 2013-10-16 ENCOUNTER — Telehealth: Payer: Self-pay | Admitting: Family Medicine

## 2013-10-16 MED ORDER — ALPRAZOLAM 0.25 MG PO TABS: ORAL_TABLET | ORAL | Status: DC

## 2013-10-16 NOTE — Telephone Encounter (Signed)
Request refill on Alprazolam 0.25mg  tab, take 1by mouth at bedtime at bedtime as needed for anxiety  Last fill date: 08-29-2013

## 2013-10-16 NOTE — Telephone Encounter (Signed)
Rx printed and forwarded to Provider for signature. 

## 2013-10-17 NOTE — Telephone Encounter (Signed)
RX sent

## 2013-10-21 ENCOUNTER — Telehealth: Payer: Self-pay | Admitting: Family Medicine

## 2013-10-21 DIAGNOSIS — M722 Plantar fascial fibromatosis: Secondary | ICD-10-CM

## 2013-10-21 NOTE — Telephone Encounter (Signed)
refill-tramadol hcl 50mg  tablet. Take one tablet by mouth three times a day as needed for pain

## 2013-10-21 NOTE — Telephone Encounter (Signed)
Please advise Tramadol refill? Last RX was done on 06-03-13 quantity 60 with 1 refill  If ok fax to [phone]

## 2013-10-21 NOTE — Telephone Encounter (Signed)
OK to refill Tramadol 50 mg 1 tab po tid prn pain, disp #90 0rf

## 2013-10-22 MED ORDER — TRAMADOL HCL 50 MG PO TABS: 50.0000 mg | ORAL_TABLET | Freq: Three times a day (TID) | ORAL | Status: DC | PRN

## 2013-10-22 NOTE — Telephone Encounter (Signed)
RX faxed

## 2013-11-19 ENCOUNTER — Encounter (HOSPITAL_COMMUNITY): Payer: Self-pay | Admitting: Emergency Medicine

## 2013-11-19 ENCOUNTER — Emergency Department (HOSPITAL_COMMUNITY)
Admission: EM | Admit: 2013-11-19 | Discharge: 2013-11-20 | Disposition: A | Discharge status: Home / Self Care | Payer: Managed Care, Other (non HMO) | Attending: Emergency Medicine | Admitting: Emergency Medicine

## 2013-11-19 ENCOUNTER — Emergency Department (HOSPITAL_COMMUNITY): Payer: Managed Care, Other (non HMO)

## 2013-11-19 DIAGNOSIS — M722 Plantar fascial fibromatosis: Secondary | ICD-10-CM | POA: Insufficient documentation

## 2013-11-19 DIAGNOSIS — L408 Other psoriasis: Secondary | ICD-10-CM | POA: Insufficient documentation

## 2013-11-19 DIAGNOSIS — Z79899 Other long term (current) drug therapy: Secondary | ICD-10-CM | POA: Insufficient documentation

## 2013-11-19 DIAGNOSIS — G4700 Insomnia, unspecified: Secondary | ICD-10-CM | POA: Insufficient documentation

## 2013-11-19 DIAGNOSIS — M79609 Pain in unspecified limb: Secondary | ICD-10-CM | POA: Insufficient documentation

## 2013-11-19 DIAGNOSIS — Z87891 Personal history of nicotine dependence: Secondary | ICD-10-CM | POA: Insufficient documentation

## 2013-11-19 DIAGNOSIS — R079 Chest pain, unspecified: Secondary | ICD-10-CM | POA: Insufficient documentation

## 2013-11-19 DIAGNOSIS — F489 Nonpsychotic mental disorder, unspecified: Secondary | ICD-10-CM | POA: Insufficient documentation

## 2013-11-19 LAB — BASIC METABOLIC PANEL
BUN: 11 mg/dL (ref 6–23)
CO2: 24 mEq/L (ref 19–32)
Calcium: 9.5 mg/dL (ref 8.4–10.5)
Chloride: 101 mEq/L (ref 96–112)
Creatinine, Ser: 0.64 mg/dL (ref 0.50–1.10)
GFR calc Af Amer: >90 mL/min (ref >90)
GFR calc non Af Amer: >90 mL/min (ref >90)
Glucose, Bld: 105 mg/dL — ABNORMAL HIGH (ref 70–99)
Potassium: 3.9 mEq/L (ref 3.7–5.3)
Sodium: 141 mEq/L (ref 137–147)

## 2013-11-19 LAB — I-STAT TROPONIN, ED: Troponin i, poc: 0 ng/mL (ref 0.00–0.08)

## 2013-11-19 LAB — CBC
HCT: 35.7 % — ABNORMAL LOW (ref 36.0–46.0)
Hemoglobin: 12.5 g/dL (ref 12.0–15.0)
MCH: 30.7 pg (ref 26.0–34.0)
MCHC: 35 g/dL (ref 30.0–36.0)
MCV: 87.7 fL (ref 78.0–100.0)
Platelets: 236 10*3/uL (ref 150–400)
RBC: 4.07 MIL/uL (ref 3.87–5.11)
RDW: 12 % (ref 11.5–15.5)
WBC: 11.1 10*3/uL — ABNORMAL HIGH (ref 4.0–10.5)

## 2013-11-19 IMAGING — CR DG CHEST 2V
2 series · 2 of 2 positions shown · non-contrast
Comparison: None.

CLINICAL DATA: Left chest pain. Remote history of cardiac ablation.

EXAM:
CHEST  2 VIEW

[w chest pa]
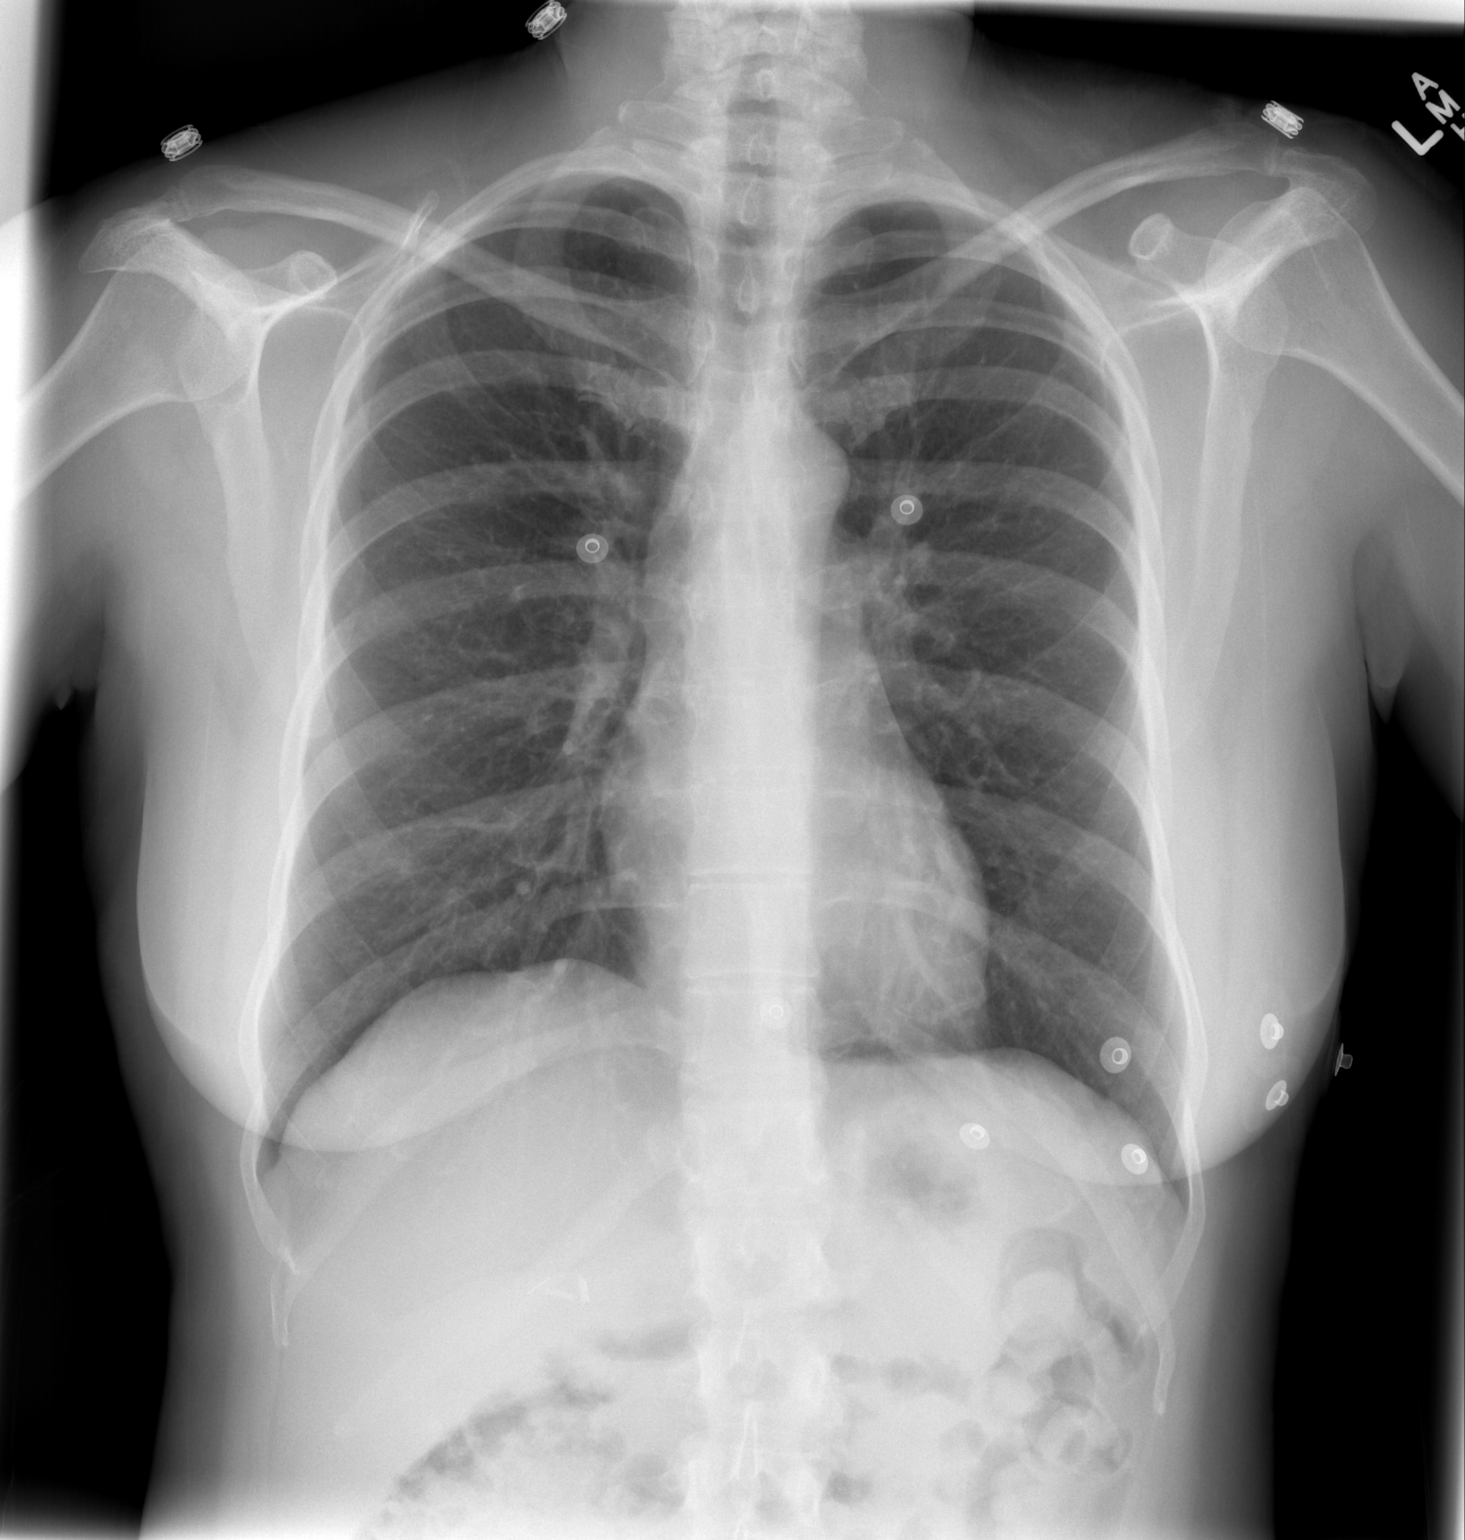

[w chest lat]
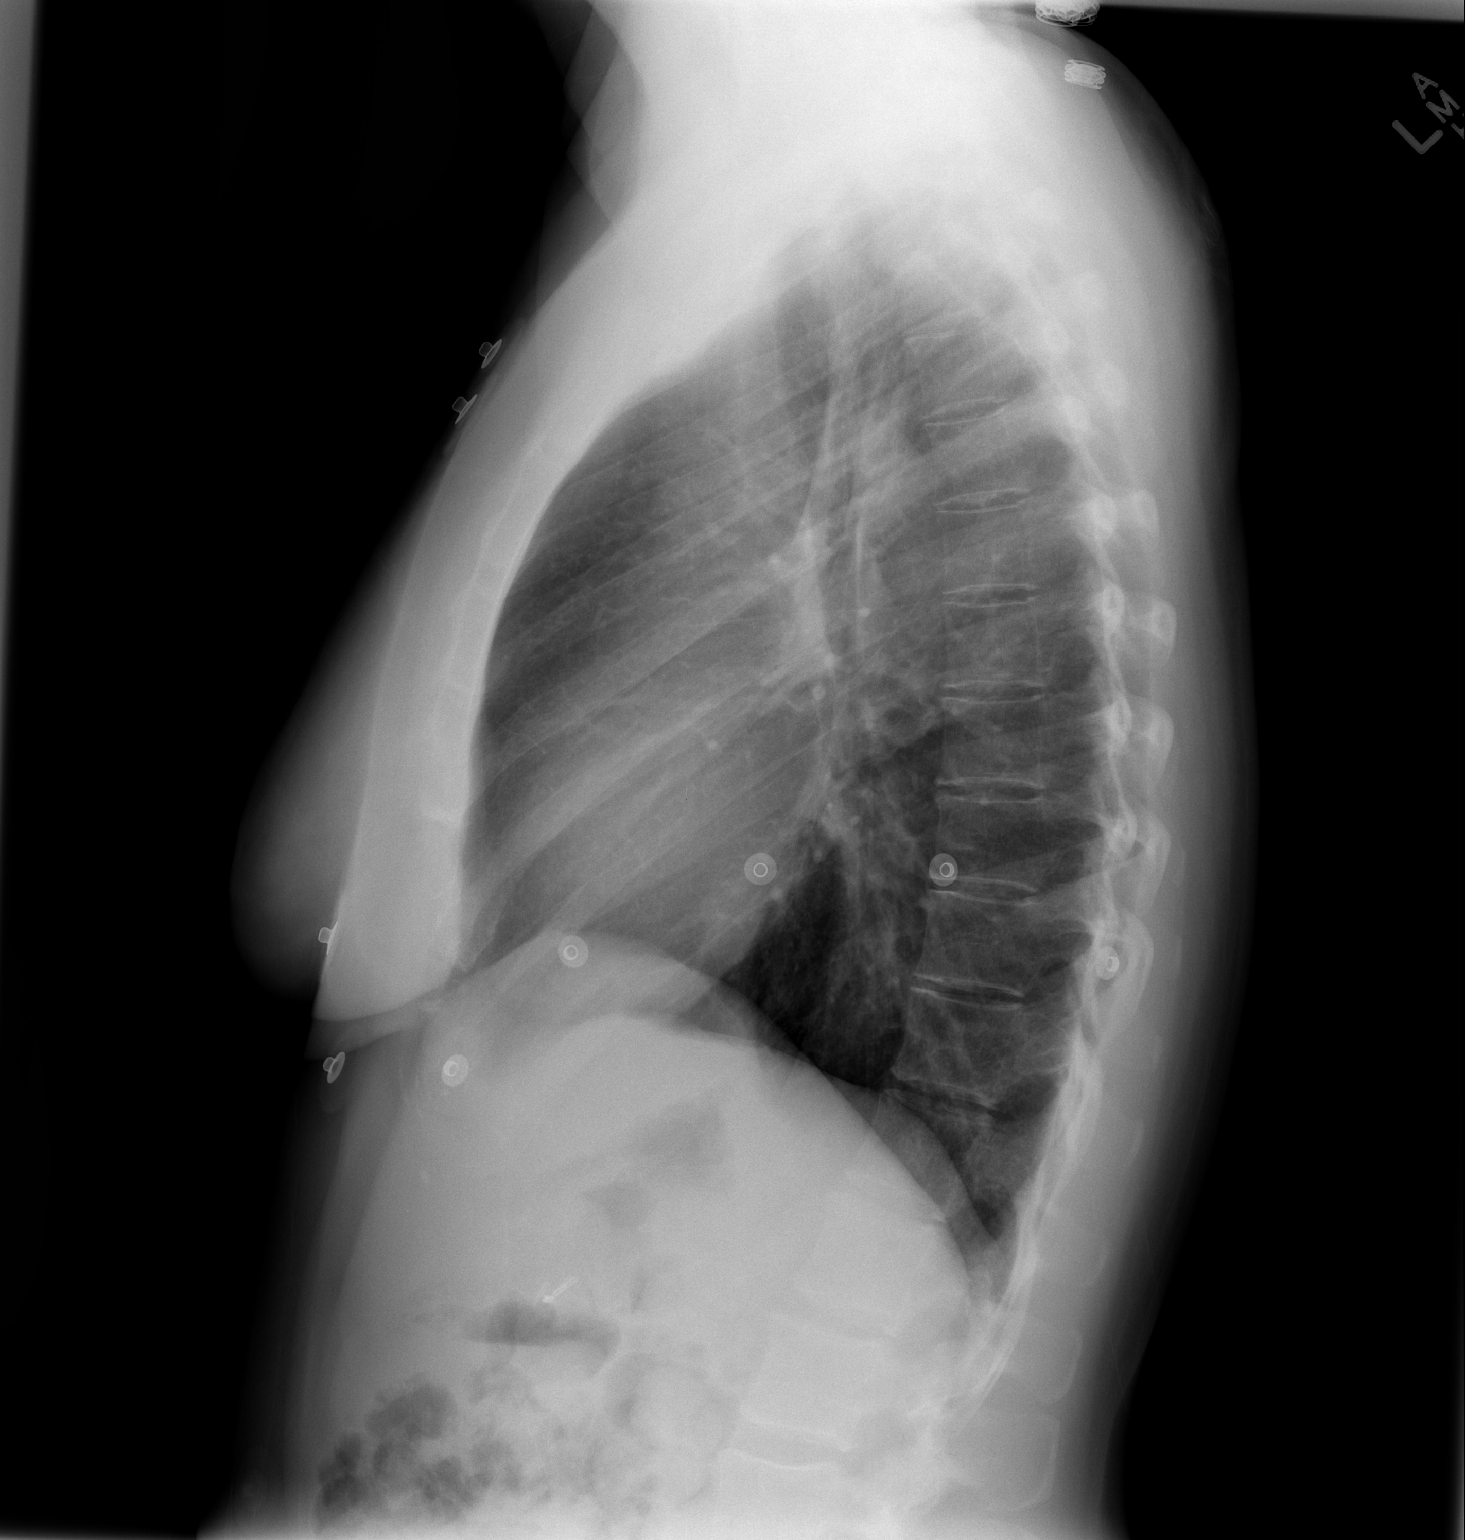

[2 of 2 positions shown; findings below may reference images not displayed]

FINDINGS: Normal heart size and mediastinal contours. No acute infiltrate or
edema. No effusion or pneumothorax. No acute osseous findings.
Cholecystectomy.
IMPRESSION: No active cardiopulmonary disease.

## 2013-11-19 MED ORDER — SODIUM CHLORIDE 0.9 % IV BOLUS (SEPSIS)
1000.0000 mL | Freq: Once | INTRAVENOUS | Status: AC
  Administered 2013-11-20: 1000 mL via INTRAVENOUS

## 2013-11-19 MED ORDER — ACETAMINOPHEN 325 MG PO TABS
650.0000 mg | ORAL_TABLET | Freq: Once | ORAL | Status: AC
  Administered 2013-11-20: 650 mg via ORAL
  Filled 2013-11-19: qty 2

## 2013-11-19 NOTE — ED Notes (Signed)
Pt reports increase in pain; appears very anxious and tearful. Pt placed on 2L for comfort measures. MD Ward at bedside.

## 2013-11-19 NOTE — ED Notes (Addendum)
Per EMS: Pt from home, sitting in bathtub when she had sudden onset of left sided "crushing" CP radiating to left arm lasting appx 20 min starting at 1930. Resolving on it's own. Pt reports 3 episodes of same. Pt reports SOB. Denies N/V/diaphoresis. Pt reports increased stress at work; reports decrease PO intake. AO x4. 116/76. 98 NSR; EKG unremarkable. 98% RA. Hx: V Tach, ablation Given 324 aspirin

## 2013-11-19 NOTE — ED Notes (Signed)
Pt reports intermittent left sided CP that increases to to 8/10 "squeezing" pain that lasts appx 20 minutes, resolves on it's own. Pt reports SOB with pain. Denies anything makes pain better or worse. Pt now resting comfortably in bed, denies any pain. AO x4.   Pt reports increase stress at work and with family. States "I haven't been resting very well, and I am not eating like I am suppose to." Family now at bedside

## 2013-11-19 NOTE — ED Provider Notes (Signed)
TIME SEEN: 10:10 PM  CHIEF COMPLAINT: Chest pain, shortness of breath  HPI: Patient is a 48 y.o. F with a history of anxiety, insomnia who presents to the emergency department with substernal chest pressure that radiates into her left arm and jaw that started this evening just prior to arrival while taking a bath. She has shortness of breath dosage dizziness no nausea, vomiting or diaphoresis.  She denies any fever or cough. No prior history of PE or DVT. She has had prior ventricular tachycardia status post ablation greater than 20 years ago.  She denies any lower extremity swelling or pain, she is not on exogenous estrogen. She used to smoke but quit over 8 years ago. She does not have a history of diabetes, hypertension, hyperlipidemia or have a family history of CAD. She denies any recent surgery, trauma, fracture, hospitalization or a long flight. Her chest pain comes in waves and lasts for several minutes and then completely resolved without intervention.  ROS: See HPI Constitutional: no fever  Eyes: no drainage  ENT: no runny nose   Cardiovascular:   chest pain  Resp:  SOB  GI: no vomiting GU: no dysuria Integumentary: no rash  Allergy: no hives  Musculoskeletal: no leg swelling  Neurological: no slurred speech ROS otherwise negative  PAST MEDICAL HISTORY/PAST SURGICAL HISTORY:  Past Medical History  Diagnosis Date  . ANXIETY STATE NEC 05/22/2007  . PSORIASIS 04/24/2007  . INSOMNIA-SLEEP DISORDER-UNSPEC 12/10/2009  . Plantar fasciitis, bilateral 03/05/2013  . Leg pain, bilateral 03/05/2013    MEDICATIONS:  Prior to Admission medications   Medication Sig Start Date End Date Taking? Authorizing Provider  ALPRAZolam Prudy Feeler) 0.25 MG tablet TAKE 1 TABLET BY MOUTH AT BEDTIME AS NEEDED FOR ANXIETY 10/16/13  Yes Bradd Canary, MD  citalopram (CELEXA) 20 MG tablet Take 1 tablet (20 mg total) by mouth daily. 09/23/13  Yes Bradd Canary, MD  Multiple Vitamins-Minerals (WOMENS MULTIVITAMIN  PLUS) TABS Take 1 each by mouth daily. ALIVE NATURAL MVI.   Yes Historical Provider, MD  traMADol (ULTRAM) 50 MG tablet Take 50 mg by mouth 3 (three) times daily as needed for moderate pain. 10/22/13  Yes Bradd Canary, MD    ALLERGIES:  No Known Allergies  SOCIAL HISTORY:  History  Substance Use Topics  . Smoking status: Former Smoker    Types: Cigarettes    Quit date: 11/19/2005  . Smokeless tobacco: Never Used  . Alcohol Use: Yes    FAMILY HISTORY: Family History  Problem Relation Age of Onset  . Alcohol abuse Mother     EXAM: BP 105/52  Pulse 104  Temp(Src) 98.3 F (36.8 C) (Oral)  Resp 20  Ht 5\' 4"  (1.626 m)  Wt 138 lb (62.596 kg)  BMI 23.68 kg/m2  SpO2 99%  LMP 11/19/2013 CONSTITUTIONAL: Alert and oriented and responds appropriately to questions. Well-appearing; well-nourished HEAD: Normocephalic EYES: Conjunctivae clear, PERRL ENT: normal nose; no rhinorrhea; moist mucous membranes; pharynx without lesions noted NECK: Supple, no meningismus, no LAD  CARD: RRR; S1 and S2 appreciated; no murmurs, no clicks, no rubs, no gallops RESP: Normal chest excursion without splinting or tachypnea; breath sounds clear and equal bilaterally; no wheezes, no rhonchi, no rales,  ABD/GI: Normal bowel sounds; non-distended; soft, non-tender, no rebound, no guarding BACK:  The back appears normal and is non-tender to palpation, there is no CVA tenderness EXT: Normal ROM in all joints; non-tender to palpation; no edema; normal capillary refill; no cyanosis    SKIN:  Normal color for age and race; warm NEURO: Moves all extremities equally PSYCH: The patient's mood and manner are appropriate. Grooming and personal hygiene are appropriate.  MEDICAL DECISION MAKING: Patient here with concerning story for chest pain with no risk factors for ACS or pulmonary embolus other than a prior history of tobacco use.  We'll obtain cardiac labs, EKG, chest x-ray, d-dimer. Patient has received aspirin  with EMS. She is currently pain-free.  ED PROGRESS: Patient's EKG shows no ischemic changes.  D dimer negative.  Will repeat troponin at 1:30 AM (symptoms started at 7:30pm).  If negative, will dc home.  Signed out to Dr. Romeo AppleHarrison who will follow up on second troponin.    EKG Interpretation  Date/Time:  Tuesday November 19 2013 21:33:40 EST Ventricular Rate:  88 PR Interval:  158 QRS Duration: 79 QT Interval:  354 QTC Calculation: 428 R Axis:   69 Text Interpretation:  Sinus rhythm Confirmed by Myles Tavella,  DO, Brittainy Bucker [phone]([id number]) on 11/19/2013 9:36:53 PM           Layla MawKristen N Jalyiah Shelley, DO 11/20/13 0040

## 2013-11-20 ENCOUNTER — Encounter (HOSPITAL_COMMUNITY): Payer: Self-pay | Admitting: Emergency Medicine

## 2013-11-20 LAB — D-DIMER, QUANTITATIVE (NOT AT ARMC): D-Dimer, Quant: <0.27 ug/mL-FEU (ref 0.00–0.48)

## 2013-11-20 LAB — TROPONIN I: Troponin I: <0.3 ng/mL (ref <0.30)

## 2013-11-20 NOTE — ED Notes (Signed)
Pt given Mal[place]key sandwich but stated "I dont eat sandwich meat" and so pt was given crackers and peanut butter.

## 2013-11-20 NOTE — ED Notes (Signed)
Pt requesting food. Informed patient that Dr. Romeo AppleHarrison states he wants the recent troponin results back before he allows her to eat. Updated patient and patient verbalizes understanding.

## 2013-11-20 NOTE — Discharge Instructions (Signed)
Chest Pain (Nonspecific) °It is often hard to give a specific diagnosis for the cause of chest pain. There is always a chance that your pain could be related to something serious, such as a heart attack or a blood clot in the lungs. You need to follow up with your caregiver for further evaluation. °CAUSES  °· Heartburn. °· Pneumonia or bronchitis. °· Anxiety or stress. °· Inflammation around your heart (pericarditis) or lung (pleuritis or pleurisy). °· A blood clot in the lung. °· A collapsed lung (pneumothorax). It can develop suddenly on its own (spontaneous pneumothorax) or from injury (trauma) to the chest. °· Shingles infection (herpes zoster virus). °The chest wall is composed of bones, muscles, and cartilage. Any of these can be the source of the pain. °· The bones can be bruised by injury. °· The muscles or cartilage can be strained by coughing or overwork. °· The cartilage can be affected by inflammation and become sore (costochondritis). °DIAGNOSIS  °Lab tests or other studies, such as X-rays, electrocardiography, stress testing, or cardiac imaging, may be needed to find the cause of your pain.  °TREATMENT  °· Treatment depends on what may be causing your chest pain. Treatment may include: °· Acid blockers for heartburn. °· Anti-inflammatory medicine. °· Pain medicine for inflammatory conditions. °· Antibiotics if an infection is present. °· You may be advised to change lifestyle habits. This includes stopping smoking and avoiding alcohol, caffeine, and chocolate. °· You may be advised to keep your head raised (elevated) when sleeping. This reduces the chance of acid going backward from your stomach into your esophagus. °· Most of the time, nonspecific chest pain will improve within 2 to 3 days with rest and mild pain medicine. °HOME CARE INSTRUCTIONS  °· If antibiotics were prescribed, take your antibiotics as directed. Finish them even if you start to feel better. °· For the next few days, avoid physical  activities that bring on chest pain. Continue physical activities as directed. °· Do not smoke. °· Avoid drinking alcohol. °· Only take over-the-counter or prescription medicine for pain, discomfort, or fever as directed by your caregiver. °· Follow your caregiver's suggestions for further testing if your chest pain does not go away. °· Keep any follow-up appointments you made. If you do not go to an appointment, you could develop lasting (chronic) problems with pain. If there is any problem keeping an appointment, you must call to reschedule. °SEEK MEDICAL CARE IF:  °· You think you are having problems from the medicine you are taking. Read your medicine instructions carefully. °· Your chest pain does not go away, even after treatment. °· You develop a rash with blisters on your chest. °SEEK IMMEDIATE MEDICAL CARE IF:  °· You have increased chest pain or pain that spreads to your arm, neck, jaw, back, or abdomen. °· You develop shortness of breath, an increasing cough, or you are coughing up blood. °· You have severe back or abdominal pain, feel nauseous, or vomit. °· You develop severe weakness, fainting, or chills. °· You have a fever. °THIS IS AN EMERGENCY. Do not wait to see if the pain will go away. Get medical help at once. Call your local emergency services (911 in [place] Do not drive yourself to the hospital. °MAKE SURE YOU:  °· Understand these instructions. °· Will watch your condition. °· Will get help right away if you are not doing well or get worse. °Document Released: 06/15/2005 Document Revised: 11/28/2011 Document Reviewed: 04/10/2008 °ExitCare® Patient Information ©2014 [employer]   LL[employer]. Possible Panic Attacks Panic attacks are sudden, short-livedsurges of severe anxiety, fear, or discomfort. They may occur for no reason when you are relaxed, when you are anxious, or when you are sleeping. Panic attacks may occur for a number of reasons:   Healthy people occasionally have panic attacks in  extreme, life-threatening situations, such as war or natural disasters. Normal anxiety is a protective mechanism of the body that helps us react to danger (fight or flight response).  Panic attacks are often seen with anxiety disorders, such as panic disorder, social anxiety disorder, generalized anxiety disorder, and phobias. Anxiety disorders cause excessive or uncontrollable anxiety. They may interfere with your relationships or other life activities.  Panic attacks are sometimes seen with other mental illnesses such as depression and posttraumatic stress disorder.  Certain medical conditions, prescription medicines, and drugs of abuse can cause panic attacks. SYMPTOMS  Panic attacks start suddenly, peak within 20 minutes, and are accompanied by four or more of the following symptoms:  Pounding heart or fast heart rate (palpitations).  Sweating.  Trembling or shaking.  Shortness of breath or feeling smothered.  Feeling choked.  Chest pain or discomfort.  Nausea or strange feeling in your stomach.  Dizziness, lightheadedness, or feeling like you will faint.  Chills or hot flushes.  Numbness or tingling in your lips or hands and feet.  Feeling that things are not real or feeling that you are not yourself.  Fear of losing control or going crazy.  Fear of dying. Some of these symptoms can mimic serious medical conditions. For example, you may think you are having a heart attack. Although panic attacks can be very scary, they are not life threatening. DIAGNOSIS  Panic attacks are diagnosed through an assessment by your health care provider. Your health care provider will ask questions about your symptoms, such as where and when they occurred. Your health care provider will also ask about your medical history and use of alcohol and drugs, including prescription medicines. Your health care provider may order blood tests or other studies to rule out a serious medical condition. Your  health care provider may refer you to a mental health professional for further evaluation. TREATMENT   Most healthy people who have one or two panic attacks in an extreme, life-threatening situation will not require treatment.  The treatment for panic attacks associated with anxiety disorders or other mental illness typically involves counseling with a mental health professional, medicine, or a combination of both. Your health care provider will help determine what treatment is best for you.  Panic attacks due to physical illness usually goes away with treatment of the illness. If prescription medicine is causing panic attacks, talk with your health care provider about stopping the medicine, decreasing the dose, or substituting another medicine.  Panic attacks due to alcohol or drug abuse goes away with abstinence. Some adults need professional help in order to stop drinking or using drugs. HOME CARE INSTRUCTIONS   Take all your medicines as prescribed.   Check with your health care provider before starting new prescription or over-the-counter medicines.  Keep all follow up appointments with your health care provider. SEEK MEDICAL CARE IF:  You are not able to take your medicines as prescribed.  Your symptoms do not improve or get worse. SEEK IMMEDIATE MEDICAL CARE IF:   You experience panic attack symptoms that are different than your usual symptoms.  You have serious thoughts about hurting yourself or others.  You are taking medicine for  panic attacks and have a serious side effect. °MAKE SURE YOU: °· Understand these instructions. °· Will watch your condition. °· Will get help right away if you are not doing well or get worse. °Document Released: 09/05/2005 Document Revised: 06/26/2013 Document Reviewed: 04/19/2013 °ExitCare® Patient Information ©2014 [employer]. ° °

## 2013-11-22 ENCOUNTER — Other Ambulatory Visit: Payer: Self-pay | Admitting: Family Medicine

## 2013-11-22 ENCOUNTER — Ambulatory Visit (INDEPENDENT_AMBULATORY_CARE_PROVIDER_SITE_OTHER): Payer: Managed Care, Other (non HMO) | Admitting: Family Medicine

## 2013-11-22 ENCOUNTER — Encounter: Payer: Self-pay | Admitting: Family Medicine

## 2013-11-22 ENCOUNTER — Telehealth: Payer: Self-pay | Admitting: Family Medicine

## 2013-11-22 VITALS — BP 100/72 | HR 69 | Temp 98.2°F | Ht 64.75 in | Wt 139.0 lb

## 2013-11-22 DIAGNOSIS — F319 Bipolar disorder, unspecified: Secondary | ICD-10-CM

## 2013-11-22 MED ORDER — ARIPIPRAZOLE 2 MG PO TABS: 2.0000 mg | ORAL_TABLET | Freq: Every day | ORAL | Status: DC

## 2013-11-22 MED ORDER — ALPRAZOLAM 0.5 MG PO TABS: 0.5000 mg | ORAL_TABLET | Freq: Two times a day (BID) | ORAL | Status: DC | PRN

## 2013-11-22 NOTE — Telephone Encounter (Signed)
PA for Abilify 2mg  received, form forward to nurse

## 2013-11-22 NOTE — Progress Notes (Signed)
Pre visit review using our clinic review tool, if applicable. No additional management support is needed unless otherwise documented below in the visit note. 

## 2013-11-22 NOTE — Patient Instructions (Signed)
1/2 of a 0.25 mg in am and a 0.5 during a panic attack or at night  Bipolar Disorder Bipolar disorder is a mental illness. The term bipolar disorder actually is used to describe a group of disorders that all share varying degrees of emotional highs and lows that can interfere with daily functioning, such as work, school, or relationships. Bipolar disorder also can lead to drug abuse, hospitalization, and suicide. The emotional highs of bipolar disorder are periods of elation or irritability and high energy. These highs can range from a mild form (hypomania) to a severe form (mania). People experiencing episodes of hypomania may appear energetic, excitable, and highly productive. People experiencing mania may behave impulsively or erratically. They often make poor decisions. They may have difficulty sleeping. The most severe episodes of mania can involve having very distorted beliefs or perceptions about the world and seeing or hearing things that are not real (psychotic delusions and hallucinations).  The emotional lows of bipolar disorder (depression) also can range from mild to severe. Severe episodes of bipolar depression can involve psychotic delusions and hallucinations. Sometimes people with bipolar disorder experience a state of mixed mood. Symptoms of hypomania or mania and depression are both present during this mixed-mood episode. SIGNS AND SYMPTOMS There are signs and symptoms of the episodes of hypomania and mania as well as the episodes of depression. The signs and symptoms of hypomania and mania are similar but vary in severity. They include:  Inflated self-esteem or feeling of increased self-confidence.  Decreased need for sleep.  Unusual talkativeness (rapid or pressured speech) or the feeling of a need to keep talking.  Sensation of racing thoughts or constant talking, with quick shifts between topics that may or may not be related (flight of ideas).  Decreased ability to focus or  concentrate.  Increased purposeful activity, such as work, studies, or social activity, or nonproductive activity, such as pacing, squirming and fidgeting, or finger and toe tapping.  Impulsive behavior and use of poor judgment, resulting in high-risk activities, such as having unprotected sex or spending excessive amounts of money. Signs and symptoms of depression include the following:   Feelings of sadness, hopelessness, or helplessness.  Frequent or uncontrollable episodes of crying.  Lack of feeling anything or caring about anything.  Difficulty sleeping or sleeping too much.  Inability to enjoy the things you used to enjoy.   Desire to be alone all the time.   Feelings of guilt or worthlessness.  Lack of energy or motivation.   Difficulty concentrating, remembering, or making decisions.  Change in appetite or weight beyond normal fluctuations.  Thoughts of death or the desire to harm yourself. DIAGNOSIS  Bipolar disorder is diagnosed through an assessment by your caregiver. Your caregiver will ask questions about your emotional episodes. There are two main types of bipolar disorder. People with type I bipolar disorder have manic episodes with or without depressive episodes. People with type II bipolar disorder have hypomanic episodes and major depressive episodes, which are more serious than mild depression. The type of bipolar disorder you have can make an important difference in how your illness is monitored and treated. Your caregiver may ask questions about your medical history and use of alcohol or drugs, including prescription medication. Certain medical conditions and substances also can cause emotional highs and lows that resemble bipolar disorder (secondary bipolar disorder).  TREATMENT  Bipolar disorder is a long-term illness. It is best controlled with continuous treatment rather than treatment only when symptoms occur. The  following treatments can be prescribed  for bipolar disorders:  Medication Medication can be prescribed by a doctor that is an expert in treating mental disorders (psychiatrists). Medications called mood stabilizers are usually prescribed to help control the illness. Other medications are sometimes added if symptoms of mania, depression, or psychotic delusions and hallucinations occur despite the use of a mood stabilizer.  Talk therapy Some forms of talk therapy are helpful in providing support, education, and guidance. A combination of medication and talk therapy is best for managing the disorder over time. A procedure in which electricity is applied to your brain through your scalp (electroconvulsive therapy) is used in cases of severe mania when medication and talk therapy do not work or work too slowly. Document Released: 12/12/2000 Document Revised: 12/31/2012 Document Reviewed: 10/01/2012 [facility] Patient Information 2014 [place], [place]. Generalized Anxiety Disorder Generalized anxiety disorder (GAD) is a mental disorder. It interferes with life functions, including relationships, work, and school. GAD is different from normal anxiety, which everyone experiences at some point in their lives in response to specific life events and activities. Normal anxiety actually helps [name] prepare for and get through these life events and activities. Normal anxiety goes away after the event or activity is over.  GAD causes anxiety that is not necessarily related to specific events or activities. It also causes excess anxiety in proportion to specific events or activities. The anxiety associated with GAD is also difficult to control. GAD can vary from mild to severe. People with severe GAD can have intense waves of anxiety with physical symptoms (panic attacks).  SYMPTOMS The anxiety and worry associated with GAD are difficult to control. This anxiety and worry are related to many life events and activities and also occur more days than not for 6  months or longer. People with GAD also have three or more of the following symptoms (one or more in children):  Restlessness.   Fatigue.  Difficulty concentrating.   Irritability.  Muscle tension.  Difficulty sleeping or unsatisfying sleep. DIAGNOSIS GAD is diagnosed through an assessment by your caregiver. Your caregiver will ask you questions aboutyour mood,physical symptoms, and events in your life. Your caregiver may ask you about your medical history and use of alcohol or drugs, including prescription medications. Your caregiver may also do a physical exam and blood tests. Certain medical conditions and the use of certain substances can cause symptoms similar to those associated with GAD. Your caregiver may refer you to a mental health specialist for further evaluation. TREATMENT The following therapies are usually used to treat GAD:   Medication Antidepressant medication usually is prescribed for long-term daily control. Antianxiety medications may be added in severe cases, especially when panic attacks occur.   Talk therapy (psychotherapy) Certain types of talk therapy can be helpful in treating GAD by providing support, education, and guidance. A form of talk therapy called cognitive behavioral therapy can teach you healthy ways to think about and react to daily life events and activities.  Stress managementtechniques These include yoga, meditation, and exercise and can be very helpful when they are practiced regularly. A mental health specialist can help determine which treatment is best for you. Some people see improvement with one therapy. However, other people require a combination of therapies. Document Released: 12/31/2012 Document Reviewed: 12/31/2012 [facility] Patient Information 2014 [place], [place].

## 2013-11-24 ENCOUNTER — Encounter: Payer: Self-pay | Admitting: Family Medicine

## 2013-11-24 MED ORDER — OLANZAPINE 2.5 MG PO TABS: 2.5000 mg | ORAL_TABLET | Freq: Every day | ORAL | Status: DC

## 2013-11-24 NOTE — Progress Notes (Signed)
Patient ID: Sara West, female   DOB: 10/17/1965, 48 y.o.   MRN: [id number] Sara West [id number] 1965-11-06 11/24/2013      Progress Note-Follow Up  Subjective  Chief Complaint  Chief Complaint  Patient presents with  . Follow-up    hospital    HPI  Patient is a 48 year old Caucasian female who is here today for ER followup. She presented to the ER [place] onset of shortness of breath and question chest pain radiating to her back she had left arm numbness she got tremulous and anxious. He workup is negative for any cardiac cause and in retrospect she feels anxiety could play a role. She's previously been told she has bipolar disorder but was unwilling to admit it. 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 She is now ready to consider medications. She feels the increase in citalopram helped her depression but made her anxiety and manic behavior worse. Presently she has no chest pain, palpitations or shortness of breath. She technologist she's had a poor appetite which is consistent with her previous episodes of feeling overwhelmed.  Past Medical History  Diagnosis Date  . ANXIETY STATE NEC 05/22/2007  . PSORIASIS 04/24/2007  . INSOMNIA-SLEEP DISORDER-UNSPEC 12/10/2009  . Plantar fasciitis, bilateral 03/05/2013  . Leg pain, bilateral 03/05/2013  . Bipolar disorder, unspecified 05/22/2007    Qualifier: Diagnosis of  By: Cato Mulligan MD, Bruce      Past Surgical History  Procedure Laterality Date  . Cholecystectomy    . Cesarean section      x2 requiring blood transfusion (BTL with 2nd one)  . Tonsilectomy, adenoidectomy, bilateral myringotomy and tubes      Family History  Problem Relation Age of Onset  . Alcohol abuse Mother     History   Social History  . Marital Status: Married    Spouse Name: N/A    Number of Children: N/A  . Years of Education: N/A   Occupational History  . free lance for [employer]     Social History Main Topics  . Smoking status: Former Smoker    Types: Cigarettes    Quit date: 11/19/2005  . Smokeless tobacco: Never Used  . Alcohol Use: Yes  . Drug Use: No  . Sexual Activity: Not on file   Other Topics Concern  . Not on file   Social History Narrative  . No narrative on file    Current Outpatient Prescriptions on File Prior to Visit  Medication Sig Dispense Refill  . citalopram (CELEXA) 20 MG tablet Take 1 tablet (20 mg total) by mouth daily.  30 tablet  1  . Multiple Vitamins-Minerals (WOMENS MULTIVITAMIN PLUS) TABS Take 1 each by mouth daily. ALIVE NATURAL MVI.      Marland Kitchen traMADol (ULTRAM) 50 MG tablet Take 50 mg by mouth 3 (three) times daily as needed for moderate pain.       No current facility-administered medications on file prior to visit.    No Known Allergies  Review of Systems  Review of Systems  Constitutional: Negative for fever and malaise/fatigue.  HENT: Negative for congestion.   Eyes: Negative for discharge.  Respiratory: Positive for shortness of breath.   Cardiovascular: Positive for chest pain and palpitations. Negative for leg swelling.  Gastrointestinal: Negative for nausea, abdominal pain and diarrhea.  Genitourinary: Negative for dysuria.  Musculoskeletal: Positive for back pain. Negative for falls.  Skin: Negative for rash.  Neurological: Negative for loss of consciousness  and headaches.  Endo/Heme/Allergies: Negative for polydipsia.  Psychiatric/Behavioral: Positive for depression. Negative for suicidal ideas. The patient is nervous/anxious and has insomnia.     Objective  BP 100/72  Pulse 69  Temp(Src) 98.2 F (36.8 C) (Oral)  Ht 5' 4.75" (1.645 m)  Wt 139 lb (63.05 kg)  BMI 23.30 kg/m2  SpO2 97%  LMP 11/19/2013  Physical Exam  Physical Exam  Constitutional: She is oriented to person, place, and time and well-developed, well-nourished, and in no distress. No distress.  HENT:  Head: Normocephalic and  atraumatic.  Eyes: Conjunctivae are normal.  Neck: Neck supple. No thyromegaly present.  Cardiovascular: Normal rate, regular rhythm and normal heart sounds.   No murmur heard. Pulmonary/Chest: Effort normal and breath sounds normal. She has no wheezes.  Abdominal: She exhibits no distension and no mass.  Musculoskeletal: She exhibits no edema.  Lymphadenopathy:    She has no cervical adenopathy.  Neurological: She is alert and oriented to person, place, and time.  Skin: Skin is warm and dry. No rash noted. She is not diaphoretic.  Psychiatric: Memory, affect and judgment normal.    Lab Results  Component Value Date   TSH 2.121 08/03/2011   Lab Results  Component Value Date   WBC 11.1* 11/19/2013   HGB 12.5 11/19/2013   HCT 35.7* 11/19/2013   MCV 87.7 11/19/2013   PLT 236 11/19/2013   Lab Results  Component Value Date   CREATININE 0.64 11/19/2013   BUN 11 11/19/2013   NA 141 11/19/2013   K 3.9 11/19/2013   CL 101 11/19/2013   CO2 24 11/19/2013   Lab Results  Component Value Date   ALT 15 06/03/2013   AST 17 06/03/2013   ALKPHOS 39 06/03/2013   BILITOT 0.2* 06/03/2013   Lab Results  Component Value Date   CHOL 131 03/05/2013   Lab Results  Component Value Date   HDL 55 03/05/2013   Lab Results  Component Value Date   LDLCALC 64 03/05/2013   Lab Results  Component Value Date   TRIG 61 03/05/2013   Lab Results  Component Value Date   CHOLHDL 2.4 03/05/2013     Assessment & Plan  Bipolar disorder, unspecified Is admitting this diagnosis fits her after her recent trip to ED for a panic attack. She feels the increased Citalopram helped her depression but she has felt more manic and anxious. Given Alprazolam to use prn. Tried to start Abilify but insurance declined to pay. So will try Zyprexa 2.5 mg daily

## 2013-11-24 NOTE — Assessment & Plan Note (Addendum)
Is admitting this diagnosis fits her after her recent trip to ED for a panic attack. She feels the increased Citalopram helped her depression but she has felt more manic and anxious. Given Alprazolam to use prn. Tried to start Abilify but insurance declined to pay. So will try Zyprexa 2.5 mg daily. Spent 25 minutes of a 30 minute visit in counseling

## 2013-11-26 ENCOUNTER — Encounter: Payer: Self-pay | Admitting: Family Medicine

## 2013-11-29 ENCOUNTER — Other Ambulatory Visit: Payer: Self-pay | Admitting: Family Medicine

## 2013-12-05 ENCOUNTER — Other Ambulatory Visit: Payer: Self-pay | Admitting: Family Medicine

## 2013-12-08 ENCOUNTER — Other Ambulatory Visit: Payer: Self-pay | Admitting: Family Medicine

## 2013-12-09 ENCOUNTER — Other Ambulatory Visit: Payer: Self-pay | Admitting: Family Medicine

## 2013-12-09 NOTE — Telephone Encounter (Signed)
RX sent for 30 day supply.   Per md make sure patient has an appt soon and she is taking her Zyprexa

## 2013-12-09 NOTE — Telephone Encounter (Signed)
Please advise refill? Last RX was done on 11-22-13 quantity 40 with 0 refills  If ok please fax to [phone]

## 2013-12-09 NOTE — Telephone Encounter (Signed)
Just make sure she is also taking her Zyprexa and that she has appt soon then she can have Alprazolam same strength, sig 1 tab po bid prn anxiety or insomnia. Disp #30, no rf til seen

## 2013-12-10 NOTE — Telephone Encounter (Signed)
Informed patient of medication refill and she scheduled appointment for late April. She does want a nurse to call her back. She has questions regarding medication.

## 2013-12-12 ENCOUNTER — Ambulatory Visit: Payer: Self-pay | Admitting: Family Medicine

## 2013-12-16 ENCOUNTER — Encounter: Payer: Self-pay | Admitting: Family Medicine

## 2013-12-16 ENCOUNTER — Other Ambulatory Visit: Payer: Self-pay | Admitting: Family Medicine

## 2013-12-16 MED ORDER — ALPRAZOLAM 0.25 MG PO TABS: 0.2500 mg | ORAL_TABLET | Freq: Two times a day (BID) | ORAL | Status: DC | PRN

## 2013-12-23 ENCOUNTER — Other Ambulatory Visit: Payer: Self-pay

## 2013-12-23 ENCOUNTER — Other Ambulatory Visit: Payer: Self-pay | Admitting: Family Medicine

## 2013-12-23 DIAGNOSIS — F411 Generalized anxiety disorder: Secondary | ICD-10-CM

## 2013-12-23 MED ORDER — ALPRAZOLAM 0.25 MG PO TABS: 0.2500 mg | ORAL_TABLET | Freq: Two times a day (BID) | ORAL | Status: DC | PRN

## 2014-01-08 ENCOUNTER — Other Ambulatory Visit: Payer: Self-pay | Admitting: Family Medicine

## 2014-01-12 ENCOUNTER — Other Ambulatory Visit: Payer: Self-pay | Admitting: Family Medicine

## 2014-01-13 ENCOUNTER — Ambulatory Visit (INDEPENDENT_AMBULATORY_CARE_PROVIDER_SITE_OTHER): Payer: Managed Care, Other (non HMO) | Admitting: Family Medicine

## 2014-01-13 ENCOUNTER — Encounter: Payer: Self-pay | Admitting: Family Medicine

## 2014-01-13 VITALS — BP 122/78 | HR 68 | Temp 98.1°F | Ht 64.75 in | Wt 148.1 lb

## 2014-01-13 DIAGNOSIS — F411 Generalized anxiety disorder: Secondary | ICD-10-CM

## 2014-01-13 DIAGNOSIS — G4700 Insomnia, unspecified: Secondary | ICD-10-CM

## 2014-01-13 DIAGNOSIS — F319 Bipolar disorder, unspecified: Secondary | ICD-10-CM

## 2014-01-13 DIAGNOSIS — R635 Abnormal weight gain: Secondary | ICD-10-CM

## 2014-01-13 MED ORDER — CITALOPRAM HYDROBROMIDE 20 MG PO TABS: 20.0000 mg | ORAL_TABLET | Freq: Every day | ORAL | Status: DC

## 2014-01-13 MED ORDER — ALPRAZOLAM 0.25 MG PO TABS: ORAL_TABLET | ORAL | Status: DC

## 2014-01-13 MED ORDER — OLANZAPINE 2.5 MG PO TABS: 2.5000 mg | ORAL_TABLET | Freq: Every day | ORAL | Status: DC

## 2014-01-13 NOTE — Progress Notes (Signed)
Pre visit review using our clinic review tool, if applicable. No additional management support is needed unless otherwise documented below in the visit note. 

## 2014-01-13 NOTE — Patient Instructions (Signed)
Probiotic such as Digestive Advantage daily Fiber, Benefiber or Metamucil twice a day  DASH Diet The DASH diet stands for "Dietary Approaches to Stop Hypertension." It is a healthy eating plan that has been shown to reduce high blood pressure (hypertension) in as little as 14 days, while also possibly providing other significant health benefits. These other health benefits include reducing the risk of breast cancer after menopause and reducing the risk of type 2 diabetes, heart disease, colon cancer, and stroke. Health benefits also include weight loss and slowing kidney failure in patients with chronic kidney disease.  DIET GUIDELINES  Limit salt (sodium). Your diet should contain less than 1500 mg of sodium daily.  Limit refined or processed carbohydrates. Your diet should include mostly whole grains. Desserts and added sugars should be used sparingly.  Include small amounts of heart-healthy fats. These types of fats include nuts, oils, and tub margarine. Limit saturated and trans fats. These fats have been shown to be harmful in the body. CHOOSING FOODS  The following food groups are based on a 2000 calorie diet. See your Registered Dietitian for individual calorie needs. Grains and Grain Products (6 to 8 servings daily)  Eat More Often: Whole-wheat bread, brown rice, whole-grain or wheat pasta, quinoa, popcorn without added fat or salt (air popped).  Eat Less Often: White bread, white pasta, white rice, cornbread. Vegetables (4 to 5 servings daily)  Eat More Often: Fresh, frozen, and canned vegetables. Vegetables may be raw, steamed, roasted, or grilled with a minimal amount of fat.  Eat Less Often/Avoid: Creamed or fried vegetables. Vegetables in a cheese sauce. Fruit (4 to 5 servings daily)  Eat More Often: All fresh, canned (in natural juice), or frozen fruits. Dried fruits without added sugar. One hundred percent fruit juice ( cup [237 mL] daily).  Eat Less Often: Dried fruits  with added sugar. Canned fruit in light or heavy syrup. Foot LockerLean Meats, Fish, and Poultry (2 servings or less daily. One serving is 3 to 4 oz [85-114 g]).  Eat More Often: Ninety percent or leaner ground beef, tenderloin, sirloin. Round cuts of beef, chicken breast, [place] breast. All fish. Grill, bake, or broil your meat. Nothing should be fried.  Eat Less Often/Avoid: Fatty cuts of meat, [place], or chicken leg, thigh, or wing. Fried cuts of meat or fish. Dairy (2 to 3 servings)  Eat More Often: Low-fat or fat-free milk, low-fat plain or light yogurt, reduced-fat or part-skim cheese.  Eat Less Often/Avoid: Milk (whole, 2%).Whole milk yogurt. Full-fat cheeses. Nuts, Seeds, and Legumes (4 to 5 servings per week)  Eat More Often: All without added salt.  Eat Less Often/Avoid: Salted nuts and seeds, canned beans with added salt. Fats and Sweets (limited)  Eat More Often: Vegetable oils, tub margarines without trans fats, sugar-free gelatin. Mayonnaise and salad dressings.  Eat Less Often/Avoid: Coconut oils, palm oils, butter, stick margarine, cream, half and half, cookies, candy, pie. FOR MORE INFORMATION The Dash Diet Eating Plan: www.dashdiet.org Document Released: 08/25/2011 Document Revised: 11/28/2011 Document Reviewed: 08/25/2011 [facility] Information 2014 [place].

## 2014-01-19 ENCOUNTER — Encounter: Payer: Self-pay | Admitting: Family Medicine

## 2014-01-19 DIAGNOSIS — R635 Abnormal weight gain: Secondary | ICD-10-CM

## 2014-01-19 HISTORY — DX: Abnormal weight gain: R63.5

## 2014-01-19 NOTE — Assessment & Plan Note (Signed)
Encouraged good sleep hygiene such as dark, quiet room. No blue/green glowing lights such as computer screens in bedroom. No alcohol or stimulants in evening. Cut down on caffeine as able. Regular exercise is helpful but not just prior to bed time. May use Alprazolam prn 

## 2014-01-19 NOTE — Assessment & Plan Note (Signed)
Encouraged DASH diet, decrease po intake and increase exercise as tolerated. Needs 7-8 hours of sleep nightly. Avoid trans fats, eat small, frequent meals every 4-5 hours with lean proteins, complex carbs and healthy fats. Minimize simple carbs, GMO foods. 

## 2014-01-19 NOTE — Assessment & Plan Note (Signed)
Doing very well on the combination of Celexa and Zyprexa will allow refills

## 2014-01-19 NOTE — Progress Notes (Signed)
Patient ID: Sara West, female   DOB: 12/12/1965, 48 y.o.   MRN: [id number] Sara West [id number] 12/12/1965 01/19/2014      Progress Note-Follow Up  Subjective  Chief Complaint  Chief Complaint  Patient presents with  . Medication Refill    HPI  Patient is a 48 year old female in today for routine medical care. Doing much better. Reports a combination of Zyprexa and Celexa is working well. She feels well. She's sleeping well. She's fascia was some mild weight gain but also notes she's working long hours of retroverted and eating badly. Denies any other concerns with medications as they are. She is much less irritable and much happier. Denies CP/palp/SOB/HA/congestion/fevers/GI or GU c/o. Taking meds as prescribed  Past Medical History  Diagnosis Date  . ANXIETY STATE NEC 05/22/2007  . PSORIASIS 04/24/2007  . INSOMNIA-SLEEP DISORDER-UNSPEC 12/10/2009  . Plantar fasciitis, bilateral 03/05/2013  . Leg pain, bilateral 03/05/2013  . Bipolar disorder, unspecified 05/22/2007    Qualifier: Diagnosis of  By: Cato MulliganSwords MD, Bruce    . Weight gain 01/19/2014    Past Surgical History  Procedure Laterality Date  . Cholecystectomy    . Cesarean section      x2 requiring blood transfusion (BTL with 2nd one)  . Tonsilectomy, adenoidectomy, bilateral myringotomy and tubes      Family History  Problem Relation Age of Onset  . Alcohol abuse Mother     History   Social History  . Marital Status: Married    Spouse Name: N/A    Number of Children: N/A  . Years of Education: N/A   Occupational History  . 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 Smoking status: Former Smoker    Types: Cigarettes    Quit date: 11/19/2005  . Smokeless tobacco: Never Used  . Alcohol Use: Yes  . Drug Use: No  . Sexual Activity: Not on file   Other Topics Concern  . Not on file   Social History Narrative  . No narrative on file    Current Outpatient Prescriptions on File  Prior to Visit  Medication Sig Dispense Refill  . Multiple Vitamins-Minerals (WOMENS MULTIVITAMIN PLUS) TABS Take 1 each by mouth daily. ALIVE NATURAL MVI.      Marland Kitchen. traMADol (ULTRAM) 50 MG tablet Take 50 mg by mouth 3 (three) times daily as needed for moderate pain.       No current facility-administered medications on file prior to visit.    No Known Allergies  Review of Systems  Review of Systems  Constitutional: Negative for fever and malaise/fatigue.  HENT: Negative for congestion.   Eyes: Negative for discharge.  Respiratory: Negative for shortness of breath.   Cardiovascular: Negative for chest pain, palpitations and leg swelling.  Gastrointestinal: Negative for nausea, abdominal pain and diarrhea.  Genitourinary: Negative for dysuria.  Musculoskeletal: Negative for falls.  Skin: Negative for rash.  Neurological: Negative for loss of consciousness and headaches.  Endo/Heme/Allergies: Negative for polydipsia.  Psychiatric/Behavioral: Negative for depression and suicidal ideas. The patient is nervous/anxious and has insomnia.     Objective  BP 122/78  Pulse 68  Temp(Src) 98.1 F (36.7 C) (Oral)  Ht 5' 4.75" (1.645 m)  Wt 148 lb 1.3 oz (67.169 kg)  BMI 24.82 kg/m2  SpO2 95%  LMP 01/06/2014  Physical Exam  Physical Exam  Constitutional: She is oriented to person, place, and time and well-developed, well-nourished, and in no distress. No distress.  HENT:  Head: Normocephalic and atraumatic.  Eyes: Conjunctivae are normal.  Neck: Neck supple. No thyromegaly present.  Cardiovascular: Normal rate, regular rhythm and normal heart sounds.   No murmur heard. Pulmonary/Chest: Effort normal and breath sounds normal. She has no wheezes.  Abdominal: She exhibits no distension and no mass.  Musculoskeletal: She exhibits no edema.  Lymphadenopathy:    She has no cervical adenopathy.  Neurological: She is alert and oriented to person, place, and time.  Skin: Skin is warm and  dry. No rash noted. She is not diaphoretic.  Psychiatric: Memory, affect and judgment normal.   Lab Results  Component Value Date   TSH 2.121 08/03/2011   Lab Results  Component Value Date   WBC 11.1* 11/19/2013   HGB 12.5 11/19/2013   HCT 35.7* 11/19/2013   MCV 87.7 11/19/2013   PLT 236 11/19/2013   Lab Results  Component Value Date   CREATININE 0.64 11/19/2013   BUN 11 11/19/2013   NA 141 11/19/2013   K 3.9 11/19/2013   CL 101 11/19/2013   CO2 24 11/19/2013   Lab Results  Component Value Date   ALT 15 06/03/2013   AST 17 06/03/2013   ALKPHOS 39 06/03/2013   BILITOT 0.2* 06/03/2013   Lab Results  Component Value Date   CHOL 131 03/05/2013   Lab Results  Component Value Date   HDL 55 03/05/2013   Lab Results  Component Value Date   LDLCALC 64 03/05/2013   Lab Results  Component Value Date   TRIG 61 03/05/2013   Lab Results  Component Value Date   CHOLHDL 2.4 03/05/2013     Assessment & Plan  INSOMNIA-SLEEP DISORDER-UNSPEC Encouraged good sleep hygiene such as dark, quiet room. No blue/green glowing lights such as computer screens in bedroom. No alcohol or stimulants in evening. Cut down on caffeine as able. Regular exercise is helpful but not just prior to bed time. Mayuse Alprazolam prn  Weight gain Encouraged DASH diet, decrease po intake and increase exercise as tolerated. Needs 7-8 hours of sleep nightly. Avoid trans fats, eat small, frequent meals every 4-5 hours with lean proteins, complex carbs and healthy fats. Minimize simple carbs, GMO foods.  Bipolar disorder, unspecified Doing very well on the combination of Celexa and Zyprexa will allow refills

## 2014-02-13 ENCOUNTER — Telehealth: Payer: Self-pay | Admitting: Family Medicine

## 2014-02-13 NOTE — Telephone Encounter (Signed)
[facility]-tramadol  [facility] [phone]

## 2014-02-13 NOTE — Telephone Encounter (Signed)
Need to know what she needs it for and how many she gets. Tramadol 50 mg po tid prn pain, disp #40

## 2014-02-13 NOTE — Telephone Encounter (Signed)
Please advise refill? It looks like we just have this in as a historical med

## 2014-02-14 MED ORDER — TRAMADOL HCL 50 MG PO TABS: 50.0000 mg | ORAL_TABLET | Freq: Three times a day (TID) | ORAL | Status: DC | PRN

## 2014-02-14 NOTE — Telephone Encounter (Signed)
Left a message for patient to return my call. 

## 2014-02-14 NOTE — Telephone Encounter (Signed)
Pt called back and states that she takes this for her planter fascitis.  Pt stated 40 is ok she usually takes 1 a day prn

## 2014-03-08 ENCOUNTER — Other Ambulatory Visit: Payer: Self-pay | Admitting: Family Medicine

## 2014-03-10 ENCOUNTER — Encounter: Payer: Self-pay | Admitting: Family Medicine

## 2014-03-10 NOTE — Telephone Encounter (Signed)
Medication Detail      Disp Refills Start End     citalopram (CELEXA) 20 MG tablet 30 tablet 3 01/13/2014     Sig - Route: Take 1 tablet (20 mg total) by mouth daily. - Oral    E-Prescribing Status: Receipt confirmed by pharmacy (01/13/2014 5:04 PM EDT)    Rx request Denied--Too Soon for request/SLS

## 2014-03-11 ENCOUNTER — Telehealth: Payer: Self-pay | Admitting: Family Medicine

## 2014-03-11 NOTE — Telephone Encounter (Signed)
Received paperwork for PA on Olanzapine 2.5mg , forward paperwork to nurse

## 2014-03-12 MED ORDER — ARIPIPRAZOLE 5 MG PO TABS: 5.0000 mg | ORAL_TABLET | Freq: Every day | ORAL | Status: DC

## 2014-03-12 NOTE — Telephone Encounter (Signed)
Per PCP, patient sent message stating that she would like to switch to Abilify; sent in Abilify 5 mg, #30x3; LMOM with contact name and number [for return call, if needed] RE: change in medication per provider instructions/SLS

## 2014-03-17 ENCOUNTER — Encounter: Payer: Self-pay | Admitting: Family Medicine

## 2014-03-17 NOTE — Telephone Encounter (Signed)
[facility] and pt was going to restart Zyprexa. Revonda Standardllison states she still has some RX's on file for her and will fill them for patient.

## 2014-03-24 ENCOUNTER — Encounter: Payer: Self-pay | Admitting: Family Medicine

## 2014-03-24 ENCOUNTER — Ambulatory Visit (INDEPENDENT_AMBULATORY_CARE_PROVIDER_SITE_OTHER): Payer: 59 | Admitting: Family Medicine

## 2014-03-24 VITALS — BP 122/74 | HR 72 | Temp 98.0°F | Ht 64.75 in | Wt 150.1 lb

## 2014-03-24 DIAGNOSIS — M79604 Pain in right leg: Secondary | ICD-10-CM

## 2014-03-24 DIAGNOSIS — F319 Bipolar disorder, unspecified: Secondary | ICD-10-CM

## 2014-03-24 DIAGNOSIS — M722 Plantar fascial fibromatosis: Secondary | ICD-10-CM

## 2014-03-24 DIAGNOSIS — M79605 Pain in left leg: Secondary | ICD-10-CM

## 2014-03-24 DIAGNOSIS — M79609 Pain in unspecified limb: Secondary | ICD-10-CM

## 2014-03-24 NOTE — Patient Instructions (Signed)
Hyland's leg cramp meds Hydrate well, regular exercise Consider Salon Pas patches Call if worse so we can refer to sports medicine for further consideration  Arthritis, Nonspecific Arthritis is inflammation of a joint. This usually means pain, redness, warmth or swelling are present. One or more joints may be involved. There are a number of types of arthritis. Your caregiver may not be able to tell what type of arthritis you have right away. CAUSES  The most common cause of arthritis is the wear and tear on the joint (osteoarthritis). This causes damage to the cartilage, which can break down over time. The knees, hips, back and neck are most often affected by this type of arthritis. Other types of arthritis and common causes of joint pain include:  Sprains and other injuries near the joint. Sometimes minor sprains and injuries cause pain and swelling that develop hours later.  Rheumatoid arthritis. This affects hands, feet and knees. It usually affects both sides of your body at the same time. It is often associated with chronic ailments, fever, weight loss and general weakness.  Crystal arthritis. Gout and pseudo gout can cause occasional acute severe pain, redness and swelling in the foot, ankle, or knee.  Infectious arthritis. Bacteria can get into a joint through a break in overlying skin. This can cause infection of the joint. Bacteria and viruses can also spread through the blood and affect your joints.  Drug, infectious and allergy reactions. Sometimes joints can become mildly painful and slightly swollen with these types of illnesses. SYMPTOMS   Pain is the main symptom.  Your joint or joints can also be red, swollen and warm or hot to the touch.  You may have a fever with certain types of arthritis, or even feel overall ill.  The joint with arthritis will hurt with movement. Stiffness is present with some types of arthritis. DIAGNOSIS  Your caregiver will suspect arthritis  based on your description of your symptoms and on your exam. Testing may be needed to find the type of arthritis:  Blood and sometimes urine tests.  X-ray tests and sometimes CT or MRI scans.  Removal of fluid from the joint (arthrocentesis) is done to check for bacteria, crystals or other causes. Your caregiver (or a specialist) will numb the area over the joint with a local anesthetic, and use a needle to remove joint fluid for examination. This procedure is only minimally uncomfortable.  Even with these tests, your caregiver may not be able to tell what kind of arthritis you have. Consultation with a specialist (rheumatologist) may be helpful. TREATMENT  Your caregiver will discuss with you treatment specific to your type of arthritis. If the specific type cannot be determined, then the following general recommendations may apply. Treatment of severe joint pain includes:  Rest.  Elevation.  Anti-inflammatory medication (for example, ibuprofen) may be prescribed. Avoiding activities that cause increased pain.  Only take over-the-counter or prescription medicines for pain and discomfort as recommended by your caregiver.  Cold packs over an inflamed joint may be used for 10 to 15 minutes every hour. Hot packs sometimes feel better, but do not use overnight. Do not use hot packs if you are diabetic without your caregiver's permission.  A cortisone shot into arthritic joints may help reduce pain and swelling.  Any acute arthritis that gets worse over the next 1 to 2 days needs to be looked at to be sure there is no joint infection. Long-term arthritis treatment involves modifying activities and lifestyle to  reduce joint stress jarring. This can include weight loss. Also, exercise is needed to nourish the joint cartilage and remove waste. This helps keep the muscles around the joint strong. HOME CARE INSTRUCTIONS   Do not take aspirin to relieve pain if gout is suspected. This elevates uric  acid levels.  Only take over-the-counter or prescription medicines for pain, discomfort or fever as directed by your caregiver.  Rest the joint as much as possible.  If your joint is swollen, keep it elevated.  Use crutches if the painful joint is in your leg.  Drinking plenty of fluids may help for certain types of arthritis.  Follow your caregiver's dietary instructions.  Try low-impact exercise such as:  Swimming.  Water aerobics.  Biking.  Walking.  Morning stiffness is often relieved by a warm shower.  Put your joints through regular range-of-motion. SEEK MEDICAL CARE IF:   You do not feel better in 24 hours or are getting worse.  You have side effects to medications, or are not getting better with treatment. SEEK IMMEDIATE MEDICAL CARE IF:   You have a fever.  You develop severe joint pain, swelling or redness.  Many joints are involved and become painful and swollen.  There is severe back pain and/or leg weakness.  You have loss of bowel or bladder control. Document Released: 10/13/2004 Document Revised: 11/28/2011 Document Reviewed: 10/29/2008 [facility] Patient Information 2015 [employer] [place]. This information is not intended to replace advice given to you by your health care provider. Make sure you discuss any questions you have with your health care provider.

## 2014-03-24 NOTE — Assessment & Plan Note (Signed)
Took a while to recover but finally did improve.

## 2014-03-24 NOTE — Progress Notes (Signed)
Patient ID: Sara JourneyMaura A Ostlund, female   DOB: 08/29/1966, 48 y.o.   MRN: [id number] Sara JourneyMaura A Levings [id number] 08/29/1966 03/24/2014      Progress Note-Follow Up  Subjective  Chief Complaint  Chief Complaint  Patient presents with  . Follow-up    medication  . Leg Pain    HPI  Patient is a 48 year old female in today for routine medical care. Patient is struggling with intermittent b/l leg pain. Describes it as aching and overwhelming at times. No injury or falls. No back pain or abdominal pain. No GI or GU concerns. She does note the pain appears to be social with menstrual cycle the last few months. This is due shortly. No numbness tingling or weakness. She is happy with her response to Zyprexa. She just Abilify caused headaches in strange disassociative feeling but Zyprexa is working well to stabilize her moods.   Past Medical History  Diagnosis Date  . ANXIETY STATE NEC 05/22/2007  . PSORIASIS 04/24/2007  . INSOMNIA-SLEEP DISORDER-UNSPEC 12/10/2009  . Plantar fasciitis, bilateral 03/05/2013  . Leg pain, bilateral 03/05/2013  . Bipolar disorder, unspecified 05/22/2007    Qualifier: Diagnosis of  By: Cato MulliganSwords MD, Bruce    . Weight gain 01/19/2014    Past Surgical History  Procedure Laterality Date  . Cholecystectomy    . Cesarean section      x2 requiring blood transfusion (BTL with 2nd one)  . Tonsilectomy, adenoidectomy, bilateral myringotomy and tubes      Family History  Problem Relation Age of Onset  . Alcohol abuse Mother     History   Social History  . Marital Status: Married    Spouse Name: N/A    Number of Children: N/A  . Years of Education: N/A   Occupational History  . 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 Smoking status: Former Smoker    Types: Cigarettes    Quit date: 11/19/2005  . Smokeless tobacco: Never Used  . Alcohol Use: Yes  . Drug Use: No  . Sexual Activity: Not on file   Other Topics Concern  . Not on file   Social  History Narrative  . No narrative on file    Current Outpatient Prescriptions on File Prior to Visit  Medication Sig Dispense Refill  . ALPRAZolam (XANAX) 0.25 MG tablet 1/2 to 2 tab po bid prn anxiety/insomnia  90 tablet  3  . citalopram (CELEXA) 20 MG tablet Take 1 tablet (20 mg total) by mouth daily.  30 tablet  3  . Multiple Vitamins-Minerals (WOMENS MULTIVITAMIN PLUS) TABS Take 1 each by mouth daily. ALIVE NATURAL MVI.      Marland Kitchen. OLANZapine (ZYPREXA) 2.5 MG tablet Take 1 tablet (2.5 mg total) by mouth at bedtime.  30 tablet  3  . traMADol (ULTRAM) 50 MG tablet Take 1 tablet (50 mg total) by mouth 3 (three) times daily as needed for moderate pain.  40 tablet  0   No current facility-administered medications on file prior to visit.    No Known Allergies  Review of Systems  Review of Systems  Constitutional: Negative for fever and malaise/fatigue.  HENT: Negative for congestion.   Eyes: Negative for discharge.  Respiratory: Negative for shortness of breath.   Cardiovascular: Negative for chest pain, palpitations and leg swelling.  Gastrointestinal: Negative for nausea, abdominal pain and diarrhea.  Genitourinary: Negative for dysuria.  Musculoskeletal: Negative for falls.  Skin: Negative for rash.  Neurological: Negative  for loss of consciousness and headaches.  Endo/Heme/Allergies: Negative for polydipsia.  Psychiatric/Behavioral: Negative for depression and suicidal ideas. The patient is not nervous/anxious and does not have insomnia.     Objective  BP 122/74  Pulse 72  Temp(Src) 98 F (36.7 C) (Oral)  Ht 5' 4.75" (1.645 m)  Wt 150 lb 1.9 oz (68.094 kg)  BMI 25.16 kg/m2  SpO2 97%  LMP 03/03/2014  Physical Exam  Physical Exam  Vitals reviewed. Constitutional: She is oriented to person, place, and time and well-developed, well-nourished, and in no distress. No distress.  HENT:  Head: Normocephalic and atraumatic.  Eyes: Conjunctivae are normal.  Neck: Neck supple.  No thyromegaly present.  Cardiovascular: Normal rate, regular rhythm and normal heart sounds.   No murmur heard. Pulmonary/Chest: Effort normal and breath sounds normal. She has no wheezes.  Abdominal: She exhibits no distension and no mass.  Musculoskeletal: She exhibits no edema.  Lymphadenopathy:    She has no cervical adenopathy.  Neurological: She is alert and oriented to person, place, and time.  Skin: Skin is warm and dry. No rash noted. She is not diaphoretic.  Psychiatric: Memory, affect and judgment normal.    Lab Results  Component Value Date   TSH 2.121 08/03/2011   Lab Results  Component Value Date   WBC 11.1* 11/19/2013   HGB 12.5 11/19/2013   HCT 35.7* 11/19/2013   MCV 87.7 11/19/2013   PLT 236 11/19/2013   Lab Results  Component Value Date   CREATININE 0.64 11/19/2013   BUN 11 11/19/2013   NA 141 11/19/2013   K 3.9 11/19/2013   CL 101 11/19/2013   CO2 24 11/19/2013   Lab Results  Component Value Date   ALT 15 06/03/2013   AST 17 06/03/2013   ALKPHOS 39 06/03/2013   BILITOT 0.2* 06/03/2013   Lab Results  Component Value Date   CHOL 131 03/05/2013   Lab Results  Component Value Date   HDL 55 03/05/2013   Lab Results  Component Value Date   LDLCALC 64 03/05/2013   Lab Results  Component Value Date   TRIG 61 03/05/2013   Lab Results  Component Value Date   CHOLHDL 2.4 03/05/2013     Assessment & Plan  Plantar fasciitis, bilateral Took a while to recover but finally did improve.  Leg pain, bilateral Has been struggling with achy legs intermittently for several years now. Some days are good and some are bad, no help with Advil. Exercise seems to help. 1/2 of a Vicodin is helpful. Started to worsen when weight increased with bipolar meds. Worse with menstrual cycles. Try Advil 400 mg twice a day the day prior cycle and consider daily Curcumin   Bipolar disorder, unspecified Did not tolerate Abilify but has done well with Zyprexa. Frustrated with weight gain however.  Continue Zyprexa

## 2014-03-24 NOTE — Assessment & Plan Note (Signed)
Did not tolerate Abilify but has done well with Zyprexa. Frustrated with weight gain however. Continue Zyprexa

## 2014-03-24 NOTE — Assessment & Plan Note (Signed)
Has been struggling with achy legs intermittently for several years now. Some days are good and some are bad, no help with Advil. Exercise seems to help. 1/2 of a Vicodin is helpful. Started to worsen when weight increased with bipolar meds. Worse with menstrual cycles. Try Advil 400 mg twice a day the day prior cycle and consider daily Curcumin

## 2014-03-24 NOTE — Progress Notes (Signed)
Pre visit review using our clinic review tool, if applicable. No additional management support is needed unless otherwise documented below in the visit note. 

## 2014-03-26 ENCOUNTER — Telehealth: Payer: Self-pay | Admitting: Family Medicine

## 2014-03-26 MED ORDER — TRAMADOL HCL 50 MG PO TABS: 50.0000 mg | ORAL_TABLET | Freq: Three times a day (TID) | ORAL | Status: DC | PRN

## 2014-03-26 NOTE — Telephone Encounter (Signed)
Rx printed and forwarded to PRovider for signature. 

## 2014-03-26 NOTE — Telephone Encounter (Signed)
[facility]adol  [facility] [address]

## 2014-03-28 ENCOUNTER — Other Ambulatory Visit (HOSPITAL_COMMUNITY): Payer: Self-pay | Admitting: Obstetrics

## 2014-03-28 DIAGNOSIS — Z1231 Encounter for screening mammogram for malignant neoplasm of breast: Secondary | ICD-10-CM

## 2014-04-09 ENCOUNTER — Telehealth: Payer: Self-pay | Admitting: *Deleted

## 2014-04-09 NOTE — Telephone Encounter (Signed)
Received fax from [facility] stating that Olanzapine 2.5 mg had been rejected; not on patient's current medication list.? Patient states that the Abilify was not working and she wants to go back on the Olanzapine so she "had her husband go to the pharmacy and have them run the medication in their system to produce a rejection note"; explained to patient that this change would have to be approved by PCP and then an Rx sent to the pharmacy before we could proceed with a prior authorization/SLS There is double documentation on this matter: Medication Notes Sara West, RMA -- 03/17/14 8:34 AM didn't help- wants to go back to Zyprexa  Medication was changed in system.

## 2014-04-18 ENCOUNTER — Telehealth: Payer: Self-pay

## 2014-04-18 DIAGNOSIS — F319 Bipolar disorder, unspecified: Secondary | ICD-10-CM

## 2014-04-18 MED ORDER — OLANZAPINE 2.5 MG PO TABS: 2.5000 mg | ORAL_TABLET | Freq: Every day | ORAL | Status: DC

## 2014-04-18 NOTE — Telephone Encounter (Signed)
Pt called about Zyprexa medication. Pt states the pharmacy informed her Rx requires a PA. Contacted pharmacy and they stated to me the notification they got stated pt's insurance plan will only cover 14 pills per year. [facility], representative stated insurance plan would cover 14 pills for the initial prescription. Requested PA from for Zyprexa to be faxed to office. Would it be ok to fill rx for a 14 day supply until PA could be submitted for quantity exception?  Please advise, Thanks!

## 2014-04-18 NOTE — Telephone Encounter (Signed)
Pt advised that rx has been sent and that PA will be started on Monday.

## 2014-04-18 NOTE — Telephone Encounter (Signed)
Yes OK to refill for 14 days and proceed with PA

## 2014-04-21 ENCOUNTER — Telehealth: Payer: Self-pay | Admitting: *Deleted

## 2014-04-21 NOTE — Telephone Encounter (Signed)
Eulis ManlyMegan E Tuck, LPN at 1/61/09607/31/2015  5:03 PM      Status: Signed            Pt advised that rx has been sent and that PA will be started on Monday.           Bradd CanaryStacey A Blyth, MD at 04/18/2014  4:57 PM      Status: Signed            Yes OK to refill for 14 days and proceed with PA         Eulis ManlyMegan E Tuck, LPN at 4/54/09817/31/2015  3:00 PM      Status: Signed            Pt called about Zyprexa medication. Pt states the pharmacy informed her Rx requires a PA. Contacted pharmacy and they stated to me the notification they got stated pt's insurance plan will only cover 14 pills per year. [facility], representative stated insurance plan would cover 14 pills for the initial prescription. Requested PA from for Zyprexa to be faxed to office. Would it be ok to fill rx for a 14 day supply until PA could be submitted for quantity exception?   Please advise, Thanks!

## 2014-04-22 ENCOUNTER — Ambulatory Visit: Payer: Managed Care, Other (non HMO) | Admitting: Family Medicine

## 2014-04-22 NOTE — Telephone Encounter (Signed)
Patient left a message stating that she would like a call back with an update on this PA?  Do you know about this Nicki Guadalajararicia?

## 2014-04-22 NOTE — Telephone Encounter (Signed)
Can you get a pa started on this please?

## 2014-04-22 NOTE — Telephone Encounter (Signed)
No I have not received paperwork on this

## 2014-04-22 NOTE — Telephone Encounter (Signed)
Have you seen this paperwork?

## 2014-04-22 NOTE — Telephone Encounter (Signed)
No, pt left message regarding this and I saw the note that Aundra MilletMegan had taken that looks like pharmacy was going to fax us the paperwork?

## 2014-04-23 ENCOUNTER — Ambulatory Visit (HOSPITAL_COMMUNITY)
Admission: RE | Admit: 2014-04-23 | Discharge: 2014-04-23 | Disposition: A | Discharge status: Home / Self Care | Payer: 59 | Source: Ambulatory Visit | Attending: Obstetrics | Admitting: Obstetrics

## 2014-04-23 DIAGNOSIS — Z1231 Encounter for screening mammogram for malignant neoplasm of breast: Secondary | ICD-10-CM | POA: Insufficient documentation

## 2014-04-28 NOTE — Telephone Encounter (Signed)
Forward PA paperwork to nurse

## 2014-04-29 ENCOUNTER — Encounter: Payer: Self-pay | Admitting: Family Medicine

## 2014-04-30 NOTE — Telephone Encounter (Signed)
Faxed pa

## 2014-05-01 NOTE — Telephone Encounter (Signed)
Pt pa for Saddie BendersOlazapine was denied due to plan allows them to receive 14 pills for one year

## 2014-05-02 NOTE — Telephone Encounter (Signed)
Can we appeal based on fact that she failed Abilify, Xanax, and Celexa?

## 2014-05-02 NOTE — Telephone Encounter (Signed)
Regardless insurance will only cover 14 pills a year. That is insurance guidelines.

## 2014-05-05 ENCOUNTER — Other Ambulatory Visit: Payer: Self-pay | Admitting: Family Medicine

## 2014-05-05 NOTE — Telephone Encounter (Signed)
I do not know what the dx was used on the PA, I just send form back to nurse and MD. Do you still have PA that was originally faxed? Please advise

## 2014-05-05 NOTE — Telephone Encounter (Signed)
fyi

## 2014-05-05 NOTE — Telephone Encounter (Signed)
PA resent with Bipolar and Diagnosis code

## 2014-05-05 NOTE — Telephone Encounter (Signed)
So I read the paperwork and it seems to say they will cover it for bipolar and depression, what was it submitted for? Maybe we need to resubmit for a Bipolar? What do they say the diagnosis is they are denying it for. I have the paperwork on my desk if we need to review together. Something does not make sense.

## 2014-05-06 ENCOUNTER — Telehealth: Payer: Self-pay | Admitting: Family Medicine

## 2014-05-06 NOTE — Telephone Encounter (Signed)
Molli HazardMatthew from [employer] is calling back in regards to a fax he received yesterday in regards to this patients medication. Per message he states the patient will have to initial the request with them and not he office. He can be reached at [phone]

## 2014-05-12 ENCOUNTER — Encounter: Payer: Self-pay | Admitting: Family Medicine

## 2014-05-12 DIAGNOSIS — F319 Bipolar disorder, unspecified: Secondary | ICD-10-CM

## 2014-05-13 NOTE — Telephone Encounter (Signed)
Information given to patient.   

## 2014-05-15 MED ORDER — OLANZAPINE 2.5 MG PO TABS: 2.5000 mg | ORAL_TABLET | Freq: Every day | ORAL | Status: DC

## 2014-05-15 NOTE — Addendum Note (Signed)
Addended by: Court Joy on: 05/15/2014 09:57 AM   Modules accepted: Orders

## 2014-05-31 ENCOUNTER — Other Ambulatory Visit: Payer: Self-pay | Admitting: Family Medicine

## 2014-06-02 ENCOUNTER — Telehealth: Payer: Self-pay | Admitting: Family Medicine

## 2014-06-02 NOTE — Telephone Encounter (Signed)
Received PA for Olanzapine, forward to nurse

## 2014-06-03 NOTE — Telephone Encounter (Signed)
Pa faxed

## 2014-06-05 NOTE — Telephone Encounter (Signed)
We have to appeal but paperwork I saw did not tell me what we need to do

## 2014-06-05 NOTE — Telephone Encounter (Signed)
PA was denied on 04-30-14 for [facility] on md's desk

## 2014-06-09 NOTE — Telephone Encounter (Signed)
Resent paperwork for appeal

## 2014-06-19 ENCOUNTER — Other Ambulatory Visit: Payer: Self-pay | Admitting: Family Medicine

## 2014-06-19 NOTE — Telephone Encounter (Signed)
rx printed for MD to sign and fax

## 2014-06-27 ENCOUNTER — Other Ambulatory Visit: Payer: Self-pay

## 2014-06-27 DIAGNOSIS — F411 Generalized anxiety disorder: Secondary | ICD-10-CM

## 2014-06-27 MED ORDER — ALPRAZOLAM 0.25 MG PO TABS: ORAL_TABLET | ORAL | Status: DC

## 2014-06-27 NOTE — Telephone Encounter (Signed)
Last RX was done on 01-13-14 quantity 90 with 3 refills  Pts last OV was 03-24-14  rx printed for md to sign and fax

## 2014-07-13 ENCOUNTER — Other Ambulatory Visit: Payer: Self-pay | Admitting: Family Medicine

## 2014-08-05 ENCOUNTER — Other Ambulatory Visit: Payer: Self-pay | Admitting: Family Medicine

## 2014-08-19 ENCOUNTER — Encounter: Payer: Self-pay | Admitting: Family Medicine

## 2014-08-20 ENCOUNTER — Other Ambulatory Visit: Payer: Self-pay

## 2014-08-20 MED ORDER — HYDROCODONE-ACETAMINOPHEN 5-325 MG PO TABS: ORAL_TABLET | ORAL | Status: DC

## 2014-08-20 NOTE — Progress Notes (Signed)
Rx printed per PCP notations. Placed on Dr B desk for signature.

## 2014-08-21 ENCOUNTER — Other Ambulatory Visit: Payer: Self-pay

## 2014-08-21 ENCOUNTER — Telehealth: Payer: Self-pay

## 2014-08-21 MED ORDER — HYDROCODONE-ACETAMINOPHEN 5-325 MG PO TABS: ORAL_TABLET | ORAL | Status: DC

## 2014-08-21 NOTE — Telephone Encounter (Signed)
Shredded the [employer] RX that [employer] had printed

## 2014-09-18 ENCOUNTER — Other Ambulatory Visit: Payer: Self-pay | Admitting: Family Medicine

## 2014-09-22 ENCOUNTER — Encounter: Payer: Self-pay | Admitting: Family Medicine

## 2014-09-22 NOTE — Telephone Encounter (Signed)
Zyprexa refilled 

## 2014-09-23 NOTE — Telephone Encounter (Signed)
Med sent to Amado CoeJessica Glover for notice.

## 2014-09-24 ENCOUNTER — Other Ambulatory Visit: Payer: Self-pay | Admitting: *Deleted

## 2014-09-24 MED ORDER — OLANZAPINE 2.5 MG PO TABS: ORAL_TABLET | ORAL | Status: DC

## 2014-09-24 NOTE — Telephone Encounter (Signed)
Prior authorization resubmitted. 

## 2014-09-24 NOTE — Telephone Encounter (Signed)
PA approved effective through 06/26/2015. Refill will be sent to patient's pharmacy. JG//CMA

## 2014-09-26 ENCOUNTER — Telehealth: Payer: Self-pay | Admitting: *Deleted

## 2014-09-26 NOTE — Telephone Encounter (Signed)
Requesting Tramadol HCL 50mg -Take 1 tablet by mouth 3 times daily as needed for moderate pain. Last refill:08-05-14;#40,0 Last OV:03-24-14 No UDS Please advise.//AB/CMA

## 2014-09-26 NOTE — Telephone Encounter (Signed)
She is at the 6 month mark she needs a uds and appt, she can have one last refill until seen

## 2014-09-29 ENCOUNTER — Telehealth: Payer: Self-pay | Admitting: *Deleted

## 2014-09-29 MED ORDER — TRAMADOL HCL 50 MG PO TABS: ORAL_TABLET | ORAL | Status: DC

## 2014-09-29 NOTE — Telephone Encounter (Signed)
Requesting Tramadol HCL 50mg-Take 1 tablet by mouth 3 times daily as needed for moderate pain. Last refill:08-05-14;#40,0 Last OV:03-24-14 No UDS Please advise.//AB/CMA 

## 2014-09-29 NOTE — Telephone Encounter (Signed)
It has been 6 months since I saw her, she can have 1 rf til seen but needs appt this month, has to do a UDS before she can have rx. Same strength, same sig, #30

## 2014-09-29 NOTE — Telephone Encounter (Signed)
Called and spoke with the pt and informed her of the note below.  Pt verbalized understanding and stated that she's leaving to go out of town today,but she will make an appt.  Pt stated that she needs to talk to Dr. Abner GreenspanBlyth regarding coming off some of her meds.  Pt stated that she will not pick up rx today but she will get it.  Informed the pt the rx will be placed up front.//AB/CMA

## 2014-09-29 NOTE — Telephone Encounter (Signed)
Called and spoke with the pt and informed her of the note below.  Pt verbalized understanding and stated that she's leaving to go out of town today,but she will make an appt.  Pt stated that she needs to talk to Dr. Blyth regarding coming off some of her meds.  Pt stated that she will not pick up rx today but she will get it.  Informed the pt the rx will be placed up front.//AB/CMA 

## 2014-10-07 ENCOUNTER — Telehealth: Payer: Self-pay | Admitting: Family Medicine

## 2014-10-07 ENCOUNTER — Other Ambulatory Visit: Payer: Self-pay | Admitting: Family Medicine

## 2014-10-07 NOTE — Telephone Encounter (Signed)
Caller name: Gordy CouncilmanRiley, Lindamarie A Relation to pt: self  Call back number: [phone] Pharmacy:  [id number] [place], [place] - [address] [phone] (Phone)     Reason for call:  Pt mother passed Saturday and patient is going out of town tomorrow requesting a RX for ALPRAZolam Prudy Feeler(XANAX) 0.25 MG tablet

## 2014-10-08 NOTE — Telephone Encounter (Signed)
Patient requesting Xanax (takes 1/2-2 tab BID PRN) Last refill 06/27/14 for 90 and 1.  Last office visit 03/24/14.   No UDS.  Please advise.   eal

## 2014-10-08 NOTE — Telephone Encounter (Signed)
Refill request: Xanx 0.25 mg Last refill: 06/27/2014 #90, 1 refill Last OV: 03/24/2014  Please advise

## 2014-10-09 NOTE — Telephone Encounter (Signed)
She can have one refill without more til seen. Let her know we need a visit every 6 months to refill this in the future. If no UDS or contract she will need this by next time at least. THx

## 2014-10-09 NOTE — Telephone Encounter (Signed)
Rx was sent to the pharmacy.//AB/CMA

## 2014-10-23 ENCOUNTER — Ambulatory Visit (INDEPENDENT_AMBULATORY_CARE_PROVIDER_SITE_OTHER): Payer: 59 | Admitting: Family Medicine

## 2014-10-23 ENCOUNTER — Encounter: Payer: Self-pay | Admitting: Family Medicine

## 2014-10-23 VITALS — BP 118/80 | HR 73 | Temp 98.3°F | Ht 64.75 in | Wt 156.6 lb

## 2014-10-23 DIAGNOSIS — E782 Mixed hyperlipidemia: Secondary | ICD-10-CM

## 2014-10-23 DIAGNOSIS — F319 Bipolar disorder, unspecified: Secondary | ICD-10-CM

## 2014-10-23 DIAGNOSIS — D72829 Elevated white blood cell count, unspecified: Secondary | ICD-10-CM

## 2014-10-23 DIAGNOSIS — R1013 Epigastric pain: Secondary | ICD-10-CM

## 2014-10-23 DIAGNOSIS — M79604 Pain in right leg: Secondary | ICD-10-CM

## 2014-10-23 DIAGNOSIS — M79605 Pain in left leg: Secondary | ICD-10-CM

## 2014-10-23 DIAGNOSIS — R109 Unspecified abdominal pain: Secondary | ICD-10-CM

## 2014-10-23 LAB — COMPREHENSIVE METABOLIC PANEL
ALT: 25 U/L (ref 0–35)
AST: 23 U/L (ref 0–37)
Albumin: 4.5 g/dL (ref 3.5–5.2)
Alkaline Phosphatase: 43 U/L (ref 39–117)
BUN: 14 mg/dL (ref 6–23)
CO2: 28 mEq/L (ref 19–32)
Calcium: 9.6 mg/dL (ref 8.4–10.5)
Chloride: 102 mEq/L (ref 96–112)
Creatinine, Ser: 0.79 mg/dL (ref 0.40–1.20)
GFR: 82.39 mL/min (ref >60.00)
Glucose, Bld: 103 mg/dL — ABNORMAL HIGH (ref 70–99)
Potassium: 4.1 mEq/L (ref 3.5–5.1)
Sodium: 136 mEq/L (ref 135–145)
Total Bilirubin: 0.3 mg/dL (ref 0.2–1.2)
Total Protein: 7.5 g/dL (ref 6.0–8.3)

## 2014-10-23 LAB — CBC
HCT: 38.1 % (ref 36.0–46.0)
Hemoglobin: 13 g/dL (ref 12.0–15.0)
MCHC: 34.2 g/dL (ref 30.0–36.0)
MCV: 89 fl (ref 78.0–100.0)
Platelets: 257 10*3/uL (ref 150.0–400.0)
RBC: 4.28 Mil/uL (ref 3.87–5.11)
RDW: 12.5 % (ref 11.5–15.5)
WBC: 6.9 10*3/uL (ref 4.0–10.5)

## 2014-10-23 LAB — LIPID PANEL
Cholesterol: 164 mg/dL (ref 0–200)
HDL: 53.9 mg/dL (ref >39.00)
LDL Cholesterol: 91 mg/dL (ref 0–99)
NonHDL: 110.1
Total CHOL/HDL Ratio: 3
Triglycerides: 97 mg/dL (ref 0.0–149.0)
VLDL: 19.4 mg/dL (ref 0.0–40.0)

## 2014-10-23 LAB — TSH: TSH: 2.14 u[IU]/mL (ref 0.35–4.50)

## 2014-10-23 NOTE — Progress Notes (Signed)
Pre visit review using our clinic review tool, if applicable. No additional management support is needed unless otherwise documented below in the visit note. 

## 2014-10-23 NOTE — Assessment & Plan Note (Signed)
Doing well on 1/2 tab of olanzapine despite the sudden death of her mother 2 weeks ago from a pneumonia secondary to lung cancer, she is sad and tired but she feels she is managing well.

## 2014-10-23 NOTE — Patient Instructions (Signed)
Grief Reaction Grief is a normal response to the death of someone close to you. Feelings of fear, anger, and guilt can affect almost everyone who loses someone they love. Symptoms of depression are also common. These include problems with sleep, loss of appetite, and lack of energy. These grief reaction symptoms often last for weeks to months after a loss. They may also return during special times that remind you of the person you lost, such as an anniversary or birthday. Anxiety, insomnia, irritability, and deep depression may last beyond the period of normal grief. If you experience these feelings for 6 months or longer, you may have clinical depression. Clinical depression requires further medical attention. If you think that you have clinical depression, you should contact your caregiver. If you have a history of depression or a family history of depression, you are at greater risk of clinical depression. You are also at greater risk of developing clinical depression if the loss was traumatic or the loss was of someone with whom you had unresolved issues.  A grief reaction can become complicated by being blocked. This means being unable to cry or express extreme emotions. This may prolong the grieving period and worsen the emotional effects of the loss. Mourning is a natural event in human life. A healthy grief reaction is one that is not blocked. It requires a time of sadness and readjustment. It is very important to share your sorrow and fear with others, especially close friends and family. Professional counselors and clergy can also help you process your grief. Document Released: 09/05/2005 Document Revised: 01/20/2014 Document Reviewed: 05/16/2006 ExitCare Patient Information 2015 [employer]. This information is not intended to replace advice given to you by your health care provider. Make sure you discuss any questions you have with your health care provider.  

## 2014-10-23 NOTE — Assessment & Plan Note (Addendum)
Doing well on Tramadol but reports she feels fuzzy headed in am until she takes it each am. May continue but is encouraged to take a several weeks break to see if it helps symptoms

## 2014-10-27 ENCOUNTER — Other Ambulatory Visit: Payer: Self-pay | Admitting: Family Medicine

## 2014-11-02 ENCOUNTER — Encounter: Payer: Self-pay | Admitting: Family Medicine

## 2014-11-02 NOTE — Assessment & Plan Note (Signed)
Improved with fiber and probiotic supplements 

## 2014-11-02 NOTE — Progress Notes (Signed)
Sara West  [id number] 09-18-1966 11/02/2014      Progress Note-Follow Up  Subjective  Chief Complaint  Chief Complaint  Patient presents with  . Follow-up    6 mos    HPI  Patient is a 49 y.o. female in today for routine medical care. She is doing somewhat better.She has had a good response to Zyprexa although she had a hard time getting it through her insurance she had to appeal through the [employer]. She feels well on the Zyprexa, less anxiety and she denies anhedonia. Has struggled with some lower extremity pain but denies injury. Her abdominal discomfort is better with probiotics and fiber. Denies CP/palp/SOB/HA/congestion/fevers/GI or GU c/o. Taking meds as prescribed  Past Medical History  Diagnosis Date  . ANXIETY STATE NEC 05/22/2007  . PSORIASIS 04/24/2007  . INSOMNIA-SLEEP DISORDER-UNSPEC 12/10/2009  . Plantar fasciitis, bilateral 03/05/2013  . Leg pain, bilateral 03/05/2013  . Bipolar disorder, unspecified 05/22/2007    Qualifier: Diagnosis of  By: Cato Mulligan MD, Bruce    . Weight gain 01/19/2014    Past Surgical History  Procedure Laterality Date  . Cholecystectomy    . Cesarean section      x2 requiring blood transfusion (BTL with 2nd one)  . Tonsilectomy, adenoidectomy, bilateral myringotomy and tubes      Family History  Problem Relation Age of Onset  . Alcohol abuse Mother   . Cancer Mother     lung  . Arthritis Sister     walker, uses for balance issues    History   Social History  . Marital Status: Married    Spouse Name: N/A  . Number of Children: N/A  . Years of Education: N/A   Occupational History  . 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 Smoking status: Former Smoker    Types: Cigarettes    Quit date: 11/19/2005  . Smokeless tobacco: Never Used  . Alcohol Use: Yes  . Drug Use: No  . Sexual Activity: Not on file   Other Topics Concern  . Not on file   Social History Narrative    Current Outpatient  Prescriptions on File Prior to Visit  Medication Sig Dispense Refill  . ALPRAZolam (XANAX) 0.25 MG tablet TAKE 1/2 TO 2 TABLETS BY MOUTH TWICE DAILY AS NEEDED FOR ANXIETY/INSOMNIA 90 tablet 1  . citalopram (CELEXA) 20 MG tablet TAKE 1 TABLET BY MOUTH ONCE DAILY 30 tablet 9  . HYDROcodone-acetaminophen (NORCO/VICODIN) 5-325 MG per tablet Take 1 tablet twice daily as needed for pain 40 tablet 0  . Multiple Vitamins-Minerals (WOMENS MULTIVITAMIN PLUS) TABS Take 1 each by mouth daily. ALIVE NATURAL MVI.    Marland Kitchen OLANZapine (ZYPREXA) 2.5 MG tablet TAKE 1 TABLET AT BEDTIME 30 tablet 2  . traMADol (ULTRAM) 50 MG tablet TAKE 1 TABLET 3 TIMES A DAY AS NEEDED FOR MODERATE PAIN 30 tablet 0   No current facility-administered medications on file prior to visit.    No Known Allergies  Review of Systems  Review of Systems  Constitutional: Negative for fever and malaise/fatigue.  HENT: Negative for congestion.   Eyes: Negative for discharge.  Respiratory: Negative for shortness of breath.   Cardiovascular: Negative for chest pain, palpitations and leg swelling.  Gastrointestinal: Negative for nausea, abdominal pain and diarrhea.  Genitourinary: Negative for dysuria.  Musculoskeletal: Negative for falls.  Skin: Negative for rash.  Neurological: Negative for loss of consciousness and headaches.  Endo/Heme/Allergies: Negative for polydipsia.  Psychiatric/Behavioral:  Negative for depression and suicidal ideas. The patient is nervous/anxious. The patient does not have insomnia.     Objective  BP 118/80 mmHg  Pulse 73  Temp(Src) 98.3 F (36.8 C) (Oral)  Ht 5' 4.75" (1.645 m)  Wt 156 lb 9.6 oz (71.033 kg)  BMI 26.25 kg/m2  SpO2 98%  LMP 10/09/2014  Physical Exam  Physical Exam  Constitutional: She is oriented to person, place, and time and well-developed, well-nourished, and in no distress. No distress.  HENT:  Head: Normocephalic and atraumatic.  Eyes: Conjunctivae are normal.  Neck: Neck  supple. No thyromegaly present.  Cardiovascular: Normal rate, regular rhythm and normal heart sounds.   No murmur heard. Pulmonary/Chest: Effort normal and breath sounds normal. She has no wheezes.  Abdominal: She exhibits no distension and no mass.  Musculoskeletal: She exhibits no edema.  Lymphadenopathy:    She has no cervical adenopathy.  Neurological: She is alert and oriented to person, place, and time.  Skin: Skin is warm and dry. No rash noted. She is not diaphoretic.  Psychiatric: Memory, affect and judgment normal.    Lab Results  Component Value Date   TSH 2.14 10/23/2014   Lab Results  Component Value Date   WBC 6.9 10/23/2014   HGB 13.0 10/23/2014   HCT 38.1 10/23/2014   MCV 89.0 10/23/2014   PLT 257.0 10/23/2014   Lab Results  Component Value Date   CREATININE 0.79 10/23/2014   BUN 14 10/23/2014   NA 136 10/23/2014   K 4.1 10/23/2014   CL 102 10/23/2014   CO2 28 10/23/2014   Lab Results  Component Value Date   ALT 25 10/23/2014   AST 23 10/23/2014   ALKPHOS 43 10/23/2014   BILITOT 0.3 10/23/2014   Lab Results  Component Value Date   CHOL 164 10/23/2014   Lab Results  Component Value Date   HDL 53.90 10/23/2014   Lab Results  Component Value Date   LDLCALC 91 10/23/2014   Lab Results  Component Value Date   TRIG 97.0 10/23/2014   Lab Results  Component Value Date   CHOLHDL 3 10/23/2014     Assessment & Plan  Bipolar disorder Doing well on 1/2 tab of olanzapine despite the sudden death of her mother 2 weeks ago from a pneumonia secondary to lung cancer, she is sad and tired but she feels she is managing well.   Leg pain, bilateral Doing well on Tramadol but reports she feels fuzzy headed in am until she takes it each am. May continue but is encouraged to take a several weeks break to see if it helps symptoms    Abdominal pain, epigastric Improved with fiber and probiotic supplements.

## 2014-11-07 ENCOUNTER — Telehealth: Payer: Self-pay

## 2014-11-07 NOTE — Telephone Encounter (Signed)
Spoke with patient and advised that UDS positive for Cannabis. Advised that she would need to test negative per Dr Abner GreenspanBlyth in order to continue receiving control substances from her.  Verbalizes understanding.

## 2014-11-17 ENCOUNTER — Other Ambulatory Visit: Payer: Self-pay | Admitting: Family Medicine

## 2014-11-18 NOTE — Telephone Encounter (Signed)
Faxed hardcopy for Tr[facility] to [facility] 

## 2015-01-09 ENCOUNTER — Other Ambulatory Visit: Payer: Self-pay | Admitting: Family Medicine

## 2015-01-09 ENCOUNTER — Encounter: Payer: Self-pay | Admitting: Family Medicine

## 2015-01-09 MED ORDER — CITALOPRAM HYDROBROMIDE 20 MG PO TABS: ORAL_TABLET | ORAL | Status: DC

## 2015-01-22 ENCOUNTER — Ambulatory Visit (INDEPENDENT_AMBULATORY_CARE_PROVIDER_SITE_OTHER): Payer: 59 | Admitting: Family Medicine

## 2015-01-22 ENCOUNTER — Encounter: Payer: Self-pay | Admitting: Family Medicine

## 2015-01-22 VITALS — BP 100/70 | HR 64 | Temp 98.3°F | Resp 16 | Ht 64.0 in | Wt 161.0 lb

## 2015-01-22 DIAGNOSIS — G4700 Insomnia, unspecified: Secondary | ICD-10-CM | POA: Diagnosis not present

## 2015-01-22 DIAGNOSIS — F319 Bipolar disorder, unspecified: Secondary | ICD-10-CM | POA: Diagnosis not present

## 2015-01-22 DIAGNOSIS — R635 Abnormal weight gain: Secondary | ICD-10-CM | POA: Diagnosis not present

## 2015-01-22 MED ORDER — ALPRAZOLAM 0.25 MG PO TABS: ORAL_TABLET | ORAL | Status: DC

## 2015-01-22 MED ORDER — OLANZAPINE 2.5 MG PO TABS: 2.5000 mg | ORAL_TABLET | Freq: Every day | ORAL | Status: DC

## 2015-01-22 NOTE — Assessment & Plan Note (Signed)
Encouraged good sleep hygiene such as dark, quiet room. No blue/green glowing lights such as computer screens in bedroom. No alcohol or stimulants in evening. Cut down on caffeine as able. Regular exercise is helpful but not just prior to bed time. Using Alprazolam very infrequently. May continue same, refill given

## 2015-01-22 NOTE — Progress Notes (Signed)
Pre visit review using our clinic review tool, if applicable. No additional management support is needed unless otherwise documented below in the visit note. 

## 2015-01-22 NOTE — Patient Instructions (Signed)
[facility] if things get worse you have to go there  Bipolar Disorder Bipolar disorder is a mental illness. The term bipolar disorder actually is used to describe a group of disorders that all share varying degrees of emotional highs and lows that can interfere with daily functioning, such as work, school, or relationships. Bipolar disorder also can lead to drug abuse, hospitalization, and suicide. The emotional highs of bipolar disorder are periods of elation or irritability and high energy. These highs can range from a mild form (hypomania) to a severe form (mania). People experiencing episodes of hypomania may appear energetic, excitable, and highly productive. People experiencing mania may behave impulsively or erratically. They often make poor decisions. They may have difficulty sleeping. The most severe episodes of mania can involve having very distorted beliefs or perceptions about the world and seeing or hearing things that are not real (psychotic delusions and hallucinations).  The emotional lows of bipolar disorder (depression) also can range from mild to severe. Severe episodes of bipolar depression can involve psychotic delusions and hallucinations. Sometimes people with bipolar disorder experience a state of mixed mood. Symptoms of hypomania or mania and depression are both present during this mixed-mood episode. SIGNS AND SYMPTOMS There are signs and symptoms of the episodes of hypomania and mania as well as the episodes of depression. The signs and symptoms of hypomania and mania are similar but vary in severity. They include:  Inflated self-esteem or feeling of increased self-confidence.  Decreased need for sleep.  Unusual talkativeness (rapid or pressured speech) or the feeling of a need to keep talking.  Sensation of racing thoughts or constant talking, with quick shifts between topics that may or may not be related (flight of  ideas).  Decreased ability to focus or concentrate.  Increased purposeful activity, such as work, studies, or social activity, or nonproductive activity, such as pacing, squirming and fidgeting, or finger and toe tapping.  Impulsive behavior and use of poor judgment, resulting in high-risk activities, such as having unprotected sex or spending excessive amounts of money. Signs and symptoms of depression include the following:   Feelings of sadness, hopelessness, or helplessness.  Frequent or uncontrollable episodes of crying.  Lack of feeling anything or caring about anything.  Difficulty sleeping or sleeping too much.  Inability to enjoy the things you used to enjoy.   Desire to be alone all the time.   Feelings of guilt or worthlessness.  Lack of energy or motivation.   Difficulty concentrating, remembering, or making decisions.  Change in appetite or weight beyond normal fluctuations.  Thoughts of death or the desire to harm yourself. DIAGNOSIS  Bipolar disorder is diagnosed through an assessment by your caregiver. Your caregiver will ask questions about your emotional episodes. There are two main types of bipolar disorder. People with type I bipolar disorder have manic episodes with or without depressive episodes. People with type II bipolar disorder have hypomanic episodes and major depressive episodes, which are more serious than mild depression. The type of bipolar disorder you have can make an important difference in how your illness is monitored and treated. Your caregiver may ask questions about your medical history and use of alcohol or drugs, including prescription medication. Certain medical conditions and substances also can cause emotional highs and lows that resemble bipolar disorder (secondary bipolar disorder).  TREATMENT  Bipolar disorder is a long-term illness. It is best controlled with continuous treatment rather than treatment only when symptoms occur.  The  following treatments can be prescribed for bipolar disorders:  Medication--Medication can be prescribed by a doctor that is an expert in treating mental disorders (psychiatrists). Medications called mood stabilizers are usually prescribed to help control the illness. Other medications are sometimes added if symptoms of mania, depression, or psychotic delusions and hallucinations occur despite the use of a mood stabilizer.  Talk therapy--Some forms of talk therapy are helpful in providing support, education, and guidance. A combination of medication and talk therapy is best for managing the disorder over time. A procedure in which electricity is applied to your brain through your scalp (electroconvulsive therapy) is used in cases of severe mania when medication and talk therapy do not work or work too slowly. Document Released: 12/12/2000 Document Revised: 12/31/2012 Document Reviewed: 10/01/2012 [facility] Patient Information 2015 [place]. This information is not intended to replace advice given to you by your health care provider. Make sure you discuss any questions you have with your health care provider.

## 2015-01-22 NOTE — Assessment & Plan Note (Signed)
celexa was increased to 20 mg tabs 1 1/2 tab po daily, patient did this herself and feels it has been helpful so will continue

## 2015-01-22 NOTE — Progress Notes (Signed)
Sara JourneyMaura A West  [id number] 1966/08/03 01/22/2015      Progress Note-Follow Up  Subjective  Chief Complaint  Chief Complaint  Patient presents with  . Follow-up    Also has medication concerns    HPI  Patient is a 49 y.o. female in today for routine medical care. Patient in today to discuss her difficulties. Continues to struggle with her grief regarding the loss of her mother and ongoing life stressors. Is working on some counseling and has recently increased her Celexa and does believe that's helpful. She denies recent ideation but overall she says she is improved. Is establishing with psychiatry. She is not taking any pain meds, any longer as they were not helping her pain. Denies CP/palp/SOB/HA/congestion/fevers/GI or GU c/o. Taking meds as prescribed  Past Medical History  Diagnosis Date  . ANXIETY STATE NEC 05/22/2007  . PSORIASIS 04/24/2007  . INSOMNIA-SLEEP DISORDER-UNSPEC 12/10/2009  . Plantar fasciitis, bilateral 03/05/2013  . Leg pain, bilateral 03/05/2013  . Bipolar disorder, unspecified 05/22/2007    Qualifier: Diagnosis of  By: Cato MulliganSwords MD, Bruce    . Weight gain 01/19/2014    Past Surgical History  Procedure Laterality Date  . Cholecystectomy    . Cesarean section      x2 requiring blood transfusion (BTL with 2nd one)  . Tonsilectomy, adenoidectomy, bilateral myringotomy and tubes      Family History  Problem Relation Age of Onset  . Alcohol abuse Mother   . Cancer Mother     lung, smoker  . Arthritis Sister     walker, uses for balance issues    History   Social History  . Marital Status: Married    Spouse Name: N/A  . Number of Children: N/A  . Years of Education: N/A   Occupational History  . 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 Smoking status: Former Smoker    Types: Cigarettes    Quit date: 11/19/2005  . Smokeless tobacco: Never Used  . Alcohol Use: Yes  . Drug Use: No  . Sexual Activity: Not on file   Other  Topics Concern  . Not on file   Social History Narrative    Current Outpatient Prescriptions on File Prior to Visit  Medication Sig Dispense Refill  . ALPRAZolam (XANAX) 0.25 MG tablet TAKE 1/2 TO 2 TABLETS BY MOUTH TWICE DAILY AS NEEDED FOR ANXIETY/INSOMNIA 90 tablet 1  . citalopram (CELEXA) 20 MG tablet Take 1 and 1/2 tablet  by mouth daily 45 tablet 1  . HYDROcodone-acetaminophen (NORCO/VICODIN) 5-325 MG per tablet Take 1 tablet twice daily as needed for pain 40 tablet 0  . Multiple Vitamins-Minerals (WOMENS MULTIVITAMIN PLUS) TABS Take 1 each by mouth daily. ALIVE NATURAL MVI.    Marland Kitchen. OLANZapine (ZYPREXA) 2.5 MG tablet TAKE 1 TABLET AT BEDTIME 30 tablet 2  . OLANZapine (ZYPREXA) 2.5 MG tablet TAKE 1 TABLET AT BEDTIME 30 tablet 2  . traMADol (ULTRAM) 50 MG tablet TAKE 1 TABLET BY MOUTH 3 TIMES A DAY AS NEEDED FOR MODERATE PAIN 30 tablet 0   No current facility-administered medications on file prior to visit.    No Known Allergies  Review of Systems  Review of Systems  Constitutional: Negative for fever and malaise/fatigue.  HENT: Negative for congestion.   Eyes: Negative for discharge.  Respiratory: Negative for shortness of breath.   Cardiovascular: Negative for chest pain, palpitations and leg swelling.  Gastrointestinal: Negative for nausea, abdominal pain and diarrhea.  Genitourinary: Negative for dysuria.  Musculoskeletal: Negative for falls.  Skin: Negative for rash.  Neurological: Negative for loss of consciousness and headaches.  Endo/Heme/Allergies: Negative for polydipsia.  Psychiatric/Behavioral: Negative for depression and suicidal ideas. The patient is not nervous/anxious and does not have insomnia.     Objective  BP 100/70 mmHg  Pulse 64  Temp(Src) 98.3 F (36.8 C) (Oral)  Resp 16  Ht 5\' 4"  (1.626 m)  Wt 161 lb (73.029 kg)  BMI 27.62 kg/m2  SpO2 97%  LMP 01/19/2015  Physical Exam  Physical Exam  Constitutional: She is oriented to person, place, and  time and well-developed, well-nourished, and in no distress. No distress.  HENT:  Head: Normocephalic and atraumatic.  Eyes: Conjunctivae are normal.  Neck: Neck supple. No thyromegaly present.  Cardiovascular: Normal rate, regular rhythm and normal heart sounds.   No murmur heard. Pulmonary/Chest: Effort normal and breath sounds normal. She has no wheezes.  Abdominal: She exhibits no distension and no mass.  Musculoskeletal: She exhibits no edema.  Lymphadenopathy:    She has no cervical adenopathy.  Neurological: She is alert and oriented to person, place, and time.  Skin: Skin is warm and dry. No rash noted. She is not diaphoretic.  Psychiatric: Memory, affect and judgment normal.    Lab Results  Component Value Date   TSH 2.14 10/23/2014   Lab Results  Component Value Date   WBC 6.9 10/23/2014   HGB 13.0 10/23/2014   HCT 38.1 10/23/2014   MCV 89.0 10/23/2014   PLT 257.0 10/23/2014   Lab Results  Component Value Date   CREATININE 0.79 10/23/2014   BUN 14 10/23/2014   NA 136 10/23/2014   K 4.1 10/23/2014   CL 102 10/23/2014   CO2 28 10/23/2014   Lab Results  Component Value Date   ALT 25 10/23/2014   AST 23 10/23/2014   ALKPHOS 43 10/23/2014   BILITOT 0.3 10/23/2014   Lab Results  Component Value Date   CHOL 164 10/23/2014   Lab Results  Component Value Date   HDL 53.90 10/23/2014   Lab Results  Component Value Date   LDLCALC 91 10/23/2014   Lab Results  Component Value Date   TRIG 97.0 10/23/2014   Lab Results  Component Value Date   CHOLHDL 3 10/23/2014     Assessment & Plan  Insomnia Encouraged good sleep hygiene such as dark, quiet room. No blue/green glowing lights such as computer screens in bedroom. No alcohol or stimulants in evening. Cut down on caffeine as able. Regular exercise is helpful but not just prior to bed time. Using Alprazolam very infrequently. May continue same, refill given   Bipolar disorder celexa was increased to  20 mg tabs 1 1/2 tab po daily, patient did this herself and feels it has been helpful so will continue   Weight gain Encouraged DASH diet, decrease po intake and increase exercise as tolerated. Needs 7-8 hours of sleep nightly. Avoid trans fats, eat small, frequent meals every 4-5 hours with lean proteins, complex carbs and healthy fats. Minimize simple carbs, GMO foods.

## 2015-02-07 NOTE — Assessment & Plan Note (Signed)
Encouraged DASH diet, decrease po intake and increase exercise as tolerated. Needs 7-8 hours of sleep nightly. Avoid trans fats, eat small, frequent meals every 4-5 hours with lean proteins, complex carbs and healthy fats. Minimize simple carbs, GMO foods. 

## 2015-03-02 ENCOUNTER — Other Ambulatory Visit: Payer: Self-pay | Admitting: Family Medicine

## 2015-03-15 ENCOUNTER — Other Ambulatory Visit: Payer: Self-pay | Admitting: Family Medicine

## 2015-03-16 ENCOUNTER — Other Ambulatory Visit: Payer: Self-pay | Admitting: Family Medicine

## 2015-04-03 ENCOUNTER — Other Ambulatory Visit (HOSPITAL_COMMUNITY): Payer: Self-pay | Admitting: Obstetrics

## 2015-04-03 DIAGNOSIS — Z1231 Encounter for screening mammogram for malignant neoplasm of breast: Secondary | ICD-10-CM

## 2015-04-07 ENCOUNTER — Encounter: Payer: Self-pay | Admitting: Family Medicine

## 2015-04-08 ENCOUNTER — Other Ambulatory Visit: Payer: Self-pay | Admitting: Family Medicine

## 2015-04-08 MED ORDER — TRAMADOL HCL 50 MG PO TABS: ORAL_TABLET | ORAL | Status: DC

## 2015-04-10 ENCOUNTER — Ambulatory Visit (HOSPITAL_BASED_OUTPATIENT_CLINIC_OR_DEPARTMENT_OTHER)
Admission: RE | Admit: 2015-04-10 | Discharge: 2015-04-10 | Disposition: A | Discharge status: Home / Self Care | Payer: Managed Care, Other (non HMO) | Source: Ambulatory Visit | Attending: Physician Assistant | Admitting: Physician Assistant

## 2015-04-10 ENCOUNTER — Ambulatory Visit (INDEPENDENT_AMBULATORY_CARE_PROVIDER_SITE_OTHER): Payer: Managed Care, Other (non HMO) | Admitting: Physician Assistant

## 2015-04-10 ENCOUNTER — Encounter: Payer: Self-pay | Admitting: Physician Assistant

## 2015-04-10 ENCOUNTER — Telehealth: Payer: Self-pay | Admitting: *Deleted

## 2015-04-10 VITALS — BP 106/80 | HR 63 | Temp 98.2°F | Ht 64.0 in | Wt 161.6 lb

## 2015-04-10 DIAGNOSIS — R413 Other amnesia: Secondary | ICD-10-CM

## 2015-04-10 DIAGNOSIS — R519 Headache, unspecified: Secondary | ICD-10-CM

## 2015-04-10 DIAGNOSIS — R51 Headache: Secondary | ICD-10-CM | POA: Diagnosis not present

## 2015-04-10 LAB — COMPREHENSIVE METABOLIC PANEL
ALT: 34 U/L (ref 0–35)
AST: 29 U/L (ref 0–37)
Albumin: 4.3 g/dL (ref 3.5–5.2)
Alkaline Phosphatase: 46 U/L (ref 39–117)
BUN: 21 mg/dL (ref 6–23)
CO2: 29 mEq/L (ref 19–32)
Calcium: 9.5 mg/dL (ref 8.4–10.5)
Chloride: 102 mEq/L (ref 96–112)
Creatinine, Ser: 0.8 mg/dL (ref 0.40–1.20)
GFR: 81.05 mL/min (ref >60.00)
Glucose, Bld: 92 mg/dL (ref 70–99)
Potassium: 4.3 mEq/L (ref 3.5–5.1)
Sodium: 137 mEq/L (ref 135–145)
Total Bilirubin: 0.2 mg/dL (ref 0.2–1.2)
Total Protein: 7 g/dL (ref 6.0–8.3)

## 2015-04-10 LAB — CBC WITH DIFFERENTIAL/PLATELET
Basophils Absolute: 0 10*3/uL (ref 0.0–0.1)
Basophils Relative: 0.4 % (ref 0.0–3.0)
Eosinophils Absolute: 0.1 10*3/uL (ref 0.0–0.7)
Eosinophils Relative: 1.8 % (ref 0.0–5.0)
HCT: 38 % (ref 36.0–46.0)
Hemoglobin: 12.8 g/dL (ref 12.0–15.0)
Lymphocytes Relative: 24.1 % (ref 12.0–46.0)
Lymphs Abs: 1.6 10*3/uL (ref 0.7–4.0)
MCHC: 33.7 g/dL (ref 30.0–36.0)
MCV: 91 fl (ref 78.0–100.0)
Monocytes Absolute: 0.5 10*3/uL (ref 0.1–1.0)
Monocytes Relative: 6.9 % (ref 3.0–12.0)
Neutro Abs: 4.5 10*3/uL (ref 1.4–7.7)
Neutrophils Relative %: 66.8 % (ref 43.0–77.0)
Platelets: 250 10*3/uL (ref 150.0–400.0)
RBC: 4.17 Mil/uL (ref 3.87–5.11)
RDW: 13 % (ref 11.5–15.5)
WBC: 6.8 10*3/uL (ref 4.0–10.5)

## 2015-04-10 LAB — VITAMIN B12: Vitamin B-12: 546 pg/mL (ref 211–911)

## 2015-04-10 LAB — TSH: TSH: 2.7 u[IU]/mL (ref 0.35–4.50)

## 2015-04-10 IMAGING — CT CT HEAD W/O CM
1 series · 16 of 30 positions shown, 20 images · non-contrast
Comparison: [DATE]

CLINICAL DATA: Headache involving the crown of the head.

EXAM:
CT HEAD WITHOUT CONTRAST
TECHNIQUE: Contiguous axial images were obtained from the base of the skull
through the vertex without intravenous contrast.

[Series 2: head 4.8 h37s · axial · 0.45mm/px · z∈[-149,-9]mm · 16 of 32 slices shown, 20 images]
[im 2/32  brain]
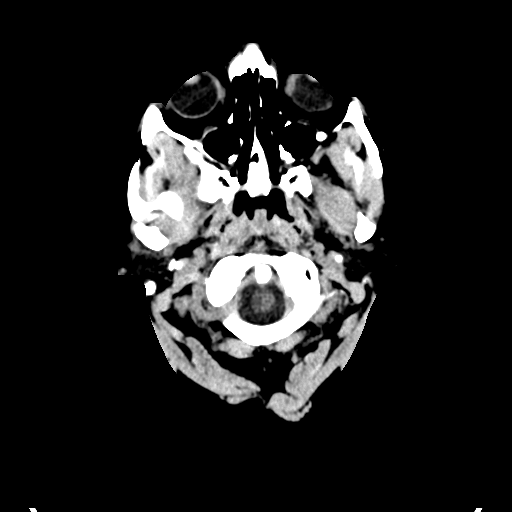
[im 2/32  bone]
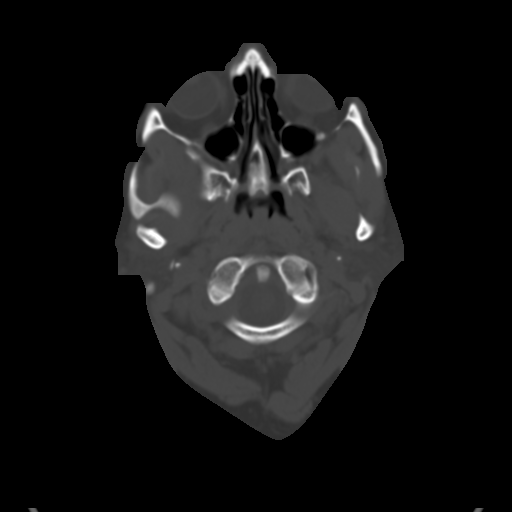
[im 4/32  brain]
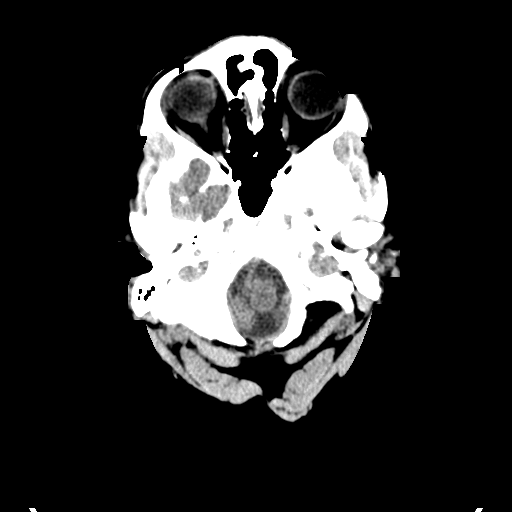
[im 6/32  brain]
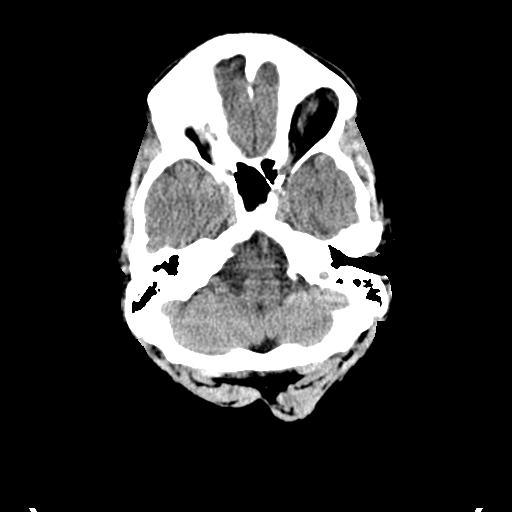
[im 8/32  brain]
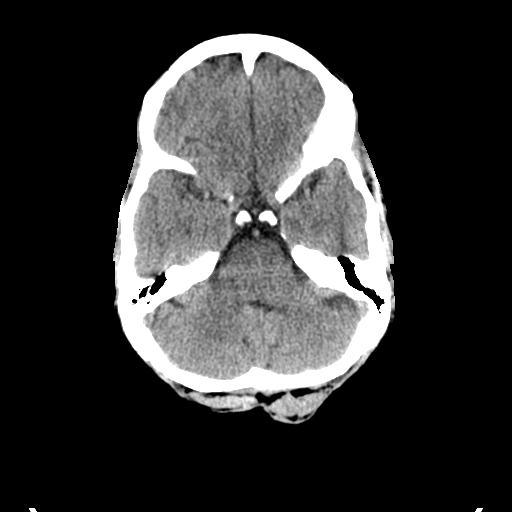
[im 9/32  brain]
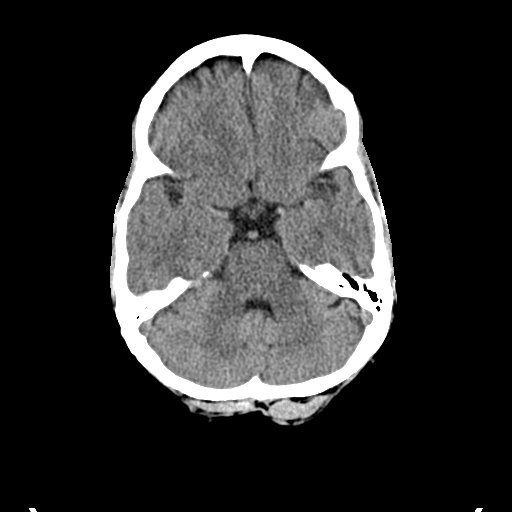
[im 9/32  bone]
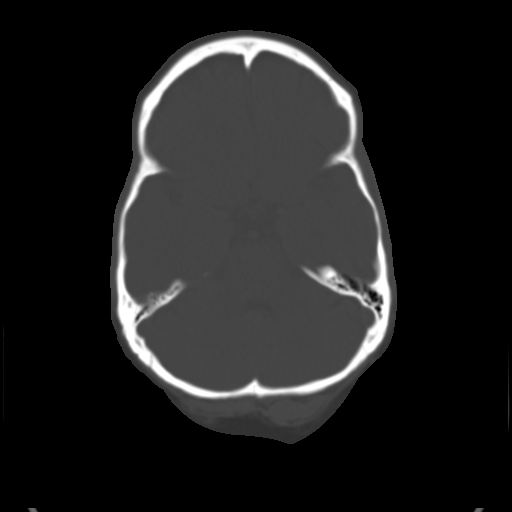
[im 11/32  brain]
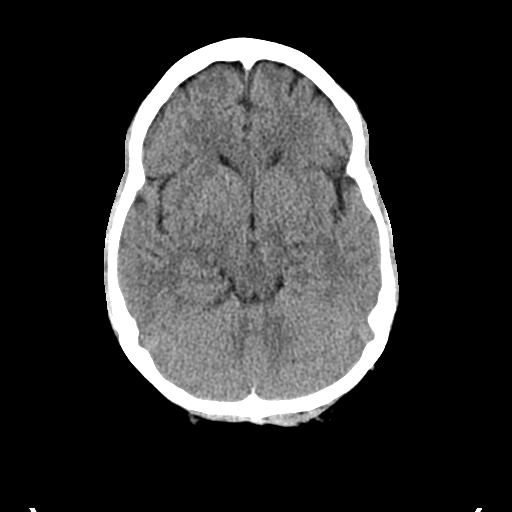
[im 13/32  brain]
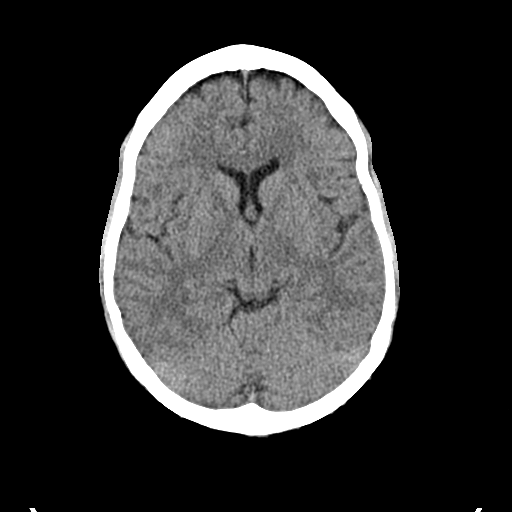
[im 15/32  brain]
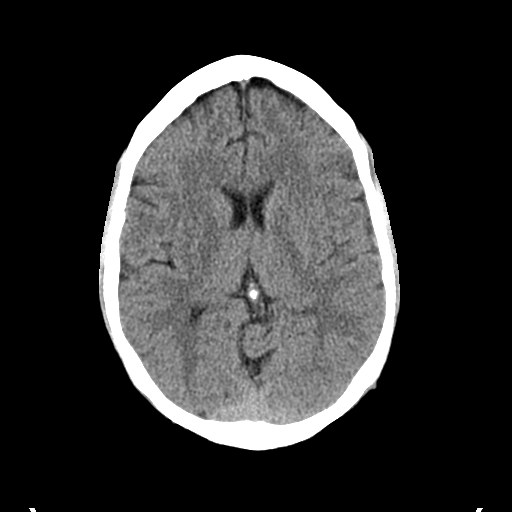
[im 17/32  brain]
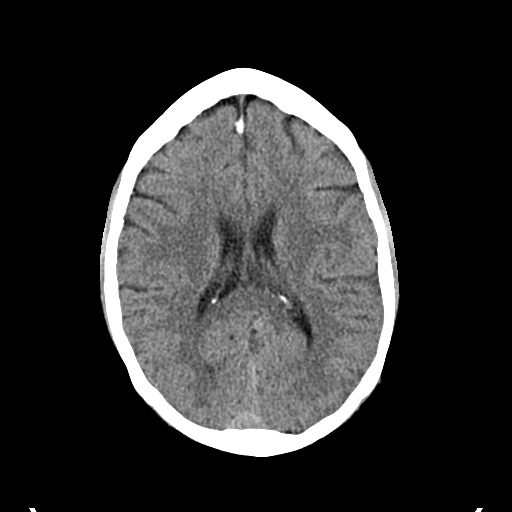
[im 17/32  bone]
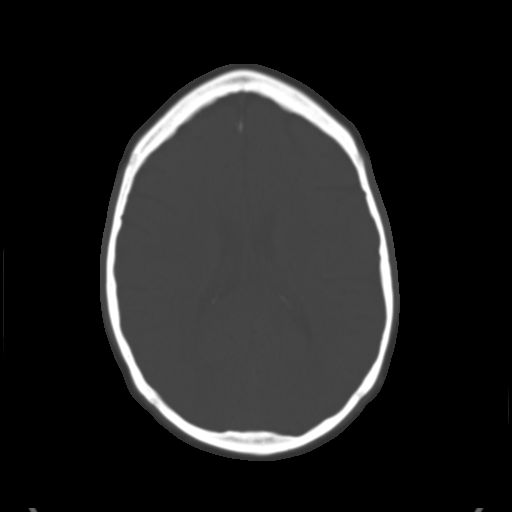
[im 19/32  brain]
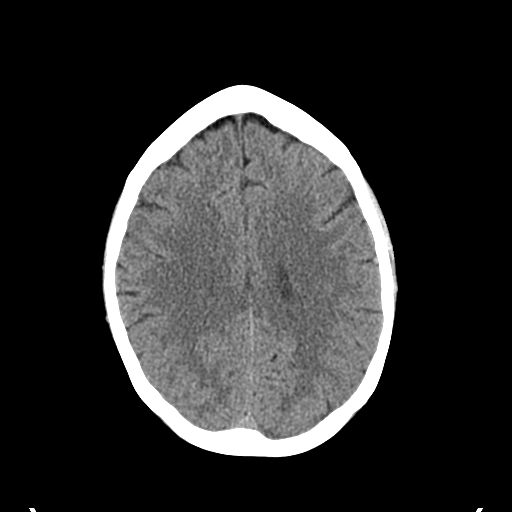
[im 21/32  brain]
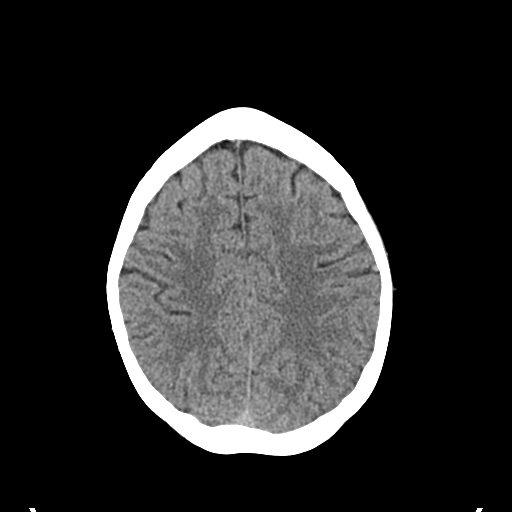
[im 23/32  brain]
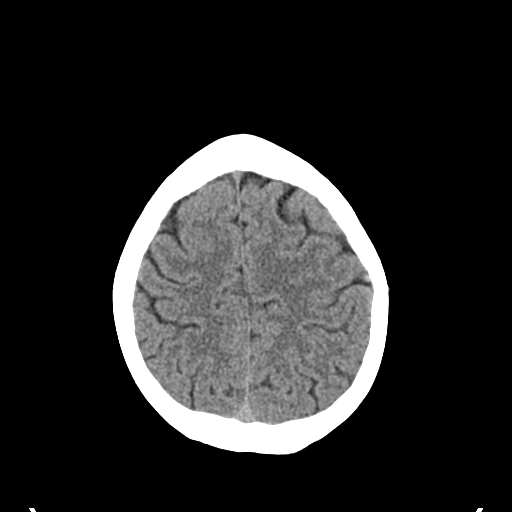
[im 24/32  brain]
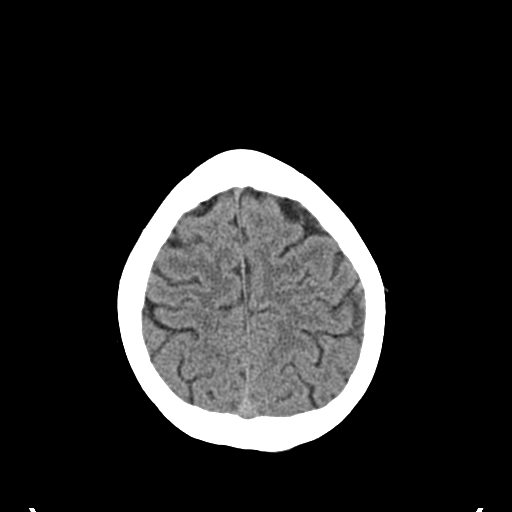
[im 24/32  bone]
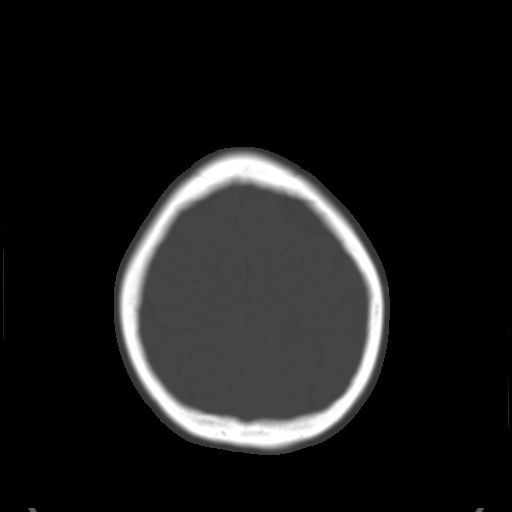
[im 26/32  brain]
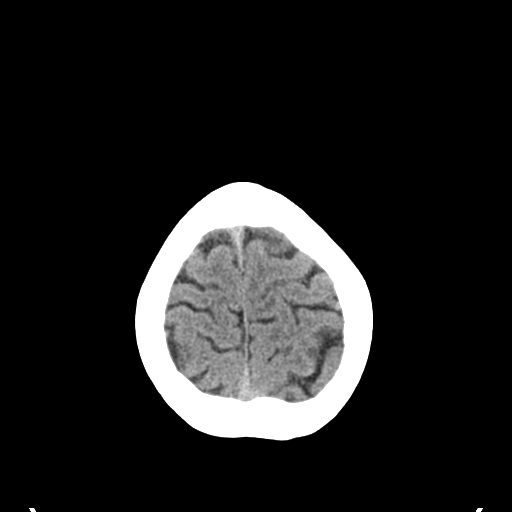
[im 28/32  brain]
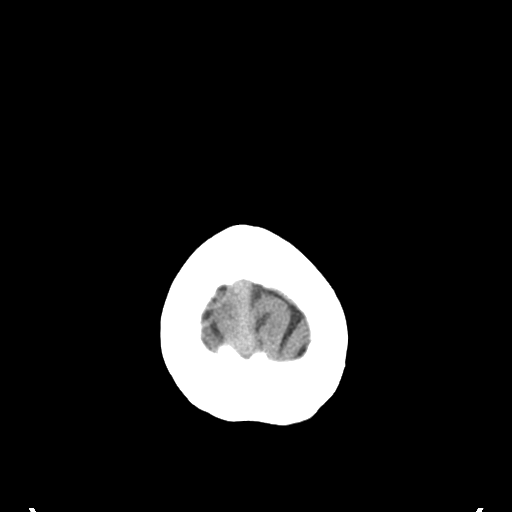
[im 30/32  brain]
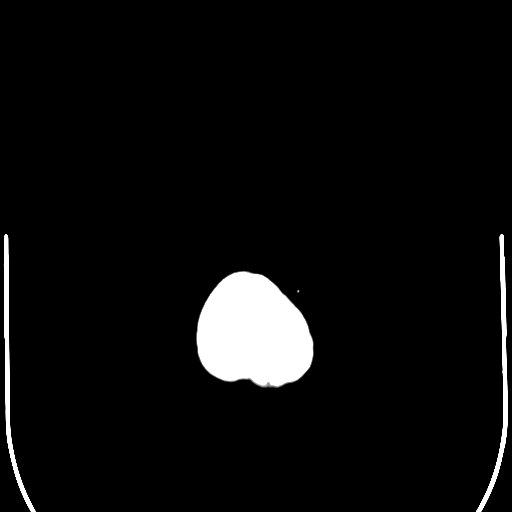

[16 of 30 positions shown; findings below may reference images not displayed]

FINDINGS: Gray-white differentiation is maintained. No CT evidence of acute
large territory infarct. No intraparenchymal or extra-axial mass or
hemorrhage. Normal size and configuration of the ventricles basilar
cisterns. No midline shift. There is underpneumatization the
bilateral frontal sinuses. The remaining paranasal sinuses and
mastoid air cells are normally aerated. No air-fluid levels.
Regional soft tissues appear normal. No displaced calvarial
fracture.
IMPRESSION: Negative noncontrast head CT.

## 2015-04-10 MED ORDER — MECLIZINE HCL 25 MG PO TABS: 25.0000 mg | ORAL_TABLET | Freq: Three times a day (TID) | ORAL | Status: DC | PRN

## 2015-04-10 MED ORDER — SUMATRIPTAN SUCCINATE 50 MG PO TABS: 50.0000 mg | ORAL_TABLET | Freq: Once | ORAL | Status: DC

## 2015-04-10 NOTE — Progress Notes (Signed)
Pre visit review using our clinic review tool, if applicable. No additional management support is needed unless otherwise documented below in the visit note. 

## 2015-04-10 NOTE — Telephone Encounter (Signed)
Spoke with the pt's husband and informed him of the pt's recent labs and CT results and note.  He verbalized understanding.  Pt was scheduled a follow-up appt with Dr. Abner Greenspan on Mon (04/13/15 @ 8:00am).//AB/CMA

## 2015-04-10 NOTE — Telephone Encounter (Signed)
-----   Message from Waldon Merl, PA-C sent at 04/10/2015  3:25 PM EDT ----- All labs and CT are normal. Recommend she continue instructions given today and follow-up on Monday with myself of Dr. Abner Greenspan for further assessment/management. If anything acutely worsens over the weekend, she is to go to the ER.

## 2015-04-10 NOTE — Patient Instructions (Signed)
Please go to the lab for blood work. Speak with Candise Bowens at the front desk to schedule your CT scan. I will call you with all of your results.  For headache, take Imitrex as directed. Stay well-hydrated and eat a well-balanced diet. Take Meclizine as directed for dizziness. You have fluid behind your ear which is likely contributing. Get some over-the-counter Flonase and use daily as directed.  Again I will call you with results but if anything acutely worsens, call 911 or have someone take you to the ER.  We will follow-up based on your results.

## 2015-04-12 DIAGNOSIS — R51 Headache: Principal | ICD-10-CM

## 2015-04-12 DIAGNOSIS — R519 Headache, unspecified: Secondary | ICD-10-CM | POA: Insufficient documentation

## 2015-04-12 NOTE — Progress Notes (Signed)
Patient presents to clinic today c/o intermittent headaches over the past 2 weeks associated with photophobia and phonophobia. Denies nausea or vomiting. Has noted some gradual issues with expressive and receptive aphasia that seem to come and go. Denies AMS, vision changes or LOC. Denies hx of CVA. Denies facial drooping, extremity weakness or sensory changes. Denies trauma or injury to head.   Past Medical History  Diagnosis Date  . ANXIETY STATE NEC 05/22/2007  . PSORIASIS 04/24/2007  . INSOMNIA-SLEEP DISORDER-UNSPEC 12/10/2009  . Plantar fasciitis, bilateral 03/05/2013  . Leg pain, bilateral 03/05/2013  . Bipolar disorder, unspecified 05/22/2007    Qualifier: Diagnosis of  By: Leanne Chang MD, Bruce    . Weight gain 01/19/2014    Current Outpatient Prescriptions on File Prior to Visit  Medication Sig Dispense Refill  . ALPRAZolam (XANAX) 0.25 MG tablet TAKE 1/2 TO 2 TABLETS BY MOUTH TWICE DAILY AS NEEDED FOR ANXIETY/INSOMNIA 90 tablet 2  . citalopram (CELEXA) 20 MG tablet TAKE 1 AND 1/2 TABLET BY MOUTH EVERY DAY 45 tablet 1  . Multiple Vitamins-Minerals (WOMENS MULTIVITAMIN PLUS) TABS Take 1 each by mouth daily. ALIVE NATURAL MVI.    Marland Kitchen OLANZapine (ZYPREXA) 2.5 MG tablet Take 1 tablet (2.5 mg total) by mouth at bedtime. 30 tablet 2  . traMADol (ULTRAM) 50 MG tablet TAKE 1 TABLET BY MOUTH 3 TIMES A DAY AS NEEDED FOR MODERATE PAIN 40 tablet 0   No current facility-administered medications on file prior to visit.    No Known Allergies  Family History  Problem Relation Age of Onset  . Alcohol abuse Mother   . Cancer Mother     lung, smoker  . Arthritis Sister     walker, uses for balance issues    History   Social History  . Marital Status: Married    Spouse Name: N/A  . Number of Children: N/A  . Years of Education: N/A   Occupational History  . 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 Smoking status: Former Smoker    Types: Cigarettes    Quit date:  11/19/2005  . Smokeless tobacco: Never Used  . Alcohol Use: Yes  . Drug Use: No  . Sexual Activity: Not on file   Other Topics Concern  . None   Social History Narrative   Review of Systems - See HPI.  All other ROS are negative.  BP 106/80 mmHg  Pulse 63  Temp(Src) 98.2 F (36.8 C) (Oral)  Ht 5' 4"  (1.626 m)  Wt 161 lb 9.6 oz (73.301 kg)  BMI 27.72 kg/m2  SpO2 97%  LMP 03/29/2015  Physical Exam  Constitutional: She is oriented to person, place, and time and well-developed, well-nourished, and in no distress.  HENT:  Head: Normocephalic and atraumatic.  Right Ear: External ear normal.  Left Ear: External ear normal.  Nose: Nose normal.  Mouth/Throat: Oropharynx is clear and moist. No oropharyngeal exudate.  Eyes: Conjunctivae are normal. Pupils are equal, round, and reactive to light.  Neck: Neck supple.  Cardiovascular: Normal rate, regular rhythm, normal heart sounds and intact distal pulses.   Pulmonary/Chest: Effort normal and breath sounds normal. No respiratory distress. She has no wheezes. She has no rales. She exhibits no tenderness.  Lymphadenopathy:    She has no cervical adenopathy.  Neurological: She is alert and oriented to person, place, and time. She has normal sensation, normal strength, normal reflexes and intact cranial nerves. She displays no weakness, facial  symmetry and normal stance. She has a normal Cerebellar Exam, a normal Heel to [facility] and a normal Tandem Gait Test. Gait normal. Coordination and gait normal. GCS score is 15.  Skin: Skin is warm and dry. No rash noted.  Psychiatric: Her mood appears anxious.   Recent Results (from the past 2160 hour(s))  CBC w/Diff     Status: None   Collection Time: 04/10/15 10:54 AM  Result Value Ref Range   WBC 6.8 4.0 - 10.5 K/uL   RBC 4.17 3.87 - 5.11 Mil/uL   Hemoglobin 12.8 12.0 - 15.0 g/dL   HCT 38.0 36.0 - 46.0 %   MCV 91.0 78.0 - 100.0 fl   MCHC 33.7 30.0 - 36.0 g/dL   RDW 13.0 11.5 - 15.5 %    Platelets 250.0 150.0 - 400.0 K/uL   Neutrophils Relative % 66.8 43.0 - 77.0 %   Lymphocytes Relative 24.1 12.0 - 46.0 %   Monocytes Relative 6.9 3.0 - 12.0 %   Eosinophils Relative 1.8 0.0 - 5.0 %   Basophils Relative 0.4 0.0 - 3.0 %   Neutro Abs 4.5 1.4 - 7.7 K/uL   Lymphs Abs 1.6 0.7 - 4.0 K/uL   Monocytes Absolute 0.5 0.1 - 1.0 K/uL   Eosinophils Absolute 0.1 0.0 - 0.7 K/uL   Basophils Absolute 0.0 0.0 - 0.1 K/uL  Comp Met (CMET)     Status: None   Collection Time: 04/10/15 10:54 AM  Result Value Ref Range   Sodium 137 135 - 145 mEq/L   Potassium 4.3 3.5 - 5.1 mEq/L   Chloride 102 96 - 112 mEq/L   CO2 29 19 - 32 mEq/L   Glucose, Bld 92 70 - 99 mg/dL   BUN 21 6 - 23 mg/dL   Creatinine, Ser 0.80 0.40 - 1.20 mg/dL   Total Bilirubin 0.2 0.2 - 1.2 mg/dL   Alkaline Phosphatase 46 39 - 117 U/L   AST 29 0 - 37 U/L   ALT 34 0 - 35 U/L   Total Protein 7.0 6.0 - 8.3 g/dL   Albumin 4.3 3.5 - 5.2 g/dL   Calcium 9.5 8.4 - 10.5 mg/dL   GFR 81.05 >60.00 mL/min  B12     Status: None   Collection Time: 04/10/15 10:54 AM  Result Value Ref Range   Vitamin B-12 546 211 - 911 pg/mL  TSH     Status: None   Collection Time: 04/10/15 10:54 AM  Result Value Ref Range   TSH 2.70 0.35 - 4.50 uIU/mL    Assessment/Plan: Severe headache Intermittent with some memory changes and difficulty with words (gradual onset). No aphasia noted during interview and Nervous system exam unremarkable. Will Rx Imitrex for migraine headache abortive therapy. Dietary measures discussed. Will check CBC, CMP and TSH. Will obtain STAT CT Head to further assess. Alarm signs/symptoms reviewed. ER if anything worsens. Follow-up next week. Will likely proceed with MRI/MRA or specialty referral based on follow-up findings.

## 2015-04-12 NOTE — Assessment & Plan Note (Signed)
Intermittent with some memory changes and difficulty with words (gradual onset). No aphasia noted during interview and Nervous system exam unremarkable. Will Rx Imitrex for migraine headache abortive therapy. Dietary measures discussed. Will check CBC, CMP and TSH. Will obtain STAT CT Head to further assess. Alarm signs/symptoms reviewed. ER if anything worsens. Follow-up next week. Will likely proceed with MRI/MRA or specialty referral based on follow-up findings.

## 2015-04-13 ENCOUNTER — Ambulatory Visit (INDEPENDENT_AMBULATORY_CARE_PROVIDER_SITE_OTHER): Payer: Managed Care, Other (non HMO) | Admitting: Family Medicine

## 2015-04-13 ENCOUNTER — Encounter: Payer: Self-pay | Admitting: Family Medicine

## 2015-04-13 VITALS — BP 92/76 | HR 65 | Temp 97.8°F | Resp 16 | Ht 65.0 in | Wt 163.8 lb

## 2015-04-13 DIAGNOSIS — R519 Headache, unspecified: Secondary | ICD-10-CM

## 2015-04-13 DIAGNOSIS — F319 Bipolar disorder, unspecified: Secondary | ICD-10-CM | POA: Diagnosis not present

## 2015-04-13 DIAGNOSIS — R51 Headache: Secondary | ICD-10-CM

## 2015-04-13 DIAGNOSIS — R635 Abnormal weight gain: Secondary | ICD-10-CM

## 2015-04-13 MED ORDER — CITALOPRAM HYDROBROMIDE 20 MG PO TABS: 20.0000 mg | ORAL_TABLET | Freq: Every day | ORAL | Status: DC

## 2015-04-13 NOTE — Assessment & Plan Note (Signed)
Is trying a diet called the Fast Metabolism Diet, encouraged to maintain protein.

## 2015-04-13 NOTE — Assessment & Plan Note (Signed)
Was in on 7/22 with migraines, she was having them frequently. Was able to sleep after medications given which helped and she feels well today but last week the headaches would develop in the afternoon. Has recently changed her diet to the Fast Metabolism Diet and drinking more water. CT scan of head was negative

## 2015-04-13 NOTE — Progress Notes (Signed)
Pre visit review using our clinic review tool, if applicable. No additional management support is needed unless otherwise documented below in the visit note. 

## 2015-04-13 NOTE — Progress Notes (Signed)
Sara West  [id number] 09/22/1965 04/13/2015      Progress Note-Follow Up  Subjective  Chief Complaint  Chief Complaint  Patient presents with  . Follow-up    migraine, states is completely better    HPI  Patient is a 49 y.o. female in today for routine medical care. Patient is in for follow-up. Was in 3 days ago with severe headache and was well treated. Her workup was negative and she feels better today. She technologist she had a headache every day last week that was migrainous and would develop in the afternoon. She was able to rest over the weekend and found that helpful. She is concerned about feeling spaced out and poor memory as well but acknowledges that is likely stress related. No recent fevers or congestion. No other neurologic complaints. Denies CP/palp/SOB/HA/congestion/fevers/GI or GU c/o. Taking meds as prescribed  Past Medical History  Diagnosis Date  . ANXIETY STATE NEC 05/22/2007  . PSORIASIS 04/24/2007  . INSOMNIA-SLEEP DISORDER-UNSPEC 12/10/2009  . Plantar fasciitis, bilateral 03/05/2013  . Leg pain, bilateral 03/05/2013  . Bipolar disorder, unspecified 05/22/2007    Qualifier: Diagnosis of  By: Cato Mulligan MD, Bruce    . Weight gain 01/19/2014    Past Surgical History  Procedure Laterality Date  . Cholecystectomy    . Cesarean section      x2 requiring blood transfusion (BTL with 2nd one)  . Tonsilectomy, adenoidectomy, bilateral myringotomy and tubes      Family History  Problem Relation Age of Onset  . Alcohol abuse Mother   . Cancer Mother     lung, smoker  . Arthritis Sister     walker, uses for balance issues    History   Social History  . Marital Status: Married    Spouse Name: N/A  . Number of Children: N/A  . Years of Education: N/A   Occupational History  . 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 Smoking status: Former Smoker    Types: Cigarettes    Quit date: 11/19/2005  . Smokeless tobacco: Never Used    . Alcohol Use: Yes  . Drug Use: No  . Sexual Activity: Not on file   Other Topics Concern  . Not on file   Social History Narrative    Current Outpatient Prescriptions on File Prior to Visit  Medication Sig Dispense Refill  . ALPRAZolam (XANAX) 0.25 MG tablet TAKE 1/2 TO 2 TABLETS BY MOUTH TWICE DAILY AS NEEDED FOR ANXIETY/INSOMNIA 90 tablet 2  . citalopram (CELEXA) 20 MG tablet TAKE 1 AND 1/2 TABLET BY MOUTH EVERY DAY 45 tablet 1  . meclizine (ANTIVERT) 25 MG tablet Take 1 tablet (25 mg total) by mouth 3 (three) times daily as needed for dizziness. 30 tablet 0  . Multiple Vitamins-Minerals (WOMENS MULTIVITAMIN PLUS) TABS Take 1 each by mouth daily. ALIVE NATURAL MVI.    Marland Kitchen OLANZapine (ZYPREXA) 2.5 MG tablet Take 1 tablet (2.5 mg total) by mouth at bedtime. 30 tablet 2  . SUMAtriptan (IMITREX) 50 MG tablet Take 1 tablet (50 mg total) by mouth once. May repeat in 2 hours if headache persists or recurs. 10 tablet 0  . traMADol (ULTRAM) 50 MG tablet TAKE 1 TABLET BY MOUTH 3 TIMES A DAY AS NEEDED FOR MODERATE PAIN 40 tablet 0   No current facility-administered medications on file prior to visit.    No Known Allergies  Review of Systems  Review of Systems  Constitutional: Positive  for malaise/fatigue. Negative for fever.  HENT: Negative for congestion.   Eyes: Negative for discharge.  Respiratory: Negative for shortness of breath.   Cardiovascular: Negative for chest pain, palpitations and leg swelling.  Gastrointestinal: Negative for nausea, abdominal pain and diarrhea.  Genitourinary: Negative for dysuria.  Musculoskeletal: Negative for falls.  Skin: Negative for rash.  Neurological: Positive for dizziness. Negative for loss of consciousness and headaches.  Endo/Heme/Allergies: Negative for polydipsia.  Psychiatric/Behavioral: Positive for depression. Negative for suicidal ideas. The patient is nervous/anxious. The patient does not have insomnia.     Objective  BP 92/76 mmHg   Pulse 65  Temp(Src) 97.8 F (36.6 C) (Oral)  Resp 16  Ht 5\' 5"  (1.651 m)  Wt 163 lb 12.8 oz (74.299 kg)  BMI 27.26 kg/m2  SpO2 94%  LMP 03/29/2015  Physical Exam  Physical Exam  Constitutional: She is oriented to person, place, and time and well-developed, well-nourished, and in no distress. No distress.  HENT:  Head: Normocephalic and atraumatic.  Right Ear: External ear normal.  Left Ear: External ear normal.  Nose: Nose normal.  Mouth/Throat: Oropharynx is clear and moist. No oropharyngeal exudate.  Eyes: Conjunctivae are normal. Pupils are equal, round, and reactive to light. Right eye exhibits no discharge. Left eye exhibits no discharge. No scleral icterus.  Neck: Normal range of motion. Neck supple. No thyromegaly present.  Cardiovascular: Normal rate, regular rhythm, normal heart sounds and intact distal pulses.   No murmur heard. Pulmonary/Chest: Effort normal and breath sounds normal. No respiratory distress. She has no wheezes. She has no rales.  Abdominal: Soft. Bowel sounds are normal. She exhibits no distension and no mass. There is no tenderness.  Musculoskeletal: Normal range of motion. She exhibits no edema or tenderness.  Lymphadenopathy:    She has no cervical adenopathy.  Neurological: She is alert and oriented to person, place, and time. She has normal reflexes. No cranial nerve deficit. Coordination normal.  Skin: Skin is warm and dry. No rash noted. She is not diaphoretic.  Psychiatric: Mood, memory and affect normal.    Lab Results  Component Value Date   TSH 2.70 04/10/2015   Lab Results  Component Value Date   WBC 6.8 04/10/2015   HGB 12.8 04/10/2015   HCT 38.0 04/10/2015   MCV 91.0 04/10/2015   PLT 250.0 04/10/2015   Lab Results  Component Value Date   CREATININE 0.80 04/10/2015   BUN 21 04/10/2015   NA 137 04/10/2015   K 4.3 04/10/2015   CL 102 04/10/2015   CO2 29 04/10/2015   Lab Results  Component Value Date   ALT 34 04/10/2015     AST 29 04/10/2015   ALKPHOS 46 04/10/2015   BILITOT 0.2 04/10/2015   Lab Results  Component Value Date   CHOL 164 10/23/2014   Lab Results  Component Value Date   HDL 53.90 10/23/2014   Lab Results  Component Value Date   LDLCALC 91 10/23/2014   Lab Results  Component Value Date   TRIG 97.0 10/23/2014   Lab Results  Component Value Date   CHOLHDL 3 10/23/2014     Assessment & Plan  Severe headache Was in on 7/22 with migraines, she was having them frequently. Was able to sleep after medications given which helped and she feels well today but last week the headaches would develop in the afternoon. Has recently changed her diet to the Fast Metabolism Diet and drinking more water. CT scan of head was negative  Bipolar disorder Was tolerating Citalopram at 1 1/2 pill after the death of her mother, is feeling better will try dropping back down to 1 tab daily. Concerned about memory but is overwhelmed by life stressors. Unlikely organic in nature more likely stress will continue to monitor  Weight gain Is trying a diet called the Fast Metabolism Diet, encouraged to maintain protein.

## 2015-04-13 NOTE — Assessment & Plan Note (Addendum)
Was tolerating Citalopram at 1 1/2 pill after the death of her mother, is feeling better will try dropping back down to 1 tab daily. Concerned about memory but is overwhelmed by life stressors. Unlikely organic in nature more likely stress will continue to monitor

## 2015-04-13 NOTE — Patient Instructions (Signed)
Generalized Anxiety Disorder Generalized anxiety disorder (GAD) is a mental disorder. It interferes with life functions, including relationships, work, and school. GAD is different from normal anxiety, which everyone experiences at some point in their lives in response to specific life events and activities. Normal anxiety actually helps us prepare for and get through these life events and activities. Normal anxiety goes away after the event or activity is over.  GAD causes anxiety that is not necessarily related to specific events or activities. It also causes excess anxiety in proportion to specific events or activities. The anxiety associated with GAD is also difficult to control. GAD can vary from mild to severe. People with severe GAD can have intense waves of anxiety with physical symptoms (panic attacks).  SYMPTOMS The anxiety and worry associated with GAD are difficult to control. This anxiety and worry are related to many life events and activities and also occur more days than not for 6 months or longer. People with GAD also have three or more of the following symptoms (one or more in children):  Restlessness.   Fatigue.  Difficulty concentrating.   Irritability.  Muscle tension.  Difficulty sleeping or unsatisfying sleep. DIAGNOSIS GAD is diagnosed through an assessment by your health care provider. Your health care provider will ask you questions aboutyour mood,physical symptoms, and events in your life. Your health care provider may ask you about your medical history and use of alcohol or drugs, including prescription medicines. Your health care provider may also do a physical exam and blood tests. Certain medical conditions and the use of certain substances can cause symptoms similar to those associated with GAD. Your health care provider may refer you to a mental health specialist for further evaluation. TREATMENT The following therapies are usually used to treat GAD:    Medication. Antidepressant medication usually is prescribed for long-term daily control. Antianxiety medicines may be added in severe cases, especially when panic attacks occur.   Talk therapy (psychotherapy). Certain types of talk therapy can be helpful in treating GAD by providing support, education, and guidance. A form of talk therapy called cognitive behavioral therapy can teach you healthy ways to think about and react to daily life events and activities.  Stress managementtechniques. These include yoga, meditation, and exercise and can be very helpful when they are practiced regularly. A mental health specialist can help determine which treatment is best for you. Some people see improvement with one therapy. However, other people require a combination of therapies. Document Released: 12/31/2012 Document Revised: 01/20/2014 Document Reviewed: 12/31/2012 ExitCare Patient Information 2015 [employer]. This information is not intended to replace advice given to you by your health care provider. Make sure you discuss any questions you have with your health care provider.  

## 2015-05-13 ENCOUNTER — Ambulatory Visit (HOSPITAL_COMMUNITY)
Admission: RE | Admit: 2015-05-13 | Discharge: 2015-05-13 | Disposition: A | Discharge status: Home / Self Care | Payer: Managed Care, Other (non HMO) | Source: Ambulatory Visit | Attending: Obstetrics | Admitting: Obstetrics

## 2015-05-13 DIAGNOSIS — Z1231 Encounter for screening mammogram for malignant neoplasm of breast: Secondary | ICD-10-CM | POA: Diagnosis not present

## 2015-05-18 ENCOUNTER — Other Ambulatory Visit: Payer: Self-pay | Admitting: Family Medicine

## 2015-05-18 NOTE — Telephone Encounter (Signed)
Requesting: Tramadol Contract  10/20/14 UDS  High Risk Last OV  04/13/15 Last Refill  04/08/15  #40 with 0 refills  Please Advise

## 2015-05-18 NOTE — Telephone Encounter (Signed)
Faxed hardcopy for tramadol to [facility]

## 2015-06-01 ENCOUNTER — Encounter: Payer: Self-pay | Admitting: Family Medicine

## 2015-06-01 MED ORDER — HYDROCODONE-ACETAMINOPHEN 5-325 MG PO TABS: 1.0000 | ORAL_TABLET | Freq: Every day | ORAL | Status: DC | PRN

## 2015-06-01 NOTE — Telephone Encounter (Signed)
Rx printed, MD signed.  Placed at front desk for pick-up and patient notified.

## 2015-07-02 ENCOUNTER — Other Ambulatory Visit: Payer: Self-pay | Admitting: Family Medicine

## 2015-07-02 NOTE — Telephone Encounter (Signed)
Requesting: Tramadol Contract  10/20/14 UDS  11/18/14 High Risk Last OV   04/13/15 Last Refill  #40 with 0 refills 04/28/15  Please Advise

## 2015-07-03 ENCOUNTER — Telehealth: Payer: Self-pay

## 2015-07-03 NOTE — Telephone Encounter (Signed)
Left message regarding Tramadol Rx ready for pick up at front desk.

## 2015-07-13 ENCOUNTER — Encounter: Payer: Self-pay | Admitting: Family Medicine

## 2015-07-14 ENCOUNTER — Other Ambulatory Visit: Payer: Self-pay | Admitting: Family Medicine

## 2015-07-14 MED ORDER — HYDROCODONE-ACETAMINOPHEN 5-325 MG PO TABS: 1.0000 | ORAL_TABLET | Freq: Every day | ORAL | Status: DC | PRN

## 2015-07-14 NOTE — Telephone Encounter (Signed)
Printed hydrocodone and on counter for signature per PCP instructions based on email request by the patient.

## 2015-08-12 ENCOUNTER — Other Ambulatory Visit: Payer: Self-pay | Admitting: Family Medicine

## 2015-08-24 ENCOUNTER — Other Ambulatory Visit: Payer: Self-pay | Admitting: Family Medicine

## 2015-08-25 NOTE — Telephone Encounter (Signed)
Faxed hardcopy for Tramadol to [facility] [place] 

## 2015-09-23 ENCOUNTER — Other Ambulatory Visit: Payer: Self-pay | Admitting: Family Medicine

## 2015-09-23 NOTE — Telephone Encounter (Signed)
Patient requesting refill of Zyprexa -- it seems she should have been out of medication for > 3 months as was last prescribed in May with 2 refills. I will defer refill to Dr. Abner GreenspanBlyth who is back tomorrow.

## 2015-09-23 NOTE — Telephone Encounter (Signed)
OK to refill Zyprexa but confirm with patient if se stopped med and has now restarted or if she is taking less meds. 30 day supply with 2 rf or 90 day supply

## 2015-09-24 MED ORDER — HYDROCODONE-ACETAMINOPHEN 5-325 MG PO TABS: 1.0000 | ORAL_TABLET | Freq: Every day | ORAL | Status: DC | PRN

## 2015-09-24 NOTE — Telephone Encounter (Signed)
callef left message to call back.

## 2015-09-24 NOTE — Telephone Encounter (Signed)
Spoke to the patient regarding zyprexa and she has continued taking, but only 1/2 per day.  Did refill #90 as she requested. Also while on the phone requested a refill of hydrocodone.  States has been since 06/2015 since last refill and uses rarely only if on her feet too long.

## 2015-09-24 NOTE — Telephone Encounter (Signed)
Called the patient left message to call back 

## 2015-09-25 NOTE — Telephone Encounter (Signed)
Called the patient left a detailed message hydrocodone was filled and ready for pickup at the front desk.

## 2015-10-01 ENCOUNTER — Other Ambulatory Visit: Payer: Self-pay | Admitting: Family Medicine

## 2015-10-01 MED ORDER — ALPRAZOLAM 0.25 MG PO TABS: ORAL_TABLET | ORAL | Status: DC

## 2015-10-01 NOTE — Telephone Encounter (Signed)
Requesting:  Alprazolam Contract  10/20/2014 UDS  High Risk, overdue Last OV  04/13/2015 Last Refill  #90 with 2 refills on 01/22/2015  Please Advise

## 2015-10-01 NOTE — Telephone Encounter (Signed)
OK to give a 1 month prescription supply but needs an appt for more

## 2015-10-01 NOTE — Telephone Encounter (Signed)
Faxed hardcopy for Alprazolam to [facility] [place].  Patient did agree to schedule appt and transferred to the front desk.

## 2015-10-25 ENCOUNTER — Other Ambulatory Visit: Payer: Self-pay | Admitting: Family Medicine

## 2015-11-09 ENCOUNTER — Encounter: Payer: Self-pay | Admitting: Family Medicine

## 2015-11-09 ENCOUNTER — Ambulatory Visit (INDEPENDENT_AMBULATORY_CARE_PROVIDER_SITE_OTHER): Payer: Managed Care, Other (non HMO) | Admitting: Family Medicine

## 2015-11-09 VITALS — BP 135/77 | HR 65 | Temp 98.4°F | Ht 65.0 in | Wt 167.0 lb

## 2015-11-09 DIAGNOSIS — R635 Abnormal weight gain: Secondary | ICD-10-CM | POA: Diagnosis not present

## 2015-11-09 DIAGNOSIS — F3170 Bipolar disorder, currently in remission, most recent episode unspecified: Secondary | ICD-10-CM

## 2015-11-09 DIAGNOSIS — R739 Hyperglycemia, unspecified: Secondary | ICD-10-CM | POA: Diagnosis not present

## 2015-11-09 MED ORDER — TRAMADOL HCL 50 MG PO TABS: 50.0000 mg | ORAL_TABLET | Freq: Four times a day (QID) | ORAL | Status: DC | PRN

## 2015-11-09 NOTE — Patient Instructions (Addendum)
Jobst stockings, 10-20 mmhg on in am off in pm, [facility]  Adderall for weight loss, focus and energy  DASH Eating Plan DASH stands for "Dietary Approaches to Stop Hypertension." The DASH eating plan is a healthy eating plan that has been shown to reduce high blood pressure (hypertension). Additional health benefits may include reducing the risk of type 2 diabetes mellitus, heart disease, and stroke. The DASH eating plan may also help with weight loss. WHAT DO I NEED TO KNOW ABOUT THE DASH EATING PLAN? For the DASH eating plan, you will follow these general guidelines:  Choose foods with a percent daily value for sodium of less than 5% (as listed on the food label).  Use salt-free seasonings or herbs instead of table salt or sea salt.  Check with your health care provider or pharmacist before using salt substitutes.  Eat lower-sodium products, often labeled as "lower sodium" or "no salt added."  Eat fresh foods.  Eat more vegetables, fruits, and low-fat dairy products.  Choose whole grains. Look for the word "whole" as the first word in the ingredient list.  Choose fish and skinless chicken or [place] more often than red meat. Limit fish, poultry, and meat to 6 oz (170 g) each day.  Limit sweets, desserts, sugars, and sugary drinks.  Choose heart-healthy fats.  Limit cheese to 1 oz (28 g) per day.  Eat more home-cooked food and less restaurant, buffet, and fast food.  Limit fried foods.  Cook foods using methods other than frying.  Limit canned vegetables. If you do use them, rinse them well to decrease the sodium.  When eating at a restaurant, ask that your food be prepared with less salt, or no salt if possible. WHAT FOODS CAN I EAT? Seek help from a dietitian for individual calorie needs. Grains Whole grain or whole wheat bread. Brown rice. Whole grain or whole wheat pasta. Quinoa, bulgur, and whole grain cereals. Low-sodium cereals. Corn or whole wheat  flour tortillas. Whole grain cornbread. Whole grain crackers. Low-sodium crackers. Vegetables Fresh or frozen vegetables (raw, steamed, roasted, or grilled). Low-sodium or reduced-sodium tomato and vegetable juices. Low-sodium or reduced-sodium tomato sauce and paste. Low-sodium or reduced-sodium canned vegetables.  Fruits All fresh, canned (in natural juice), or frozen fruits. Meat and Other Protein Products Ground beef (85% or leaner), grass-fed beef, or beef trimmed of fat. Skinless chicken or [place]. Ground chicken or [place]. Pork trimmed of fat. All fish and seafood. Eggs. Dried beans, peas, or lentils. Unsalted nuts and seeds. Unsalted canned beans. Dairy Low-fat dairy products, such as skim or 1% milk, 2% or reduced-fat cheeses, low-fat ricotta or cottage cheese, or plain low-fat yogurt. Low-sodium or reduced-sodium cheeses. Fats and Oils Tub margarines without trans fats. Light or reduced-fat mayonnaise and salad dressings (reduced sodium). Avocado. Safflower, olive, or canola oils. Natural peanut or almond butter. Other Unsalted popcorn and pretzels. The items listed above may not be a complete list of recommended foods or beverages. Contact your dietitian for more options. WHAT FOODS ARE NOT RECOMMENDED? Grains White bread. White pasta. White rice. Refined cornbread. Bagels and croissants. Crackers that contain trans fat. Vegetables Creamed or fried vegetables. Vegetables in a cheese sauce. Regular canned vegetables. Regular canned tomato sauce and paste. Regular tomato and vegetable juices. Fruits Dried fruits. Canned fruit in light or heavy syrup. Fruit juice. Meat and Other Protein Products Fatty cuts of meat. Ribs, chicken wings, bacon, sausage, bologna, salami, chitterlings, fatback, hot dogs, bratwurst, and packaged luncheon meats. Salted  nuts and seeds. Canned beans with salt. Dairy Whole or 2% milk, cream, half-and-half, and cream cheese. Whole-fat or sweetened yogurt.  Full-fat cheeses or blue cheese. Nondairy creamers and whipped toppings. Processed cheese, cheese spreads, or cheese curds. Condiments Onion and garlic salt, seasoned salt, table salt, and sea salt. Canned and packaged gravies. Worcestershire sauce. Tartar sauce. Barbecue sauce. Teriyaki sauce. Soy sauce, including reduced sodium. Steak sauce. Fish sauce. Oyster sauce. Cocktail sauce. Horseradish. Ketchup and mustard. Meat flavorings and tenderizers. Bouillon cubes. Hot sauce. Tabasco sauce. Marinades. Taco seasonings. Relishes. Fats and Oils Butter, stick margarine, lard, shortening, ghee, and bacon fat. Coconut, palm kernel, or palm oils. Regular salad dressings. Other Pickles and olives. Salted popcorn and pretzels. The items listed above may not be a complete list of foods and beverages to avoid. Contact your dietitian for more information. WHERE CAN I FIND MORE INFORMATION? National Heart, Lung, and Blood Institute: travelstabloid.com   This information is not intended to replace advice given to you by your health care provider. Make sure you discuss any questions you have with your health care provider.   Document Released: 08/25/2011 Document Revised: 09/26/2014 Document Reviewed: 07/10/2013 Elsevier Interactive Patient Education [employer].

## 2015-11-09 NOTE — Assessment & Plan Note (Signed)
Olanzapine has been helpful for her mood. Continue same meds for now.

## 2015-11-09 NOTE — Progress Notes (Signed)
Pre visit review using our clinic review tool, if applicable. No additional management support is needed unless otherwise documented below in the visit note. 

## 2015-11-09 NOTE — Assessment & Plan Note (Signed)
Has struggled with weight gain since starting Olanzapine. Eats well with 5 small meals a day. Is still struggling with the loss of her mother.

## 2015-11-10 ENCOUNTER — Other Ambulatory Visit (INDEPENDENT_AMBULATORY_CARE_PROVIDER_SITE_OTHER): Payer: Managed Care, Other (non HMO)

## 2015-11-10 ENCOUNTER — Other Ambulatory Visit: Payer: Managed Care, Other (non HMO)

## 2015-11-10 DIAGNOSIS — E0590 Thyrotoxicosis, unspecified without thyrotoxic crisis or storm: Secondary | ICD-10-CM

## 2015-11-10 LAB — COMPREHENSIVE METABOLIC PANEL
ALT: 24 U/L (ref 0–35)
AST: 19 U/L (ref 0–37)
Albumin: 4.3 g/dL (ref 3.5–5.2)
Alkaline Phosphatase: 36 U/L — ABNORMAL LOW (ref 39–117)
BUN: 14 mg/dL (ref 6–23)
CO2: 29 mEq/L (ref 19–32)
Calcium: 9.3 mg/dL (ref 8.4–10.5)
Chloride: 104 mEq/L (ref 96–112)
Creatinine, Ser: 0.8 mg/dL (ref 0.40–1.20)
GFR: 80.86 mL/min (ref >60.00)
Glucose, Bld: 84 mg/dL (ref 70–99)
Potassium: 4.2 mEq/L (ref 3.5–5.1)
Sodium: 139 mEq/L (ref 135–145)
Total Bilirubin: 0.2 mg/dL (ref 0.2–1.2)
Total Protein: 7.2 g/dL (ref 6.0–8.3)

## 2015-11-10 LAB — T4, FREE: Free T4: 0.75 ng/dL (ref 0.60–1.60)

## 2015-11-10 LAB — CBC
HCT: 36.4 % (ref 36.0–46.0)
Hemoglobin: 12.1 g/dL (ref 12.0–15.0)
MCHC: 33.4 g/dL (ref 30.0–36.0)
MCV: 90.4 fl (ref 78.0–100.0)
Platelets: 265 10*3/uL (ref 150.0–400.0)
RBC: 4.02 Mil/uL (ref 3.87–5.11)
RDW: 12.8 % (ref 11.5–15.5)
WBC: 7.2 10*3/uL (ref 4.0–10.5)

## 2015-11-10 LAB — LIPID PANEL
Cholesterol: 152 mg/dL (ref 0–200)
HDL: 46.7 mg/dL (ref >39.00)
LDL Cholesterol: 83 mg/dL (ref 0–99)
NonHDL: 105.44
Total CHOL/HDL Ratio: 3
Triglycerides: 110 mg/dL (ref 0.0–149.0)
VLDL: 22 mg/dL (ref 0.0–40.0)

## 2015-11-10 LAB — TSH: TSH: 5.3 u[IU]/mL — ABNORMAL HIGH (ref 0.35–4.50)

## 2015-11-11 ENCOUNTER — Other Ambulatory Visit: Payer: Self-pay | Admitting: Family Medicine

## 2015-11-11 DIAGNOSIS — E0590 Thyrotoxicosis, unspecified without thyrotoxic crisis or storm: Secondary | ICD-10-CM

## 2015-11-11 MED ORDER — LEVOTHYROXINE SODIUM 25 MCG PO TABS: 25.0000 ug | ORAL_TABLET | Freq: Every day | ORAL | Status: DC

## 2015-11-11 NOTE — Telephone Encounter (Signed)
Labs entered for return in 3 months

## 2015-11-20 ENCOUNTER — Encounter: Payer: Self-pay | Admitting: Family Medicine

## 2015-11-22 ENCOUNTER — Other Ambulatory Visit: Payer: Self-pay | Admitting: Family Medicine

## 2015-11-22 NOTE — Progress Notes (Signed)
Patient ID: Gale JourneyMaura A Mercier, female   DOB: 08-Feb-1966, 50 y.o.   MRN: [id number]   Subjective:    Patient ID: Gale JourneyMaura A Romer, female    DOB: 08-Feb-1966, 50 y.o.   MRN: [id number]  Chief Complaint  Patient presents with  . Follow-up    HPI Patient is in today for follow up. She is frustrated regarding weight gain but no other new or acute complaint. Still feels the Olanzapine is helpful. Denies CP/palp/SOB/HA/congestion/fevers/GI or GU c/o. Taking meds as prescribed  Past Medical History  Diagnosis Date  . ANXIETY STATE NEC 05/22/2007  . PSORIASIS 04/24/2007  . INSOMNIA-SLEEP DISORDER-UNSPEC 12/10/2009  . Plantar fasciitis, bilateral 03/05/2013  . Leg pain, bilateral 03/05/2013  . Bipolar disorder, unspecified (HCC) 05/22/2007    Qualifier: Diagnosis of  By: Cato MulliganSwords MD, Bruce    . Weight gain 01/19/2014    Past Surgical History  Procedure Laterality Date  . Cholecystectomy    . Cesarean section      x2 requiring blood transfusion (BTL with 2nd one)  . Tonsilectomy, adenoidectomy, bilateral myringotomy and tubes      Family History  Problem Relation Age of Onset  . Alcohol abuse Mother   . Cancer Mother     lung, smoker  . Arthritis Sister     walker, uses for balance issues    Social History   Social History  . Marital Status: Married    Spouse Name: N/A  . Number of Children: N/A  . Years of Education: N/A   Occupational History  . 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 Smoking status: Former Smoker    Types: Cigarettes    Quit date: 11/19/2005  . Smokeless tobacco: Never Used  . Alcohol Use: Yes  . Drug Use: No  . Sexual Activity: Not on file   Other Topics Concern  . Not on file   Social History Narrative    Outpatient Prescriptions Prior to Visit  Medication Sig Dispense Refill  . ALPRAZolam (XANAX) 0.25 MG tablet TAKE 1/2 TO 2 TABLETS BY MOUTH TWICE DAILY AS NEEDED FOR ANXIETY/INSOMNIA 90 tablet 2  . HYDROcodone-acetaminophen  (NORCO) 5-325 MG tablet Take 1 tablet by mouth daily as needed for severe pain. 20 tablet 0  . meclizine (ANTIVERT) 25 MG tablet Take 1 tablet (25 mg total) by mouth 3 (three) times daily as needed for dizziness. 30 tablet 0  . Multiple Vitamins-Minerals (WOMENS MULTIVITAMIN PLUS) TABS Take 1 each by mouth daily. ALIVE NATURAL MVI.    Marland Kitchen. OLANZapine (ZYPREXA) 2.5 MG tablet Take 1 tablet (2.5 mg total) by mouth at bedtime. 30 tablet 2  . citalopram (CELEXA) 20 MG tablet TAKE 1 & 1/2 TABLETS BY MOUTH ONCE DAILY 45 tablet 1  . OLANZapine (ZYPREXA) 2.5 MG tablet TAKE 1 TABLET AT BEDTIME 90 tablet 0  . OLANZapine (ZYPREXA) 2.5 MG tablet TAKE 1 TABLET AT BEDTIME 30 tablet 2  . traMADol (ULTRAM) 50 MG tablet TAKE 1 TABLET 3 TIMES A DAY AS NEEDED FOR MODERATE PAIN 40 tablet 0  . SUMAtriptan (IMITREX) 50 MG tablet Take 1 tablet (50 mg total) by mouth once. May repeat in 2 hours if headache persists or recurs. (Patient not taking: Reported on 11/09/2015) 10 tablet 0  . citalopram (CELEXA) 20 MG tablet Take 1 tablet (20 mg total) by mouth daily. 30 tablet 1   No facility-administered medications prior to visit.    No Known Allergies  Review of Systems  Constitutional: Positive for malaise/fatigue. Negative for fever.  HENT: Negative for congestion.   Eyes: Negative for discharge.  Respiratory: Negative for shortness of breath.   Cardiovascular: Negative for chest pain, palpitations and leg swelling.  Gastrointestinal: Negative for nausea and abdominal pain.  Genitourinary: Negative for dysuria.  Musculoskeletal: Negative for falls.  Skin: Negative for rash.  Neurological: Negative for loss of consciousness and headaches.  Endo/Heme/Allergies: Negative for environmental allergies.  Psychiatric/Behavioral: Positive for depression. The patient is not nervous/anxious.        Objective:    Physical Exam  Constitutional: She is oriented to person, place, and time. She appears well-developed and  well-nourished. No distress.  HENT:  Head: Normocephalic and atraumatic.  Nose: Nose normal.  Eyes: Right eye exhibits no discharge. Left eye exhibits no discharge.  Neck: Normal range of motion. Neck supple.  Cardiovascular: Normal rate and regular rhythm.   No murmur heard. Pulmonary/Chest: Effort normal and breath sounds normal.  Abdominal: Soft. Bowel sounds are normal. There is no tenderness.  Musculoskeletal: She exhibits no edema.  Neurological: She is alert and oriented to person, place, and time.  Skin: Skin is warm and dry.  Psychiatric: She has a normal mood and affect.  Nursing note and vitals reviewed.   BP 135/77 mmHg  Pulse 65  Temp(Src) 98.4 F (36.9 C) (Oral)  Ht  (1.651 m)  Wt 167 lb (75.751 kg)  BMI 27.79 kg/m2  SpO2 98% Wt Readings from Last 3 Encounters:  11/09/15 167 lb (75.751 kg)  04/13/15 163 lb 12.8 oz (74.299 kg)  04/10/15 161 lb 9.6 oz (73.301 kg)     Lab Results  Component Value Date   WBC 7.2 11/09/2015   HGB 12.1 11/09/2015   HCT 36.4 11/09/2015   PLT 265.0 11/09/2015   GLUCOSE 84 11/09/2015   CHOL 152 11/09/2015   TRIG 110.0 11/09/2015   HDL 46.70 11/09/2015   LDLCALC 83 11/09/2015   ALT 24 11/09/2015   AST 19 11/09/2015   NA 139 11/09/2015   K 4.2 11/09/2015   CL 104 11/09/2015   CREATININE 0.80 11/09/2015   BUN 14 11/09/2015   CO2 29 11/09/2015   TSH 5.30* 11/09/2015    Lab Results  Component Value Date   TSH 5.30* 11/09/2015   Lab Results  Component Value Date   WBC 7.2 11/09/2015   HGB 12.1 11/09/2015   HCT 36.4 11/09/2015   MCV 90.4 11/09/2015   PLT 265.0 11/09/2015   Lab Results  Component Value Date   NA 139 11/09/2015   K 4.2 11/09/2015   CO2 29 11/09/2015   GLUCOSE 84 11/09/2015   BUN 14 11/09/2015   CREATININE 0.80 11/09/2015   BILITOT 0.2 11/09/2015   ALKPHOS 36* 11/09/2015   AST 19 11/09/2015   ALT 24 11/09/2015   PROT 7.2 11/09/2015   ALBUMIN 4.3 11/09/2015   CALCIUM 9.3 11/09/2015   GFR  80.86 11/09/2015   Lab Results  Component Value Date   CHOL 152 11/09/2015   Lab Results  Component Value Date   HDL 46.70 11/09/2015   Lab Results  Component Value Date   LDLCALC 83 11/09/2015   Lab Results  Component Value Date   TRIG 110.0 11/09/2015   Lab Results  Component Value Date   CHOLHDL 3 11/09/2015   No results found for: HGBA1C     Assessment & Plan:   Problem List Items Addressed This Visit    Bipolar disorder (HCC)  Olanzapine has been helpful for her mood. Continue same meds for now.      Relevant Medications   traMADol (ULTRAM) 50 MG tablet   Other Relevant Orders   CBC (Completed)   TSH (Completed)   Comprehensive metabolic panel (Completed)   Lipid panel (Completed)   Weight gain - Primary    Has struggled with weight gain since starting Olanzapine. Eats well with 5 small meals a day. Is still struggling with the loss of her mother.       Relevant Medications   traMADol (ULTRAM) 50 MG tablet   Other Relevant Orders   CBC (Completed)   TSH (Completed)   Comprehensive metabolic panel (Completed)   Lipid panel (Completed)    Other Visit Diagnoses    Hyperglycemia        Relevant Medications    traMADol (ULTRAM) 50 MG tablet    Other Relevant Orders    CBC (Completed)    TSH (Completed)    Comprehensive metabolic panel (Completed)    Lipid panel (Completed)       I have discontinued Ms. Barra's citalopram. I have also changed her traMADol. Additionally, I am having her maintain her WOMENS MULTIVITAMIN PLUS, OLANZapine, SUMAtriptan, meclizine, HYDROcodone-acetaminophen, and ALPRAZolam.  Meds ordered this encounter  Medications  . traMADol (ULTRAM) 50 MG tablet    Sig: Take 1 tablet (50 mg total) by mouth every 6 (six) hours as needed.    Dispense:  40 tablet    Refill:  0     Danise Edge, MD

## 2015-11-23 ENCOUNTER — Other Ambulatory Visit: Payer: Self-pay | Admitting: Family Medicine

## 2015-11-23 MED ORDER — AMPHETAMINE-DEXTROAMPHETAMINE 10 MG PO TABS: 10.0000 mg | ORAL_TABLET | Freq: Every day | ORAL | Status: DC

## 2015-12-21 ENCOUNTER — Encounter: Payer: Self-pay | Admitting: Family Medicine

## 2015-12-21 ENCOUNTER — Ambulatory Visit (INDEPENDENT_AMBULATORY_CARE_PROVIDER_SITE_OTHER): Payer: Managed Care, Other (non HMO) | Admitting: Family Medicine

## 2015-12-21 VITALS — BP 108/72 | HR 76 | Temp 98.3°F | Ht 65.0 in | Wt 162.5 lb

## 2015-12-21 DIAGNOSIS — G4700 Insomnia, unspecified: Secondary | ICD-10-CM

## 2015-12-21 DIAGNOSIS — F3170 Bipolar disorder, currently in remission, most recent episode unspecified: Secondary | ICD-10-CM

## 2015-12-21 MED ORDER — AMPHETAMINE-DEXTROAMPHETAMINE 10 MG PO TABS: 10.0000 mg | ORAL_TABLET | Freq: Every day | ORAL | Status: DC

## 2015-12-21 NOTE — Patient Instructions (Signed)
Attention Deficit Hyperactivity Disorder  Attention deficit hyperactivity disorder (ADHD) is a problem with behavior issues based on the way the brain functions (neurobehavioral disorder). It is a common reason for behavior and academic problems in school.  SYMPTOMS   There are 3 types of ADHD. The 3 types and some of the symptoms include:  · Inattentive.    Gets bored or distracted easily.    Loses or forgets things. Forgets to hand in homework.    Has trouble organizing or completing tasks.    Difficulty staying on task.    An inability to organize daily tasks and school work.    Leaving projects, chores, or homework unfinished.    Trouble paying attention or responding to details. Careless mistakes.    Difficulty following directions. Often seems like is not listening.    Dislikes activities that require sustained attention (like chores or homework).  · Hyperactive-impulsive.    Feels like it is impossible to sit still or stay in a seat. Fidgeting with hands and feet.    Trouble waiting turn.    Talking too much or out of turn. Interruptive.    Speaks or acts impulsively.    Aggressive, disruptive behavior.    Constantly busy or on the go; noisy.    Often leaves seat when they are expected to remain seated.    Often runs or climbs where it is not appropriate, or feels very restless.  · Combined.    Has symptoms of both of the above.  Often children with ADHD feel discouraged about themselves and with school. They often perform well below their abilities in school.  As children get older, the excess motor activities can calm down, but the problems with paying attention and staying organized persist. Most children do not outgrow ADHD but with good treatment can learn to cope with the symptoms.  DIAGNOSIS   When ADHD is suspected, the diagnosis should be made by professionals trained in ADHD. This professional will collect information about the individual suspected of having ADHD. Information must be collected from  various settings where the person lives, works, or attends school.    Diagnosis will include:  · Confirming symptoms began in childhood.  · Ruling out other reasons for the child's behavior.  · The health care providers will check with the child's school and check their medical records.  · They will talk to teachers and parents.  · Behavior rating scales for the child will be filled out by those dealing with the child on a daily basis.  A diagnosis is made only after all information has been considered.  TREATMENT   Treatment usually includes behavioral treatment, tutoring or extra support in school, and stimulant medicines. Because of the way a person's brain works with ADHD, these medicines decrease impulsivity and hyperactivity and increase attention. This is different than how they would work in a person who does not have ADHD. Other medicines used include antidepressants and certain blood pressure medicines.  Most experts agree that treatment for ADHD should address all aspects of the person's functioning. Along with medicines, treatment should include structured classroom management at school. Parents should reward good behavior, provide constant discipline, and set limits. Tutoring should be available for the child as needed.  ADHD is a lifelong condition. If untreated, the disorder can have long-term serious effects into adolescence and adulthood.  HOME CARE INSTRUCTIONS   · Often with ADHD there is a lot of frustration among family members dealing with the condition. Blame   and anger are also feelings that are common. In many cases, because the problem affects the family as a whole, the entire family may need help. A therapist can help the family find better ways to handle the disruptive behaviors of the person with ADHD and promote change. If the person with ADHD is young, most of the therapist's work is with the parents. Parents will learn techniques for coping with and improving their child's behavior.  Sometimes only the child with the ADHD needs counseling. Your health care providers can help you make these decisions.  · Children with ADHD may need help learning how to organize. Some helpful tips include:  ¨ Keep routines the same every day from wake-up time to bedtime. Schedule all activities, including homework and playtime. Keep the schedule in a place where the person with ADHD will often see it. Mark schedule changes as far in advance as possible.  ¨ Schedule outdoor and indoor recreation.  ¨ Have a place for everything and keep everything in its place. This includes clothing, backpacks, and school supplies.  ¨ Encourage writing down assignments and bringing home needed books. Work with your child's teachers for assistance in organizing school work.  · Offer your child a well-balanced diet. Breakfast that includes a balance of whole grains, protein, and fruits or vegetables is especially important for school performance. Children should avoid drinks with caffeine including:  ¨ Soft drinks.  ¨ Coffee.  ¨ Tea.  ¨ However, some older children (adolescents) may find these drinks helpful in improving their attention. Because it can also be common for adolescents with ADHD to become addicted to caffeine, talk with your health care provider about what is a safe amount of caffeine intake for your child.  · Children with ADHD need consistent rules that they can understand and follow. If rules are followed, give small rewards. Children with ADHD often receive, and expect, criticism. Look for good behavior and praise it. Set realistic goals. Give clear instructions. Look for activities that can foster success and self-esteem. Make time for pleasant activities with your child. Give lots of affection.  · Parents are their children's greatest advocates. Learn as much as possible about ADHD. This helps you become a stronger and better advocate for your child. It also helps you educate your child's teachers and instructors  if they feel inadequate in these areas. Parent support groups are often helpful. A national group with local chapters is called Children and Adults with Attention Deficit Hyperactivity Disorder (CHADD).  SEEK MEDICAL CARE IF:  · Your child has repeated muscle twitches, cough, or speech outbursts.  · Your child has sleep problems.  · Your child has a marked loss of appetite.  · Your child develops depression.  · Your child has new or worsening behavioral problems.  · Your child develops dizziness.  · Your child has a racing heart.  · Your child has stomach pains.  · Your child develops headaches.  SEEK IMMEDIATE MEDICAL CARE IF:  · Your child has been diagnosed with depression or anxiety and the symptoms seem to be getting worse.  · Your child has been depressed and suddenly appears to have increased energy or motivation.  · You are worried that your child is having a bad reaction to a medication he or she is taking for ADHD.     This information is not intended to replace advice given to you by your health care provider. Make sure you discuss any questions you have with your   health care provider.     Document Released: 08/26/2002 Document Revised: 09/10/2013 Document Reviewed: 05/13/2013  Elsevier Interactive Patient Education ©2016 Elsevier Inc.

## 2015-12-21 NOTE — Assessment & Plan Note (Signed)
Encouraged good sleep hygiene such as dark, quiet room. No blue/green glowing lights such as computer screens in bedroom. No alcohol or stimulants in evening. Cut down on caffeine as able. Regular exercise is helpful but not just prior to bed time.  

## 2015-12-21 NOTE — Progress Notes (Signed)
Pre visit review using our clinic review tool, if applicable. No additional management support is needed unless otherwise documented below in the visit note. 

## 2015-12-21 NOTE — Assessment & Plan Note (Signed)
Doing very well on current meds and essential oils. She is using Adderall 5 mg as needed with good results refills given

## 2015-12-21 NOTE — Progress Notes (Signed)
Subjective:    Patient ID: Sara West, female    DOB: 10-Mar-1966, 50 y.o.   MRN: [id number]  Chief Complaint  Patient presents with  . Follow-up    adderall    HPI Patient is in today for follow up on medication.  Patient feels like she is doing well on medication.  She has been using Adderall 5 mg qd to bid prn for focus at work and feels well. Stays more focused, gets more done and feels better. When she tried to take 10 mg she felt more anxious. Denies CP/palp/SOB/HA/congestion/fevers/GI or GU c/o. Taking meds as prescribed  Past Medical History  Diagnosis Date  . ANXIETY STATE NEC 05/22/2007  . PSORIASIS 04/24/2007  . INSOMNIA-SLEEP DISORDER-UNSPEC 12/10/2009  . Plantar fasciitis, bilateral 03/05/2013  . Leg pain, bilateral 03/05/2013  . Bipolar disorder, unspecified (HCC) 05/22/2007    Qualifier: Diagnosis of  By: Cato Mulligan MD, Bruce    . Weight gain 01/19/2014    Past Surgical History  Procedure Laterality Date  . Cholecystectomy    . Cesarean section      x2 requiring blood transfusion (BTL with 2nd one)  . Tonsilectomy, adenoidectomy, bilateral myringotomy and tubes      Family History  Problem Relation Age of Onset  . Alcohol abuse Mother   . Cancer Mother     lung, smoker  . Arthritis Sister     walker, uses for balance issues    Social History   Social History  . Marital Status: Married    Spouse Name: N/A  . Number of Children: N/A  . Years of Education: N/A   Occupational History  . 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 Smoking status: Former Smoker    Types: Cigarettes    Quit date: 11/19/2005  . Smokeless tobacco: Never Used  . Alcohol Use: Yes  . Drug Use: No  . Sexual Activity: Not on file   Other Topics Concern  . Not on file   Social History Narrative    Outpatient Prescriptions Prior to Visit  Medication Sig Dispense Refill  . ALPRAZolam (XANAX) 0.25 MG tablet TAKE 1/2 TO 2 TABLETS BY MOUTH TWICE DAILY AS  NEEDED FOR ANXIETY/INSOMNIA 90 tablet 2  . citalopram (CELEXA) 20 MG tablet TAKE 1 & 1/2 TABLETS BY MOUTH ONCE DAILY 45 tablet 1  . HYDROcodone-acetaminophen (NORCO) 5-325 MG tablet Take 1 tablet by mouth daily as needed for severe pain. 20 tablet 0  . levothyroxine (SYNTHROID, LEVOTHROID) 25 MCG tablet Take 1 tablet (25 mcg total) by mouth daily. 30 tablet 3  . meclizine (ANTIVERT) 25 MG tablet Take 1 tablet (25 mg total) by mouth 3 (three) times daily as needed for dizziness. 30 tablet 0  . Multiple Vitamins-Minerals (WOMENS MULTIVITAMIN PLUS) TABS Take 1 each by mouth daily. ALIVE NATURAL MVI.    Marland Kitchen OLANZapine (ZYPREXA) 2.5 MG tablet Take 1 tablet (2.5 mg total) by mouth at bedtime. 30 tablet 2  . SUMAtriptan (IMITREX) 50 MG tablet Take 1 tablet (50 mg total) by mouth once. May repeat in 2 hours if headache persists or recurs. 10 tablet 0  . traMADol (ULTRAM) 50 MG tablet Take 1 tablet (50 mg total) by mouth every 6 (six) hours as needed. 40 tablet 0  . amphetamine-dextroamphetamine (ADDERALL) 10 MG tablet Take 1 tablet (10 mg total) by mouth daily. 30 tablet 0   No facility-administered medications prior to visit.    No Known  Allergies  Review of Systems  Constitutional: Negative for fever and malaise/fatigue.  HENT: Negative for congestion.   Eyes: Negative for blurred vision.  Respiratory: Negative for shortness of breath.   Cardiovascular: Negative for chest pain, palpitations and leg swelling.  Gastrointestinal: Negative for nausea, abdominal pain and blood in stool.  Genitourinary: Negative for dysuria and frequency.  Musculoskeletal: Negative for falls.  Skin: Negative for rash.  Neurological: Negative for dizziness, loss of consciousness and headaches.  Endo/Heme/Allergies: Negative for environmental allergies.  Psychiatric/Behavioral: Negative for depression. The patient is not nervous/anxious.        Objective:    Physical Exam  Constitutional: She is oriented to  person, place, and time. She appears well-developed and well-nourished. No distress.  HENT:  Head: Normocephalic and atraumatic.  Eyes: Conjunctivae are normal.  Neck: Neck supple. No thyromegaly present.  Cardiovascular: Normal rate, regular rhythm and normal heart sounds.   No murmur heard. Pulmonary/Chest: Effort normal and breath sounds normal. No respiratory distress.  Abdominal: Soft. Bowel sounds are normal. She exhibits no distension and no mass. There is no tenderness.  Musculoskeletal: She exhibits no edema.  Lymphadenopathy:    She has no cervical adenopathy.  Neurological: She is alert and oriented to person, place, and time.  Skin: Skin is warm and dry.  Psychiatric: She has a normal mood and affect. Her behavior is normal.    BP 108/72 mmHg  Pulse 76  Temp(Src) 98.3 F (36.8 C) (Oral)  Ht 5\' 5"  (1.651 m)  Wt 162 lb 8 oz (73.71 kg)  BMI 27.04 kg/m2  SpO2 96% Wt Readings from Last 3 Encounters:  12/21/15 162 lb 8 oz (73.71 kg)  11/09/15 167 lb (75.751 kg)  04/13/15 163 lb 12.8 oz (74.299 kg)     Lab Results  Component Value Date   WBC 7.2 11/09/2015   HGB 12.1 11/09/2015   HCT 36.4 11/09/2015   PLT 265.0 11/09/2015   GLUCOSE 84 11/09/2015   CHOL 152 11/09/2015   TRIG 110.0 11/09/2015   HDL 46.70 11/09/2015   LDLCALC 83 11/09/2015   ALT 24 11/09/2015   AST 19 11/09/2015   NA 139 11/09/2015   K 4.2 11/09/2015   CL 104 11/09/2015   CREATININE 0.80 11/09/2015   BUN 14 11/09/2015   CO2 29 11/09/2015   TSH 5.30* 11/09/2015    Lab Results  Component Value Date   TSH 5.30* 11/09/2015   Lab Results  Component Value Date   WBC 7.2 11/09/2015   HGB 12.1 11/09/2015   HCT 36.4 11/09/2015   MCV 90.4 11/09/2015   PLT 265.0 11/09/2015   Lab Results  Component Value Date   NA 139 11/09/2015   K 4.2 11/09/2015   CO2 29 11/09/2015   GLUCOSE 84 11/09/2015   BUN 14 11/09/2015   CREATININE 0.80 11/09/2015   BILITOT 0.2 11/09/2015   ALKPHOS 36*  11/09/2015   AST 19 11/09/2015   ALT 24 11/09/2015   PROT 7.2 11/09/2015   ALBUMIN 4.3 11/09/2015   CALCIUM 9.3 11/09/2015   GFR 80.86 11/09/2015   Lab Results  Component Value Date   CHOL 152 11/09/2015   Lab Results  Component Value Date   HDL 46.70 11/09/2015   Lab Results  Component Value Date   LDLCALC 83 11/09/2015   Lab Results  Component Value Date   TRIG 110.0 11/09/2015   Lab Results  Component Value Date   CHOLHDL 3 11/09/2015   No results found for: HGBA1C  Assessment & Plan:   Problem List Items Addressed This Visit    Bipolar disorder (HCC) - Primary    Doing very well on current meds and essential oils. She is using Adderall 5 mg as needed with good results refills given      Insomnia    Encouraged good sleep hygiene such as dark, quiet room. No blue/green glowing lights such as computer screens in bedroom. No alcohol or stimulants in evening. Cut down on caffeine as able. Regular exercise is helpful but not just prior to bed time.          I have changed Ms. Liwanag's amphetamine-dextroamphetamine. I am also having her start on amphetamine-dextroamphetamine. Additionally, I am having her maintain her WOMENS MULTIVITAMIN PLUS, OLANZapine, SUMAtriptan, meclizine, HYDROcodone-acetaminophen, ALPRAZolam, traMADol, levothyroxine, and citalopram.  Meds ordered this encounter  Medications  . amphetamine-dextroamphetamine (ADDERALL) 10 MG tablet    Sig: Take 1 tablet (10 mg total) by mouth daily. April 2017    Dispense:  30 tablet    Refill:  0  . amphetamine-dextroamphetamine (ADDERALL) 10 MG tablet    Sig: Take 1 tablet (10 mg total) by mouth daily. May 2017    Dispense:  30 tablet    Refill:  0     Danise Edge, MD

## 2016-02-05 ENCOUNTER — Encounter: Payer: Self-pay | Admitting: Family Medicine

## 2016-02-08 ENCOUNTER — Other Ambulatory Visit (INDEPENDENT_AMBULATORY_CARE_PROVIDER_SITE_OTHER): Payer: Managed Care, Other (non HMO)

## 2016-02-08 ENCOUNTER — Other Ambulatory Visit: Payer: Self-pay | Admitting: Family Medicine

## 2016-02-08 DIAGNOSIS — E0590 Thyrotoxicosis, unspecified without thyrotoxic crisis or storm: Secondary | ICD-10-CM | POA: Diagnosis not present

## 2016-02-08 LAB — TSH: TSH: 3.02 u[IU]/mL (ref 0.35–4.50)

## 2016-02-08 LAB — T4, FREE: Free T4: 0.75 ng/dL (ref 0.60–1.60)

## 2016-02-08 MED ORDER — AMPHETAMINE-DEXTROAMPHETAMINE 10 MG PO TABS: 10.0000 mg | ORAL_TABLET | Freq: Every day | ORAL | Status: DC

## 2016-03-03 ENCOUNTER — Other Ambulatory Visit: Payer: Self-pay | Admitting: Family Medicine

## 2016-03-10 ENCOUNTER — Other Ambulatory Visit: Payer: Self-pay | Admitting: Family Medicine

## 2016-03-10 NOTE — Telephone Encounter (Signed)
Faxed hardcopy for Alprazolam to [facility] 

## 2016-03-13 ENCOUNTER — Other Ambulatory Visit: Payer: Self-pay | Admitting: Family Medicine

## 2016-04-29 ENCOUNTER — Encounter: Payer: Self-pay | Admitting: Nurse Practitioner

## 2016-04-29 ENCOUNTER — Ambulatory Visit (INDEPENDENT_AMBULATORY_CARE_PROVIDER_SITE_OTHER): Payer: Managed Care, Other (non HMO) | Admitting: Nurse Practitioner

## 2016-04-29 VITALS — BP 110/84 | HR 69 | Temp 98.6°F | Resp 16 | Ht 65.0 in | Wt 163.2 lb

## 2016-04-29 DIAGNOSIS — R300 Dysuria: Secondary | ICD-10-CM | POA: Diagnosis not present

## 2016-04-29 DIAGNOSIS — R350 Frequency of micturition: Secondary | ICD-10-CM

## 2016-04-29 LAB — POCT URINALYSIS DIPSTICK
Bilirubin, UA: NEGATIVE
Blood, UA: NEGATIVE
Glucose, UA: NEGATIVE
Ketones, UA: NEGATIVE
Leukocytes, UA: NEGATIVE
Nitrite, UA: NEGATIVE
Protein, UA: NEGATIVE
Spec Grav, UA: 1.01
Urobilinogen, UA: NEGATIVE
pH, UA: 6

## 2016-04-29 MED ORDER — PHENAZOPYRIDINE HCL 100 MG PO TABS: 100.0000 mg | ORAL_TABLET | Freq: Three times a day (TID) | ORAL | 0 refills | Status: DC | PRN

## 2016-04-29 MED ORDER — NITROFURANTOIN MONOHYD MACRO 100 MG PO CAPS: 100.0000 mg | ORAL_CAPSULE | Freq: Two times a day (BID) | ORAL | 0 refills | Status: AC

## 2016-04-29 MED FILL — NITROFURANTOIN MONO-MCR 100: 100 | 7 days supply | Qty: 14 | Fill #0

## 2016-04-29 NOTE — Progress Notes (Signed)
Pre visit review using our clinic review tool, if applicable. No additional management support is needed unless otherwise documented below in the visit note. 

## 2016-04-29 NOTE — Progress Notes (Signed)
Subjective:  Patient ID: Sara West, female    DOB: [birth year]  Age: 50 y.o. MRN: [id number]  CC: Urinary Frequency (Pt reports frequent urination and pinching sensation since Sunday. Has been using essential oils and cranberry juice. Has had excruciating pain the last 3 nights in lower left back and left side.)  HPI:  Urinary Frequency   This is a new problem. The current episode started in the past 7 days. The problem occurs every urination. The problem has been gradually worsening. The quality of the pain is described as aching and burning. The pain is severe. There has been no fever. She is sexually active. There is no history of pyelonephritis. Associated symptoms include frequency and urgency. Pertinent negatives include no chills, discharge, flank pain, hematuria, hesitancy, nausea, possible pregnancy, sweats or vomiting. Associated symptoms comments: Hx of tubal ligation, still has regular menstrual cycle. No constipation, no diarrhea Last BM yesterday (normal). She has tried home medications (herbal remedies and norco) for the symptoms. The treatment provided mild relief. There is no history of kidney stones, recurrent UTIs, urinary stasis or a urological procedure.    Outpatient Medications Prior to Visit  Medication Sig Dispense Refill  . ALPRAZolam (XANAX) 0.25 MG tablet TAKE 1/2 TO 2 TABLETS TWICE DAILY AS NEEDED FOR ANXIETY/INSOMNIA 90 tablet 2  . amphetamine-dextroamphetamine (ADDERALL) 10 MG tablet Take 1 tablet (10 mg total) by mouth daily. July 2017 30 tablet 0  . citalopram (CELEXA) 20 MG tablet TAKE 1 & 1/2 TABLETS BY MOUTH ONCE DAILY 45 tablet 1  . HYDROcodone-acetaminophen (NORCO) 5-325 MG tablet Take 1 tablet by mouth daily as needed for severe pain. 20 tablet 0  . Multiple Vitamins-Minerals (WOMENS MULTIVITAMIN PLUS) TABS Take 1 each by mouth daily. ALIVE NATURAL MVI.    Marland Kitchen OLANZapine (ZYPREXA) 2.5 MG tablet Take 1 tablet (2.5 mg total) by mouth at bedtime. 30 tablet 2   . amphetamine-dextroamphetamine (ADDERALL) 10 MG tablet Take 1 tablet (10 mg total) by mouth daily. May 2017 30 tablet 0  . amphetamine-dextroamphetamine (ADDERALL) 10 MG tablet Take 1 tablet (10 mg total) by mouth daily. June 2017 30 tablet 0  . levothyroxine (SYNTHROID, LEVOTHROID) 25 MCG tablet Take 1 tablet (25 mcg total) by mouth daily. (Patient not taking: Reported on 04/29/2016) 30 tablet 3  . meclizine (ANTIVERT) 25 MG tablet Take 1 tablet (25 mg total) by mouth 3 (three) times daily as needed for dizziness. (Patient not taking: Reported on 04/29/2016) 30 tablet 0  . OLANZapine (ZYPREXA) 2.5 MG tablet TAKE 1 TABLET AT BEDTIME 30 tablet 2  . SUMAtriptan (IMITREX) 50 MG tablet Take 1 tablet (50 mg total) by mouth once. May repeat in 2 hours if headache persists or recurs. (Patient not taking: Reported on 04/29/2016) 10 tablet 0  . traMADol (ULTRAM) 50 MG tablet Take 1 tablet (50 mg total) by mouth every 6 (six) hours as needed. 40 tablet 0   No facility-administered medications prior to visit.     ROS Review of Systems  Constitutional: Negative for chills.  Gastrointestinal: Negative for nausea and vomiting.  Genitourinary: Positive for frequency and urgency. Negative for flank pain, hematuria and hesitancy.    Objective:  BP 110/84   Pulse 69   Temp 98.6 F (37 C) (Oral)   Resp 16   Ht  (1.651 m)   Wt 163 lb 3.2 oz (74 kg)   LMP 03/11/2016   SpO2 98% Comment: room air  BMI 27.16 kg/m   BP  Readings from Last 3 Encounters:  04/29/16 110/84  12/21/15 108/72  11/09/15 135/77    Wt Readings from Last 3 Encounters:  04/29/16 163 lb 3.2 oz (74 kg)  12/21/15 162 lb 8 oz (73.7 kg)  11/09/15 167 lb (75.8 kg)    Physical Exam  Constitutional: She is oriented to person, place, and time. No distress.  Cardiovascular: Normal rate.   Pulmonary/Chest: Effort normal.  Abdominal: Soft. Bowel sounds are normal. She exhibits no distension. There is tenderness. There is no  rebound and no guarding.  Suprapubic tenderness  Neurological: She is alert and oriented to person, place, and time.  Skin: She is not diaphoretic.  Vitals reviewed.   Lab Results  Component Value Date   WBC 7.2 11/09/2015   HGB 12.1 11/09/2015   HCT 36.4 11/09/2015   PLT 265.0 11/09/2015   GLUCOSE 84 11/09/2015   CHOL 152 11/09/2015   TRIG 110.0 11/09/2015   HDL 46.70 11/09/2015   LDLCALC 83 11/09/2015   ALT 24 11/09/2015   AST 19 11/09/2015   NA 139 11/09/2015   K 4.2 11/09/2015   CL 104 11/09/2015   CREATININE 0.80 11/09/2015   BUN 14 11/09/2015   CO2 29 11/09/2015   TSH 3.02 02/08/2016    Mm Digital Screening Bilateral  Result Date: 05/13/2015 CLINICAL DATA:  Screening. EXAM: DIGITAL SCREENING BILATERAL MAMMOGRAM WITH CAD COMPARISON:  Previous exam(s). ACR Breast Density Category c: The breast tissue is heterogeneously dense, which may obscure small masses. FINDINGS: There are no findings suspicious for malignancy. Images were processed with CAD. IMPRESSION: No mammographic evidence of malignancy. A result letter of this screening mammogram will be mailed directly to the patient. RECOMMENDATION: Screening mammogram in one year. (Code:SM-B-01Y) BI-RADS CATEGORY  1: Negative. Electronically Signed   By: Norva PavlovElizabeth  Brown [name]   On: 05/13/2015 15:13    Assessment & Plan:   Sharman CrateMaura was seen today for urinary frequency.  Diagnoses and all orders for this visit:  Urinary frequency -     POCT Urinalysis Dipstick (CPT 81002) -     nitrofurantoin, macrocrystal-monohydrate, (MACROBID) 100 MG capsule; Take 1 capsule (100 mg total) by mouth 2 (two) times daily. -     Urine culture -     phenazopyridine (PYRIDIUM) 100 MG tablet; Take 1 tablet (100 mg total) by mouth 3 (three) times daily as needed for pain (with food).  Dysuria -     nitrofurantoin, macrocrystal-monohydrate, (MACROBID) 100 MG capsule; Take 1 capsule (100 mg total) by mouth 2 (two) times daily. -     Urine  culture -     phenazopyridine (PYRIDIUM) 100 MG tablet; Take 1 tablet (100 mg total) by mouth 3 (three) times daily as needed for pain (with food).   I have discontinued Ms. Piedra's SUMAtriptan, meclizine, traMADol, and levothyroxine. I am also having her start on nitrofurantoin (macrocrystal-monohydrate) and phenazopyridine. Additionally, I am having her maintain her WOMENS MULTIVITAMIN PLUS, OLANZapine, HYDROcodone-acetaminophen, amphetamine-dextroamphetamine, ALPRAZolam, and citalopram.  Meds ordered this encounter  Medications  . nitrofurantoin, macrocrystal-monohydrate, (MACROBID) 100 MG capsule    Sig: Take 1 capsule (100 mg total) by mouth 2 (two) times daily.    Dispense:  14 capsule    Refill:  0    Order Specific Question:   Supervising Provider    Answer:   Tresa GarterPLOTNIKOV, ALEKSEI V [[id number]]  . phenazopyridine (PYRIDIUM) 100 MG tablet    Sig: Take 1 tablet (100 mg total) by mouth 3 (three) times daily as needed for  pain (with food).    Dispense:  10 tablet    Refill:  0    Order Specific Question:   Supervising Provider    Answer:   Tresa Garter [[id number]]     Follow-up: prn  Alysia Penna, NP

## 2016-04-29 NOTE — Patient Instructions (Addendum)
Will call with urine culture results Push oral fluids Return to office if symptoms worsen or develops new symptoms.  Urinary Tract Infection A urinary tract infection (UTI) can occur any place along the urinary tract. The tract includes the kidneys, ureters, bladder, and urethra. A type of germ called bacteria often causes a UTI. UTIs are often helped with antibiotic medicine.  HOME CARE   If given, take antibiotics as told by your doctor. Finish them even if you start to feel better.  Drink enough fluids to keep your pee (urine) clear or pale yellow.  Avoid tea, drinks with caffeine, and bubbly (carbonated) drinks.  Pee often. Avoid holding your pee in for a long time.  Pee before and after having sex (intercourse).  Wipe from front to back after you poop (bowel movement) if you are a woman. Use each tissue only once. GET HELP RIGHT AWAY IF:   You have back pain.  You have lower belly (abdominal) pain.  You have chills.  You feel sick to your stomach (nauseous).  You throw up (vomit).  Your burning or discomfort with peeing does not go away.  You have a fever.  Your symptoms are not better in 3 days. MAKE SURE YOU:   Understand these instructions.  Will watch your condition.  Will get help right away if you are not doing well or get worse.   This information is not intended to replace advice given to you by your health care provider. Make sure you discuss any questions you have with your health care provider.   Document Released: 02/22/2008 Document Revised: 09/26/2014 Document Reviewed: 04/05/2012 Elsevier Interactive Patient Education [employer]2016 Elsevier Inc.

## 2016-05-02 LAB — URINE CULTURE: Colony Count: >=100000

## 2016-05-09 ENCOUNTER — Telehealth: Payer: Self-pay | Admitting: Family Medicine

## 2016-05-09 MED ORDER — CIPROFLOXACIN HCL 500 MG PO TABS: 500.0000 mg | ORAL_TABLET | Freq: Two times a day (BID) | ORAL | 0 refills | Status: DC

## 2016-05-09 NOTE — Telephone Encounter (Signed)
Relationship to patient: self Can be reached: [phone] Pharmacy: [id number] [place], [place] - [address]  Reason for call: pt was in 04/29/16 states that she was on abx for uti and the last 2 days she is having sx again. She is asking for another abx. Please advise.

## 2016-05-09 NOTE — Telephone Encounter (Signed)
Sent in antibiotic and patient informed of PCP instructions. 

## 2016-05-09 NOTE — Addendum Note (Signed)
Addended by: Scharlene GlossEWING, Stepen Prins B on: 05/09/2016 03:40 PM   Modules accepted: Orders

## 2016-05-09 NOTE — Telephone Encounter (Signed)
OK to give her Ciprofloxacin 500 mg po bid x 3 days if no improvement  Then recheck urinalysis and c and s if symptoms persist

## 2016-05-16 ENCOUNTER — Encounter: Payer: Self-pay | Admitting: Family Medicine

## 2016-05-17 ENCOUNTER — Other Ambulatory Visit: Payer: Self-pay | Admitting: Family Medicine

## 2016-05-17 MED ORDER — AMPHETAMINE-DEXTROAMPHETAMINE 10 MG PO TABS: 10.0000 mg | ORAL_TABLET | Freq: Every day | ORAL | 0 refills | Status: DC

## 2016-06-05 ENCOUNTER — Other Ambulatory Visit: Payer: Self-pay | Admitting: Family Medicine

## 2016-06-05 NOTE — Telephone Encounter (Signed)
OK to refill Citalopram for 3 months. But she does need an appt soon to keep her Alprazolam open

## 2016-06-14 ENCOUNTER — Encounter: Payer: Self-pay | Admitting: Family Medicine

## 2016-06-16 ENCOUNTER — Encounter: Payer: Self-pay | Admitting: Family Medicine

## 2016-06-16 ENCOUNTER — Other Ambulatory Visit: Payer: Self-pay | Admitting: Family Medicine

## 2016-06-16 MED ORDER — HYDROCODONE-ACETAMINOPHEN 5-325 MG PO TABS: 1.0000 | ORAL_TABLET | Freq: Every day | ORAL | 0 refills | Status: DC | PRN

## 2016-06-27 ENCOUNTER — Other Ambulatory Visit: Payer: Self-pay | Admitting: Family Medicine

## 2016-06-27 ENCOUNTER — Encounter: Payer: Self-pay | Admitting: Family Medicine

## 2016-06-27 MED ORDER — TRAMADOL HCL 50 MG PO TABS
50.0000 mg | ORAL_TABLET | Freq: Three times a day (TID) | ORAL | 0 refills | Status: DC | PRN
Start: 2016-06-27 — End: 2016-08-18

## 2016-07-15 ENCOUNTER — Other Ambulatory Visit: Payer: Self-pay | Admitting: Family Medicine

## 2016-07-15 MED ORDER — OLANZAPINE 2.5 MG PO TABS: 2.5000 mg | ORAL_TABLET | Freq: Every day | ORAL | 0 refills | Status: DC

## 2016-07-15 NOTE — Telephone Encounter (Signed)
The way I read the chart she is past the 6 month mark since her last appt. She can have a one month supply of the Zyprexa but she needs an appt

## 2016-07-18 ENCOUNTER — Other Ambulatory Visit: Payer: Self-pay | Admitting: Family Medicine

## 2016-07-18 MED ORDER — CITALOPRAM HYDROBROMIDE 20 MG PO TABS: 30.0000 mg | ORAL_TABLET | Freq: Every day | ORAL | 1 refills | Status: DC

## 2016-07-20 ENCOUNTER — Other Ambulatory Visit: Payer: Self-pay

## 2016-07-20 NOTE — Telephone Encounter (Signed)
Patient scheduled for 10/28/2016 at 1:30pm with PCP

## 2016-07-20 NOTE — Telephone Encounter (Signed)
Please schedule pt for annual exam  Thanks PC 

## 2016-08-01 ENCOUNTER — Other Ambulatory Visit: Payer: Self-pay | Admitting: Family Medicine

## 2016-08-01 MED ORDER — OLANZAPINE 2.5 MG PO TABS
2.5000 mg | ORAL_TABLET | Freq: Every day | ORAL | 0 refills | Status: DC
Start: 2016-08-01 — End: 2016-11-03

## 2016-08-08 ENCOUNTER — Other Ambulatory Visit: Payer: Self-pay | Admitting: Family Medicine

## 2016-08-08 ENCOUNTER — Encounter: Payer: Self-pay | Admitting: Family Medicine

## 2016-08-08 NOTE — Telephone Encounter (Signed)
[facility] [address] [place]

## 2016-08-09 ENCOUNTER — Other Ambulatory Visit: Payer: Self-pay | Admitting: Family Medicine

## 2016-08-09 MED ORDER — AMPHETAMINE-DEXTROAMPHETAMINE 10 MG PO TABS: 10.0000 mg | ORAL_TABLET | Freq: Every day | ORAL | 0 refills | Status: DC

## 2016-08-18 ENCOUNTER — Other Ambulatory Visit: Payer: Self-pay | Admitting: Family Medicine

## 2016-08-18 NOTE — Telephone Encounter (Signed)
Requesting:  Tramadol Contract    10/20/2014 UDS     High risk,  OVERDUE Last OV   12/21/2015 Last Refill    #30 with 0 refills on 06/27/2016  Please Advise

## 2016-08-18 NOTE — Telephone Encounter (Signed)
I have allowed a refill but since it has been greater than 6 months since seen warn her no further refills til seen, if something happens and she needs more prior to her appt she will have to come in.

## 2016-08-18 NOTE — Telephone Encounter (Signed)
[facility] [address] Pt. Informed to schedule appt.

## 2016-08-26 ENCOUNTER — Other Ambulatory Visit: Payer: Self-pay | Admitting: Family Medicine

## 2016-08-26 NOTE — Telephone Encounter (Signed)
Requesting:  Hydrocodone Contract    10/20/2014 UDS    High risk  Last OV   12/21/2015 Last Refill    #20 with 0 refills 06/16/2016  Please Advise

## 2016-08-29 MED ORDER — HYDROCODONE-ACETAMINOPHEN 5-325 MG PO TABS: 1.0000 | ORAL_TABLET | Freq: Every day | ORAL | 0 refills | Status: DC | PRN

## 2016-08-29 NOTE — Telephone Encounter (Signed)
Called left a detailed message hardcopy for hydrocodone is ready for pickup at the front desk.

## 2016-09-05 ENCOUNTER — Other Ambulatory Visit: Payer: Self-pay | Admitting: Family Medicine

## 2016-09-05 MED ORDER — AMPHETAMINE-DEXTROAMPHETAMINE 10 MG PO TABS: 10.0000 mg | ORAL_TABLET | Freq: Every day | ORAL | 0 refills | Status: DC

## 2016-09-05 NOTE — Telephone Encounter (Signed)
[facility] [address]

## 2016-09-05 NOTE — Telephone Encounter (Signed)
Requesting:  Tramadol Contract   10/20/2014 UDS    High risk--overdue Last OV     12/21/2015 Last Refill    #40 with 0 refills on 08/18/2016  Please Advise

## 2016-09-05 NOTE — Telephone Encounter (Signed)
Printed adderall for December, January and February Patient notified on [facility] ready for pickup

## 2016-10-28 ENCOUNTER — Encounter: Payer: Self-pay | Admitting: Family Medicine

## 2016-10-28 ENCOUNTER — Ambulatory Visit (INDEPENDENT_AMBULATORY_CARE_PROVIDER_SITE_OTHER): Payer: Managed Care, Other (non HMO) | Admitting: Family Medicine

## 2016-10-28 DIAGNOSIS — Z0000 Encounter for general adult medical examination without abnormal findings: Secondary | ICD-10-CM

## 2016-10-28 DIAGNOSIS — R5383 Other fatigue: Secondary | ICD-10-CM | POA: Diagnosis not present

## 2016-10-28 HISTORY — DX: Other fatigue: R53.83

## 2016-10-28 LAB — CBC
HCT: 39.4 % (ref 36.0–46.0)
Hemoglobin: 13.4 g/dL (ref 12.0–15.0)
MCHC: 34 g/dL (ref 30.0–36.0)
MCV: 91.5 fl (ref 78.0–100.0)
Platelets: 276 10*3/uL (ref 150.0–400.0)
RBC: 4.31 Mil/uL (ref 3.87–5.11)
RDW: 12.6 % (ref 11.5–15.5)
WBC: 9.8 10*3/uL (ref 4.0–10.5)

## 2016-10-28 LAB — COMPREHENSIVE METABOLIC PANEL
ALT: 40 U/L — ABNORMAL HIGH (ref 6–29)
AST: 26 U/L (ref 10–35)
Albumin: 4.6 g/dL (ref 3.6–5.1)
Alkaline Phosphatase: 48 U/L (ref 33–130)
BUN: 11 mg/dL (ref 7–25)
CO2: 24 mmol/L (ref 20–31)
Calcium: 9.5 mg/dL (ref 8.6–10.4)
Chloride: 103 mmol/L (ref 98–110)
Creat: 0.72 mg/dL (ref 0.50–1.05)
Glucose, Bld: 94 mg/dL (ref 65–99)
Potassium: 4.6 mmol/L (ref 3.5–5.3)
Sodium: 137 mmol/L (ref 135–146)
Total Bilirubin: 0.3 mg/dL (ref 0.2–1.2)
Total Protein: 7.5 g/dL (ref 6.1–8.1)

## 2016-10-28 LAB — TSH: TSH: 2.69 mIU/L

## 2016-10-28 LAB — LIPID PANEL
Cholesterol: 171 mg/dL (ref <200)
HDL: 49 mg/dL — ABNORMAL LOW (ref >50)
LDL Cholesterol: 96 mg/dL (ref <100)
Total CHOL/HDL Ratio: 3.5 Ratio (ref <5.0)
Triglycerides: 131 mg/dL (ref <150)
VLDL: 26 mg/dL (ref <30)

## 2016-10-28 MED ORDER — BUSPIRONE HCL 7.5 MG PO TABS: 7.5000 mg | ORAL_TABLET | Freq: Two times a day (BID) | ORAL | 1 refills | Status: DC | PRN

## 2016-10-28 MED ORDER — AMPHETAMINE-DEXTROAMPHETAMINE 10 MG PO TABS: 10.0000 mg | ORAL_TABLET | Freq: Every day | ORAL | 0 refills | Status: DC

## 2016-10-28 MED ORDER — RANITIDINE HCL 300 MG PO TABS: 300.0000 mg | ORAL_TABLET | Freq: Every day | ORAL | 1 refills | Status: DC

## 2016-10-28 NOTE — Progress Notes (Signed)
Patient ID: Sara West, female   DOB: 07-07-1966, 51 y.o.   MRN: [id number] .   Subjective:    Patient ID: Sara JourneyMaura A West, female    DOB: 07-07-1966, 51 y.o.   MRN: [id number]  Chief Complaint  Patient presents with  . Annual Exam    Headache   This is a chronic problem. The current episode started more than 1 year ago. The problem occurs intermittently. Pertinent negatives include no back pain, blurred vision, coughing, fever, insomnia or vomiting.    Patient is in today for an annual examination. Patient has an Hx of headaches, bipolar disorder. Psoriasis, plantar fascitis. No acute concerns notes at this time. She is doing well most days but she acknowledges she does have ongoing anxiety that waxes and wanes. No suicidal ideation but does note some anhedonia and fatigue. Denies CP/palp/SOB/HA/congestion/fevers/GI or GU c/o. Taking meds as prescribed  I acted as a Neurosurgeonscribe for Danise EdgeStacey Blyth, MD. Diamond NickelBeatrice, [place]MA   Past Medical History:  Diagnosis Date  . ANXIETY STATE NEC 05/22/2007  . Bipolar disorder, unspecified 05/22/2007   Qualifier: Diagnosis of  By: Cato MulliganSwords MD, Bruce    . Fatigue 10/28/2016  . INSOMNIA-SLEEP DISORDER-UNSPEC 12/10/2009  . Leg pain, bilateral 03/05/2013  . Plantar fasciitis, bilateral 03/05/2013  . PSORIASIS 04/24/2007  . Weight gain 01/19/2014    Past Surgical History:  Procedure Laterality Date  . CESAREAN SECTION     x2 requiring blood transfusion (BTL with 2nd one)  . CHOLECYSTECTOMY    . TONSILECTOMY, ADENOIDECTOMY, BILATERAL MYRINGOTOMY AND TUBES      Family History  Problem Relation Age of Onset  . Alcohol abuse Mother   . Cancer Mother     lung, smoker  . Arthritis Sister     walker, uses for balance issues    Social History   Social History  . Marital status: Married    Spouse name: N/A  . Number of children: N/A  . Years of education: N/A   Occupational History  . 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 Smoking  status: Former Smoker    Types: Cigarettes    Quit date: 11/19/2005  . Smokeless tobacco: Never Used  . Alcohol use Yes  . Drug use: No  . Sexual activity: Not on file   Other Topics Concern  . Not on file   Social History Narrative  . No narrative on file    Outpatient Medications Prior to Visit  Medication Sig Dispense Refill  . ALPRAZolam (XANAX) 0.25 MG tablet TAKE 1/2-2 TABLETS TWICE DAILY AS NEEDED FOR ANXIETY/INSOMNIA 90 tablet 1  . citalopram (CELEXA) 20 MG tablet Take 1.5 tablets (30 mg total) by mouth daily. 135 tablet 1  . HYDROcodone-acetaminophen (NORCO) 5-325 MG tablet Take 1 tablet by mouth daily as needed for severe pain. 20 tablet 0  . Multiple Vitamins-Minerals (WOMENS MULTIVITAMIN PLUS) TABS Take 1 each by mouth daily. ALIVE NATURAL MVI.    Marland Kitchen. OLANZapine (ZYPREXA) 2.5 MG tablet Take 1 tablet (2.5 mg total) by mouth at bedtime. (Patient taking differently: Take 2.5 mg by mouth at bedtime. Take 1/2 tab po qhs (1.25mg )) 90 tablet 0  . phenazopyridine (PYRIDIUM) 100 MG tablet Take 1 tablet (100 mg total) by mouth 3 (three) times daily as needed for pain (with food). 10 tablet 0  . traMADol (ULTRAM) 50 MG tablet TAKE 1 TABLET 3 TIMES A DAY AS NEEDED 30 tablet 0  . amphetamine-dextroamphetamine (ADDERALL) 10  MG tablet Take 1 tablet (10 mg total) by mouth daily. January 2018 30 tablet 0  . amphetamine-dextroamphetamine (ADDERALL) 10 MG tablet Take 1 tablet (10 mg total) by mouth daily. December 2017 30 tablet 0  . amphetamine-dextroamphetamine (ADDERALL) 10 MG tablet Take 1 tablet (10 mg total) by mouth daily. February 2018 30 tablet 0  . ciprofloxacin (CIPRO) 500 MG tablet Take 1 tablet (500 mg total) by mouth 2 (two) times daily. Take for 3 days 6 tablet 0   No facility-administered medications prior to visit.     No Known Allergies  Review of Systems  Constitutional: Negative for fever and malaise/fatigue.  HENT: Negative for congestion.   Eyes: Negative for blurred  vision.  Respiratory: Negative for cough and shortness of breath.   Cardiovascular: Negative for chest pain, palpitations and leg swelling.  Gastrointestinal: Negative for vomiting.  Musculoskeletal: Negative for back pain.  Skin: Negative for rash.  Neurological: Negative for loss of consciousness and headaches.  Psychiatric/Behavioral: Positive for depression. Negative for hallucinations, memory loss, substance abuse and suicidal ideas. The patient is nervous/anxious. The patient does not have insomnia.        Objective:    Physical Exam  Constitutional: She is oriented to person, place, and time. She appears well-developed and well-nourished. No distress.  HENT:  Head: Normocephalic and atraumatic.  Right Ear: External ear normal.  Left Ear: External ear normal.  Nose: Nose normal.  Mouth/Throat: Oropharynx is clear and moist.  Eyes: Conjunctivae and EOM are normal. Pupils are equal, round, and reactive to light. Right eye exhibits no discharge. Left eye exhibits no discharge.  Neck: Normal range of motion. Neck supple. No JVD present. No thyromegaly present.  Cardiovascular: Normal rate, regular rhythm, normal heart sounds and intact distal pulses.   No murmur heard. Pulmonary/Chest: Effort normal and breath sounds normal. No respiratory distress. She has no wheezes. She has no rales. She exhibits no tenderness.  Abdominal: Soft. Bowel sounds are normal. She exhibits no distension and no mass. There is no tenderness. There is no rebound and no guarding.  Musculoskeletal: Normal range of motion. She exhibits no edema or tenderness.  Lymphadenopathy:    She has no cervical adenopathy.  Neurological: She is alert and oriented to person, place, and time. She has normal reflexes. No cranial nerve deficit.  Skin: Skin is warm and dry. No rash noted. She is not diaphoretic. No erythema.  Psychiatric: She has a normal mood and affect. Her behavior is normal. Judgment and thought content  normal.  Nursing note and vitals reviewed.   BP 112/72 (BP Location: Left Arm, Patient Position: Sitting, Cuff Size: Normal)   Pulse 74   Temp 97.9 F (36.6 C) (Oral)   Ht 5\' 5"  (1.651 m)   Wt 166 lb 12.8 oz (75.7 kg)   LMP 10/10/2016 (Approximate)   SpO2 98% Comment: RA  BMI 27.76 kg/m  Wt Readings from Last 3 Encounters:  10/28/16 166 lb 12.8 oz (75.7 kg)  04/29/16 163 lb 3.2 oz (74 kg)  12/21/15 162 lb 8 oz (73.7 kg)     Lab Results  Component Value Date   WBC 9.8 10/28/2016   HGB 13.4 10/28/2016   HCT 39.4 10/28/2016   PLT 276.0 10/28/2016   GLUCOSE 94 10/28/2016   CHOL 171 10/28/2016   TRIG 131 10/28/2016   HDL 49 (L) 10/28/2016   LDLCALC 96 10/28/2016   ALT 40 (H) 10/28/2016   AST 26 10/28/2016   NA 137 10/28/2016  K 4.6 10/28/2016   CL 103 10/28/2016   CREATININE 0.72 10/28/2016   BUN 11 10/28/2016   CO2 24 10/28/2016   TSH 2.69 10/28/2016    Lab Results  Component Value Date   TSH 2.69 10/28/2016   Lab Results  Component Value Date   WBC 9.8 10/28/2016   HGB 13.4 10/28/2016   HCT 39.4 10/28/2016   MCV 91.5 10/28/2016   PLT 276.0 10/28/2016   Lab Results  Component Value Date   NA 137 10/28/2016   K 4.6 10/28/2016   CO2 24 10/28/2016   GLUCOSE 94 10/28/2016   BUN 11 10/28/2016   CREATININE 0.72 10/28/2016   BILITOT 0.3 10/28/2016   ALKPHOS 48 10/28/2016   AST 26 10/28/2016   ALT 40 (H) 10/28/2016   PROT 7.5 10/28/2016   ALBUMIN 4.6 10/28/2016   CALCIUM 9.5 10/28/2016   GFR 80.86 11/09/2015   Lab Results  Component Value Date   CHOL 171 10/28/2016   Lab Results  Component Value Date   HDL 49 (L) 10/28/2016   Lab Results  Component Value Date   LDLCALC 96 10/28/2016   Lab Results  Component Value Date   TRIG 131 10/28/2016   Lab Results  Component Value Date   CHOLHDL 3.5 10/28/2016   No results found for: HGBA1C     Assessment & Plan:   Problem List Items Addressed This Visit    Routine general medical  examination at a health care facility    Patient encouraged to maintain heart healthy diet, regular exercise, adequate sleep. Consider daily probiotics. Take medications as prescribed, referred for screening colonoscopy      Relevant Orders   Lipid panel (Completed)   Comprehensive metabolic panel (Completed)   CBC (Completed)   TSH (Completed)   Fatigue    adderall is helpful to help with energy and concentration. Labs reviewed no cause identified likely multifactorial      Relevant Orders   Lipid panel (Completed)   Comprehensive metabolic panel (Completed)   CBC (Completed)   TSH (Completed)      I have discontinued Ms. Fiddler's ciprofloxacin. I am also having her start on busPIRone and ranitidine. Additionally, I am having her maintain her WOMENS MULTIVITAMIN PLUS, phenazopyridine, citalopram, OLANZapine, ALPRAZolam, HYDROcodone-acetaminophen, traMADol, amphetamine-dextroamphetamine, amphetamine-dextroamphetamine, and amphetamine-dextroamphetamine.  Meds ordered this encounter  Medications  . DISCONTD: amphetamine-dextroamphetamine (ADDERALL) 10 MG tablet    Sig: Take 1 tablet (10 mg total) by mouth daily. March 2018    Dispense:  30 tablet    Refill:  0  . DISCONTD: amphetamine-dextroamphetamine (ADDERALL) 10 MG tablet    Sig: Take 1 tablet (10 mg total) by mouth daily. April 2018    Dispense:  30 tablet    Refill:  0  . DISCONTD: amphetamine-dextroamphetamine (ADDERALL) 10 MG tablet    Sig: Take 1 tablet (10 mg total) by mouth daily. May 2018    Dispense:  30 tablet    Refill:  0  . busPIRone (BUSPAR) 7.5 MG tablet    Sig: Take 1 tablet (7.5 mg total) by mouth 2 (two) times daily as needed.    Dispense:  60 tablet    Refill:  1  . amphetamine-dextroamphetamine (ADDERALL) 10 MG tablet    Sig: Take 1 tablet (10 mg total) by mouth daily. March 2018    Dispense:  30 tablet    Refill:  0  . amphetamine-dextroamphetamine (ADDERALL) 10 MG tablet    Sig: Take 1 tablet (10  mg  total) by mouth daily. April 2018    Dispense:  30 tablet    Refill:  0  . amphetamine-dextroamphetamine (ADDERALL) 10 MG tablet    Sig: Take 1 tablet (10 mg total) by mouth daily. May 2018    Dispense:  30 tablet    Refill:  0  . ranitidine (ZANTAC) 300 MG tablet    Sig: Take 1 tablet (300 mg total) by mouth at bedtime.    Dispense:  30 tablet    Refill:  1    CMA served as scribe during this visit. History, Physical and Plan performed by medical provider. Documentation and orders reviewed and attested to.  Danise Edge, MD

## 2016-10-28 NOTE — Assessment & Plan Note (Addendum)
adderall is helpful to help with energy and concentration. Labs reviewed no cause identified likely multifactorial

## 2016-10-28 NOTE — Assessment & Plan Note (Addendum)
Still struggles with cycling. She had a manic cycle a couple of weeks ago followed by a bad depression with suicidal ideation. Is better this week. Her cycle started 2 weeks late during this time and she has felt better since then. Has been doing yoga and exercising regularly.

## 2016-10-28 NOTE — Progress Notes (Signed)
Pre visit review using our clinic review tool, if applicable. No additional management support is needed unless otherwise documented below in the visit note. 

## 2016-10-28 NOTE — Assessment & Plan Note (Addendum)
Patient encouraged to maintain heart healthy diet, regular exercise, adequate sleep. Consider daily probiotics. Take medications as prescribed, referred for screening colonoscopy

## 2016-10-28 NOTE — Patient Instructions (Addendum)
Soak feet nightly in distilled white vinegar and hot water for 15 minutes; once dried apply either Lamisil or Vicks vapor Rub. Preventive Care 40-64 Years, Female Preventive care refers to lifestyle choices and visits with your health care provider that can promote health and wellness. What does preventive care include?  A yearly physical exam. This is also called an annual well check.  Dental exams once or twice a year.  Routine eye exams. Ask your health care provider how often you should have your eyes checked.  Personal lifestyle choices, including:  Daily care of your teeth and gums.  Regular physical activity.  Eating a healthy diet.  Avoiding tobacco and drug use.  Limiting alcohol use.  Practicing safe sex.  Taking low-dose aspirin daily starting at age 81.  Taking vitamin and mineral supplements as recommended by your health care provider. What happens during an annual well check? The services and screenings done by your health care provider during your annual well check will depend on your age, overall health, lifestyle risk factors, and family history of disease. Counseling  Your health care provider may ask you questions about your:  Alcohol use.  Tobacco use.  Drug use.  Emotional well-being.  Home and relationship well-being.  Sexual activity.  Eating habits.  Work and work Statistician.  Method of birth control.  Menstrual cycle.  Pregnancy history. Screening  You may have the following tests or measurements:  Height, weight, and BMI.  Blood pressure.  Lipid and cholesterol levels. These may be checked every 5 years, or more frequently if you are over 37 years old.  Skin check.  Lung cancer screening. You may have this screening every year starting at age 28 if you have a 30-pack-year history of smoking and currently smoke or have quit within the past 15 years.  Fecal occult blood test (FOBT) of the stool. You may have this test every  year starting at age 83.  Flexible sigmoidoscopy or colonoscopy. You may have a sigmoidoscopy every 5 years or a colonoscopy every 10 years starting at age 50.  Hepatitis C blood test.  Hepatitis B blood test.  Sexually transmitted disease (STD) testing.  Diabetes screening. This is done by checking your blood sugar (glucose) after you have not eaten for a while (fasting). You may have this done every 1-3 years.  Mammogram. This may be done every 1-2 years. Talk to your health care provider about when you should start having regular mammograms. This may depend on whether you have a family history of breast cancer.  BRCA-related cancer screening. This may be done if you have a family history of breast, ovarian, tubal, or peritoneal cancers.  Pelvic exam and Pap test. This may be done every 3 years starting at age 71. Starting at age 33, this may be done every 5 years if you have a Pap test in combination with an HPV test.  Bone density scan. This is done to screen for osteoporosis. You may have this scan if you are at high risk for osteoporosis. Discuss your test results, treatment options, and if necessary, the need for more tests with your health care provider. Vaccines  Your health care provider may recommend certain vaccines, such as:  Influenza vaccine. This is recommended every year.  Tetanus, diphtheria, and acellular pertussis (Tdap, Td) vaccine. You may need a Td booster every 10 years.  Varicella vaccine. You may need this if you have not been vaccinated.  Zoster vaccine. You may need this after  age 60.  Measles, mumps, and rubella (MMR) vaccine. You may need at least one dose of MMR if you were born in 1957 or later. You may also need a second dose.  Pneumococcal 13-valent conjugate (PCV13) vaccine. You may need this if you have certain conditions and were not previously vaccinated.  Pneumococcal polysaccharide (PPSV23) vaccine. You may need one or two doses if you smoke  cigarettes or if you have certain conditions.  Meningococcal vaccine. You may need this if you have certain conditions.  Hepatitis A vaccine. You may need this if you have certain conditions or if you travel or work in places where you may be exposed to hepatitis A.  Hepatitis B vaccine. You may need this if you have certain conditions or if you travel or work in places where you may be exposed to hepatitis B.  Haemophilus influenzae type b (Hib) vaccine. You may need this if you have certain conditions. Talk to your health care provider about which screenings and vaccines you need and how often you need them. This information is not intended to replace advice given to you by your health care provider. Make sure you discuss any questions you have with your health care provider. Document Released: 10/02/2015 Document Revised: 05/25/2016 Document Reviewed: 07/07/2015 Elsevier Interactive Patient Education  2017 [employer].

## 2016-11-02 ENCOUNTER — Encounter: Payer: Self-pay | Admitting: Family Medicine

## 2016-11-03 ENCOUNTER — Other Ambulatory Visit: Payer: Self-pay | Admitting: Family Medicine

## 2016-12-21 ENCOUNTER — Encounter: Payer: Self-pay | Admitting: Family Medicine

## 2016-12-22 NOTE — Telephone Encounter (Signed)
Can this wait until Dr Abner Greenspan gets back or does she need it now ?

## 2016-12-25 ENCOUNTER — Other Ambulatory Visit: Payer: Self-pay | Admitting: Family Medicine

## 2016-12-26 MED ORDER — HYDROCODONE-ACETAMINOPHEN 5-325 MG PO TABS: 1.0000 | ORAL_TABLET | Freq: Every day | ORAL | 0 refills | Status: DC | PRN

## 2016-12-26 NOTE — Telephone Encounter (Signed)
Requesting:   alprazolam Contract   Signed on 10/20/2014 UDS  High risk on 11/18/14 Last OV    10/28/16 Last Refill   #90 with 1 refill on 08/08/16  Please Advise

## 2016-12-26 NOTE — Telephone Encounter (Signed)
[facility] [address]

## 2017-01-21 ENCOUNTER — Other Ambulatory Visit: Payer: Self-pay | Admitting: Family Medicine

## 2017-01-24 ENCOUNTER — Encounter: Payer: Self-pay | Admitting: Family Medicine

## 2017-01-30 ENCOUNTER — Telehealth: Payer: Self-pay | Admitting: Family Medicine

## 2017-01-30 MED ORDER — AMPHETAMINE-DEXTROAMPHETAMINE 10 MG PO TABS: 10.0000 mg | ORAL_TABLET | Freq: Every day | ORAL | 0 refills | Status: DC

## 2017-01-30 NOTE — Telephone Encounter (Signed)
Pt stopped in to see if 90 day script was ready for pick up. Informed pt she can wait a few minutes for provider to come out of the room. She opted to wait until her next appt 02/06/17 pt states she has 6 days supply left.

## 2017-02-06 ENCOUNTER — Ambulatory Visit (INDEPENDENT_AMBULATORY_CARE_PROVIDER_SITE_OTHER): Payer: Managed Care, Other (non HMO) | Admitting: Family Medicine

## 2017-02-06 ENCOUNTER — Encounter: Payer: Self-pay | Admitting: Family Medicine

## 2017-02-06 VITALS — BP 112/66 | HR 81 | Temp 98.5°F | Wt 168.0 lb

## 2017-02-06 DIAGNOSIS — R945 Abnormal results of liver function studies: Secondary | ICD-10-CM | POA: Diagnosis not present

## 2017-02-06 DIAGNOSIS — R7989 Other specified abnormal findings of blood chemistry: Secondary | ICD-10-CM

## 2017-02-06 DIAGNOSIS — F3170 Bipolar disorder, currently in remission, most recent episode unspecified: Secondary | ICD-10-CM

## 2017-02-06 DIAGNOSIS — M79604 Pain in right leg: Secondary | ICD-10-CM | POA: Diagnosis not present

## 2017-02-06 DIAGNOSIS — R635 Abnormal weight gain: Secondary | ICD-10-CM | POA: Diagnosis not present

## 2017-02-06 DIAGNOSIS — R946 Abnormal results of thyroid function studies: Secondary | ICD-10-CM

## 2017-02-06 DIAGNOSIS — E785 Hyperlipidemia, unspecified: Secondary | ICD-10-CM | POA: Diagnosis not present

## 2017-02-06 DIAGNOSIS — G4700 Insomnia, unspecified: Secondary | ICD-10-CM

## 2017-02-06 DIAGNOSIS — M79605 Pain in left leg: Secondary | ICD-10-CM

## 2017-02-06 DIAGNOSIS — R5383 Other fatigue: Secondary | ICD-10-CM | POA: Diagnosis not present

## 2017-02-06 HISTORY — DX: Abnormal results of liver function studies: R94.5

## 2017-02-06 HISTORY — DX: Other specified abnormal findings of blood chemistry: R79.89

## 2017-02-06 MED ORDER — TIZANIDINE HCL 4 MG PO TABS: 4.0000 mg | ORAL_TABLET | Freq: Four times a day (QID) | ORAL | 3 refills | Status: DC | PRN

## 2017-02-06 MED ORDER — CITALOPRAM HYDROBROMIDE 20 MG PO TABS: 20.0000 mg | ORAL_TABLET | Freq: Every day | ORAL | 1 refills | Status: DC

## 2017-02-06 NOTE — Progress Notes (Signed)
Patient ID: Sara West, female   DOB: Apr 22, 1966, 51 y.o.   MRN: [id number]   Subjective:  I acted as a Neurosurgeoncribe for Danise EdgeStacey Jakhai Fant, MD. Diamond NickelBeatrice, [place]MA   Patient ID: Sara West, female    DOB: Apr 22, 1966, 51 y.o.   MRN: [id number]  Chief Complaint  Patient presents with  . Medication F/U    HPI  Patient is in today for a follow up on medications. Patient wishes to discuss a few medications with the Provider. Patient is also complaining of an ongoing concern with fatigue. Patient has a Hx of fatigue, psoriasis, insomnia, bipolar disorder. Patient has recently a very physically demanding job and taken a pay cut. It has helped her pain and stress level but because her husband is self employed she continues to struggle some with financial stress. No recent febrile illness or hospitalizations. Endorses mild anhedonia but it is improving. Denies CP/palp/SOB/HA/congestion/fevers/GI or GU c/o. Taking meds as prescribed  Patient Care Team: Bradd CanaryBlyth, Christifer Chapdelaine A, MD as PCP - General (Family Medicine) Noland FordyceFogleman, Kelly, MD as Consulting Physician (Obstetrics and Gynecology)   Past Medical History:  Diagnosis Date  . Abnormal liver function test 02/06/2017  . Abnormal thyroid blood test 02/06/2017  . ANXIETY STATE NEC 05/22/2007  . Bipolar disorder, unspecified (HCC) 05/22/2007   Qualifier: Diagnosis of  By: Cato MulliganSwords MD, Bruce    . Fatigue 10/28/2016  . INSOMNIA-SLEEP DISORDER-UNSPEC 12/10/2009  . Leg pain, bilateral 03/05/2013  . Plantar fasciitis, bilateral 03/05/2013  . PSORIASIS 04/24/2007  . Weight gain 01/19/2014    Past Surgical History:  Procedure Laterality Date  . CESAREAN SECTION     x2 requiring blood transfusion (BTL with 2nd one)  . CHOLECYSTECTOMY    . TONSILECTOMY, ADENOIDECTOMY, BILATERAL MYRINGOTOMY AND TUBES      Family History  Problem Relation Age of Onset  . Alcohol abuse Mother   . Cancer Mother        lung, smoker  . Arthritis Sister        walker, uses for balance issues     Social History   Social History  . Marital status: Married    Spouse name: N/A  . Number of children: N/A  . Years of education: N/A   Occupational History  . 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 Smoking status: Former Smoker    Types: Cigarettes    Quit date: 11/19/2005  . Smokeless tobacco: Never Used  . Alcohol use Yes  . Drug use: No  . Sexual activity: Not on file   Other Topics Concern  . Not on file   Social History Narrative  . No narrative on file    Outpatient Medications Prior to Visit  Medication Sig Dispense Refill  . ALPRAZolam (XANAX) 0.25 MG tablet TAKE 1/2 TO 2 TABLETS TWICE A DAY AS NEEDED FOR ANXIETY/INSOMNIA 90 tablet 1  . amphetamine-dextroamphetamine (ADDERALL) 10 MG tablet Take 1 tablet (10 mg total) by mouth daily. June 2018 30 tablet 0  . amphetamine-dextroamphetamine (ADDERALL) 10 MG tablet Take 1 tablet (10 mg total) by mouth daily. August 2018 30 tablet 0  . amphetamine-dextroamphetamine (ADDERALL) 10 MG tablet Take 1 tablet (10 mg total) by mouth daily. July  2018 30 tablet 0  . busPIRone (BUSPAR) 7.5 MG tablet Take 1 tablet (7.5 mg total) by mouth 2 (two) times daily as needed. 60 tablet 1  . HYDROcodone-acetaminophen (NORCO) 5-325 MG tablet Take 1 tablet by mouth daily as  needed for severe pain. 20 tablet 0  . Multiple Vitamins-Minerals (WOMENS MULTIVITAMIN PLUS) TABS Take 1 each by mouth daily. ALIVE NATURAL MVI.    Marland Kitchen OLANZapine (ZYPREXA) 2.5 MG tablet TAKE 1 TABLET (2.5 MG TOTAL) BY MOUTH AT BEDTIME. 90 tablet 0  . phenazopyridine (PYRIDIUM) 100 MG tablet Take 1 tablet (100 mg total) by mouth 3 (three) times daily as needed for pain (with food). 10 tablet 0  . ranitidine (ZANTAC) 300 MG tablet Take 1 tablet (300 mg total) by mouth at bedtime. 30 tablet 1  . traMADol (ULTRAM) 50 MG tablet TAKE 1 TABLET 3 TIMES A DAY AS NEEDED 30 tablet 0  . citalopram (CELEXA) 20 MG tablet TAKE 1&1/2 TABLETS (30 MG TOTAL) BY  MOUTH DAILY. 135 tablet 1   No facility-administered medications prior to visit.     No Known Allergies  Review of Systems  Constitutional: Positive for malaise/fatigue. Negative for fever.  HENT: Negative for congestion.   Eyes: Negative for blurred vision.  Respiratory: Negative for cough and shortness of breath.   Cardiovascular: Negative for chest pain, palpitations and leg swelling.  Gastrointestinal: Negative for vomiting.  Musculoskeletal: Negative for back pain.  Skin: Negative for rash.  Neurological: Negative for loss of consciousness and headaches.  Psychiatric/Behavioral: Positive for depression. The patient is nervous/anxious and has insomnia.        Objective:    Physical Exam  Constitutional: She is oriented to person, place, and time. She appears well-developed and well-nourished. No distress.  HENT:  Head: Normocephalic and atraumatic.  Eyes: Conjunctivae are normal.  Neck: Normal range of motion. No thyromegaly present.  Cardiovascular: Normal rate and regular rhythm.   Pulmonary/Chest: Effort normal and breath sounds normal. She has no wheezes.  Abdominal: Soft. Bowel sounds are normal. There is no tenderness.  Musculoskeletal: She exhibits no edema or deformity.  Neurological: She is alert and oriented to person, place, and time.  Skin: Skin is warm and dry. She is not diaphoretic.  Psychiatric: She has a normal mood and affect.    BP 112/66 (BP Location: Left Arm, Patient Position: Sitting, Cuff Size: Normal)   Pulse 81   Temp 98.5 F (36.9 C) (Oral)   Wt 168 lb (76.2 kg)   SpO2 98% Comment: RA  BMI 27.96 kg/m  Wt Readings from Last 3 Encounters:  02/06/17 168 lb (76.2 kg)  10/28/16 166 lb 12.8 oz (75.7 kg)  04/29/16 163 lb 3.2 oz (74 kg)   BP Readings from Last 3 Encounters:  02/06/17 112/66  10/28/16 112/72  04/29/16 110/84     Immunization History  Administered Date(s) Administered  . Influenza Split 08/03/2011, 08/10/2012  .  Influenza Whole 06/28/2013  . Td 12/10/2009    Health Maintenance  Topic Date Due  . HIV Screening  05/03/1981  . COLONOSCOPY  05/03/2016  . INFLUENZA VACCINE  04/19/2017  . MAMMOGRAM  05/12/2017  . PAP SMEAR  07/21/2019  . TETANUS/TDAP  12/11/2019    Lab Results  Component Value Date   WBC 9.8 10/28/2016   HGB 13.4 10/28/2016   HCT 39.4 10/28/2016   PLT 276.0 10/28/2016   GLUCOSE 94 10/28/2016   CHOL 171 10/28/2016   TRIG 131 10/28/2016   HDL 49 (L) 10/28/2016   LDLCALC 96 10/28/2016   ALT 40 (H) 10/28/2016   AST 26 10/28/2016   NA 137 10/28/2016   K 4.6 10/28/2016   CL 103 10/28/2016   CREATININE 0.72 10/28/2016   BUN 11 10/28/2016  CO2 24 10/28/2016   TSH 2.69 10/28/2016    Lab Results  Component Value Date   TSH 2.69 10/28/2016   Lab Results  Component Value Date   WBC 9.8 10/28/2016   HGB 13.4 10/28/2016   HCT 39.4 10/28/2016   MCV 91.5 10/28/2016   PLT 276.0 10/28/2016   Lab Results  Component Value Date   NA 137 10/28/2016   K 4.6 10/28/2016   CO2 24 10/28/2016   GLUCOSE 94 10/28/2016   BUN 11 10/28/2016   CREATININE 0.72 10/28/2016   BILITOT 0.3 10/28/2016   ALKPHOS 48 10/28/2016   AST 26 10/28/2016   ALT 40 (H) 10/28/2016   PROT 7.5 10/28/2016   ALBUMIN 4.6 10/28/2016   CALCIUM 9.5 10/28/2016   GFR 80.86 11/09/2015   Lab Results  Component Value Date   CHOL 171 10/28/2016   Lab Results  Component Value Date   HDL 49 (L) 10/28/2016   Lab Results  Component Value Date   LDLCALC 96 10/28/2016   Lab Results  Component Value Date   TRIG 131 10/28/2016   Lab Results  Component Value Date   CHOLHDL 3.5 10/28/2016   No results found for: HGBA1C       Assessment & Plan:   Problem List Items Addressed This Visit    Bipolar disorder (HCC)    Doing well on Olanzapine 2.5 and then is trying to reduce the Citalopram from 1.5 tabs to 1 tab daily. She will report any new concerns as she starts her new job      Insomnia     Encouraged good sleep hygiene such as dark, quiet room. No blue/green glowing lights such as computer screens in bedroom. No alcohol or stimulants in evening. Cut down on caffeine as able. Regular exercise is helpful but not just prior to bed time.       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 Did not respond well to steroid shot in hip in past with sports med. She describes her hips aching every day. Encouraged to use Lidocaine patches and given Tizanidine 4 mg po qhs prn. With new job she is hoping she will need less pain meds over time      Relevant Orders   CBC   Weight gain    Encouraged DASH diet, decrease po intake and increase exercise as tolerated. Needs 7-8 hours of sleep nightly. Avoid trans fats, eat small, frequent meals every 4-5 hours with lean proteins, complex carbs and healthy fats. Minimize simple carbs      Fatigue    Struggles with chronic fatigue secondary to her anxiety and hectic pace.       Abnormal liver function test   Relevant Orders   Comprehensive metabolic panel   Abnormal thyroid blood test   Relevant Orders   TSH    Other Visit Diagnoses    Hyperlipidemia, unspecified hyperlipidemia type    -  Primary   Relevant Orders   Lipid panel      I have changed Ms. Rumsey's citalopram. I am also having her start on tiZANidine. Additionally, I am having her maintain her WOMENS MULTIVITAMIN PLUS, phenazopyridine, traMADol, busPIRone, ranitidine, OLANZapine, ALPRAZolam, HYDROcodone-acetaminophen, amphetamine-dextroamphetamine, amphetamine-dextroamphetamine, and amphetamine-dextroamphetamine.  Meds ordered this encounter  Medications  . tiZANidine (ZANAFLEX) 4 MG tablet    Sig: Take 1 tablet (4 mg total) by mouth every 6 (six) hours as needed for muscle  spasms (qhs pr).    Dispense:  30 tablet    Refill:  3  . citalopram (CELEXA) 20 MG tablet    Sig: Take 1 tablet (20 mg total) by mouth daily.     Dispense:  90 tablet    Refill:  1    CMA served as Neurosurgeon during this visit. History, Physical and Plan performed by medical provider. Documentation and orders reviewed and attested to.  Danise Edge, MD

## 2017-02-06 NOTE — Assessment & Plan Note (Addendum)
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 Did not respond well to steroid shot in hip in past with sports med. She describes her hips aching every day. Encouraged to use Lidocaine patches and given Tizanidine 4 mg po qhs prn. With new job she is hoping she will need less pain meds over time

## 2017-02-06 NOTE — Patient Instructions (Signed)
Lidocaine patches aspercreme, icy hot or salon pas Bursitis Bursitis is when the fluid-filled sac (bursa) that covers and protects a joint is swollen (inflamed). Bursitis is most common near joints, especially the knees, elbows, hips, and shoulders. Follow these instructions at home:  Take medicines only as told by your doctor.  If you were prescribed an antibiotic medicine, finish it all even if you start to feel better.  Rest the affected area as told by your doctor.  Keep the area raised up.  Avoid doing things that make the pain worse.  Apply ice to the injured area:  Place ice in a plastic bag.  Place a towel between your skin and the bag.  Leave the ice on for 20 minutes, 2-3 times a day.  Use splints, braces, pads, or walking aids as told by your doctor.  Keep all follow-up visits as told by your doctor. This is important. Contact a doctor if:  You have more pain with home care.  You have a fever.  You have chills. This information is not intended to replace advice given to you by your health care provider. Make sure you discuss any questions you have with your health care provider. Document Released: 02/23/2010 Document Revised: 02/11/2016 Document Reviewed: 11/25/2013 Elsevier Interactive Patient Education  2017 [employer]

## 2017-02-06 NOTE — Assessment & Plan Note (Addendum)
Doing well on Olanzapine 2.5 and then is trying to reduce the Citalopram from 1.5 tabs to 1 tab daily. She will report any new concerns as she starts her new job

## 2017-02-06 NOTE — Assessment & Plan Note (Signed)
Struggles with chronic fatigue secondary to her anxiety and hectic pace.

## 2017-02-06 NOTE — Assessment & Plan Note (Addendum)
Encouraged DASH diet, decrease po intake and increase exercise as tolerated. Needs 7-8 hours of sleep nightly. Avoid trans fats, eat small, frequent meals every 4-5 hours with lean proteins, complex carbs and healthy fats. Minimize simple carbs 

## 2017-02-06 NOTE — Progress Notes (Signed)
Pre visit review using our clinic review tool, if applicable. No additional management support is needed unless otherwise documented below in the visit note. 

## 2017-02-07 LAB — COMPREHENSIVE METABOLIC PANEL
ALT: 38 U/L — ABNORMAL HIGH (ref 0–35)
AST: 24 U/L (ref 0–37)
Albumin: 4.3 g/dL (ref 3.5–5.2)
Alkaline Phosphatase: 40 U/L (ref 39–117)
BUN: 13 mg/dL (ref 6–23)
CO2: 28 mEq/L (ref 19–32)
Calcium: 9.3 mg/dL (ref 8.4–10.5)
Chloride: 103 mEq/L (ref 96–112)
Creatinine, Ser: 0.73 mg/dL (ref 0.40–1.20)
GFR: 89.41 mL/min (ref >60.00)
Glucose, Bld: 104 mg/dL — ABNORMAL HIGH (ref 70–99)
Potassium: 4 mEq/L (ref 3.5–5.1)
Sodium: 138 mEq/L (ref 135–145)
Total Bilirubin: 0.2 mg/dL (ref 0.2–1.2)
Total Protein: 6.9 g/dL (ref 6.0–8.3)

## 2017-02-07 LAB — LIPID PANEL
Cholesterol: 152 mg/dL (ref 0–200)
HDL: 44.2 mg/dL (ref >39.00)
LDL Cholesterol: 78 mg/dL (ref 0–99)
NonHDL: 107.66
Total CHOL/HDL Ratio: 3
Triglycerides: 148 mg/dL (ref 0.0–149.0)
VLDL: 29.6 mg/dL (ref 0.0–40.0)

## 2017-02-07 LAB — CBC
HCT: 36 % (ref 36.0–46.0)
Hemoglobin: 12 g/dL (ref 12.0–15.0)
MCHC: 33.5 g/dL (ref 30.0–36.0)
MCV: 91.6 fl (ref 78.0–100.0)
Platelets: 260 10*3/uL (ref 150.0–400.0)
RBC: 3.93 Mil/uL (ref 3.87–5.11)
RDW: 13 % (ref 11.5–15.5)
WBC: 8.8 10*3/uL (ref 4.0–10.5)

## 2017-02-07 LAB — TSH: TSH: 2.29 u[IU]/mL (ref 0.35–4.50)

## 2017-02-07 NOTE — Assessment & Plan Note (Signed)
Encouraged good sleep hygiene such as dark, quiet room. No blue/green glowing lights such as computer screens in bedroom. No alcohol or stimulants in evening. Cut down on caffeine as able. Regular exercise is helpful but not just prior to bed time.  

## 2017-02-18 ENCOUNTER — Other Ambulatory Visit: Payer: Self-pay | Admitting: Family Medicine

## 2017-04-27 ENCOUNTER — Telehealth: Payer: Self-pay | Admitting: Family Medicine

## 2017-04-27 MED ORDER — ALPRAZOLAM 0.25 MG PO TABS: ORAL_TABLET | ORAL | 1 refills | Status: DC

## 2017-04-27 NOTE — Telephone Encounter (Signed)
Faxed hardcopy for ALprazolam to [facility] on [address]

## 2017-04-27 NOTE — Telephone Encounter (Signed)
Requesting:   alprazolam Contract     10/20/2016 UDS     High risk last done on 11/18/2014 Last OV     5/21/208 Last Refill  #90 with 1 refill on 12/26/2016  Please Advise  [facility] [address] fax number is [phone]

## 2017-04-27 NOTE — Telephone Encounter (Signed)
OK to refill requested med 

## 2017-05-14 ENCOUNTER — Other Ambulatory Visit: Payer: Self-pay | Admitting: Family Medicine

## 2017-05-18 ENCOUNTER — Other Ambulatory Visit: Payer: Self-pay | Admitting: Family Medicine

## 2017-05-18 MED ORDER — HYDROCODONE-ACETAMINOPHEN 5-325 MG PO TABS: 1.0000 | ORAL_TABLET | Freq: Every day | ORAL | 0 refills | Status: DC | PRN

## 2017-05-18 NOTE — Telephone Encounter (Signed)
Created Contract/patient is aware of need for this and UDS when she P/U Hydrocodone-APAP Rx/thx dmf

## 2017-07-03 ENCOUNTER — Telehealth: Payer: Self-pay | Admitting: Family Medicine

## 2017-07-05 ENCOUNTER — Encounter: Payer: Self-pay | Admitting: Family Medicine

## 2017-07-06 MED ORDER — AMPHETAMINE-DEXTROAMPHETAMINE 10 MG PO TABS: 10.0000 mg | ORAL_TABLET | Freq: Every day | ORAL | 0 refills | Status: DC

## 2017-07-06 NOTE — Telephone Encounter (Signed)
Pt is requesting refill on Adderall 10mg .   Last OV: 02/06/2017 Last Fill: 01/30/2017 #30 and 0RF (For June, July and August 2018) UDS: 10/20/2014 High Risk  Please advise.

## 2017-07-06 NOTE — Telephone Encounter (Signed)
She can have an October and November refills til seen. November will be six months since seen. She will need an appt

## 2017-07-06 NOTE — Telephone Encounter (Signed)
rx printed left voicemail and also sent mychart message letting her know her rx was ready for pickup and she needs to schedule an appointment in November.

## 2017-07-25 ENCOUNTER — Ambulatory Visit: Payer: Managed Care, Other (non HMO) | Admitting: Family Medicine

## 2017-07-27 ENCOUNTER — Ambulatory Visit: Payer: Commercial Managed Care - PPO | Admitting: Family Medicine

## 2017-07-27 ENCOUNTER — Encounter: Payer: Self-pay | Admitting: Family Medicine

## 2017-07-27 VITALS — BP 100/62 | HR 75 | Temp 98.2°F | Resp 18 | Wt 163.2 lb

## 2017-07-27 DIAGNOSIS — Z79899 Other long term (current) drug therapy: Secondary | ICD-10-CM | POA: Diagnosis not present

## 2017-07-27 DIAGNOSIS — F3170 Bipolar disorder, currently in remission, most recent episode unspecified: Secondary | ICD-10-CM

## 2017-07-27 DIAGNOSIS — R635 Abnormal weight gain: Secondary | ICD-10-CM

## 2017-07-27 DIAGNOSIS — R110 Nausea: Secondary | ICD-10-CM

## 2017-07-27 DIAGNOSIS — R0789 Other chest pain: Secondary | ICD-10-CM | POA: Diagnosis not present

## 2017-07-27 DIAGNOSIS — R6881 Early satiety: Secondary | ICD-10-CM

## 2017-07-27 DIAGNOSIS — R002 Palpitations: Secondary | ICD-10-CM

## 2017-07-27 DIAGNOSIS — R739 Hyperglycemia, unspecified: Secondary | ICD-10-CM | POA: Insufficient documentation

## 2017-07-27 DIAGNOSIS — I10 Essential (primary) hypertension: Secondary | ICD-10-CM | POA: Diagnosis not present

## 2017-07-27 DIAGNOSIS — R42 Dizziness and giddiness: Secondary | ICD-10-CM | POA: Diagnosis not present

## 2017-07-27 DIAGNOSIS — K047 Periapical abscess without sinus: Secondary | ICD-10-CM | POA: Diagnosis not present

## 2017-07-27 DIAGNOSIS — R7989 Other specified abnormal findings of blood chemistry: Secondary | ICD-10-CM | POA: Diagnosis not present

## 2017-07-27 DIAGNOSIS — R945 Abnormal results of liver function studies: Secondary | ICD-10-CM | POA: Diagnosis not present

## 2017-07-27 DIAGNOSIS — R519 Headache, unspecified: Secondary | ICD-10-CM

## 2017-07-27 DIAGNOSIS — R51 Headache: Secondary | ICD-10-CM

## 2017-07-27 DIAGNOSIS — E1129 Type 2 diabetes mellitus with other diabetic kidney complication: Secondary | ICD-10-CM | POA: Insufficient documentation

## 2017-07-27 LAB — LIPID PANEL
Cholesterol: 142 mg/dL (ref 0–200)
HDL: 48.6 mg/dL (ref >39.00)
LDL Cholesterol: 78 mg/dL (ref 0–99)
NonHDL: 92.97
Total CHOL/HDL Ratio: 3
Triglycerides: 77 mg/dL (ref 0.0–149.0)
VLDL: 15.4 mg/dL (ref 0.0–40.0)

## 2017-07-27 LAB — CBC WITH DIFFERENTIAL/PLATELET
Basophils Absolute: 0 10*3/uL (ref 0.0–0.1)
Basophils Relative: 0.3 % (ref 0.0–3.0)
Eosinophils Absolute: 0 10*3/uL (ref 0.0–0.7)
Eosinophils Relative: 0.6 % (ref 0.0–5.0)
HCT: 40.1 % (ref 36.0–46.0)
Hemoglobin: 13.2 g/dL (ref 12.0–15.0)
Lymphocytes Relative: 17.7 % (ref 12.0–46.0)
Lymphs Abs: 1.4 10*3/uL (ref 0.7–4.0)
MCHC: 32.9 g/dL (ref 30.0–36.0)
MCV: 93.2 fl (ref 78.0–100.0)
Monocytes Absolute: 0.4 10*3/uL (ref 0.1–1.0)
Monocytes Relative: 5.6 % (ref 3.0–12.0)
Neutro Abs: 5.8 10*3/uL (ref 1.4–7.7)
Neutrophils Relative %: 75.8 % (ref 43.0–77.0)
Platelets: 257 10*3/uL (ref 150.0–400.0)
RBC: 4.3 Mil/uL (ref 3.87–5.11)
RDW: 12.8 % (ref 11.5–15.5)
WBC: 7.6 10*3/uL (ref 4.0–10.5)

## 2017-07-27 LAB — COMPREHENSIVE METABOLIC PANEL
ALT: 97 U/L — ABNORMAL HIGH (ref 0–35)
AST: 39 U/L — ABNORMAL HIGH (ref 0–37)
Albumin: 4.1 g/dL (ref 3.5–5.2)
Alkaline Phosphatase: 55 U/L (ref 39–117)
BUN: 14 mg/dL (ref 6–23)
CO2: 29 mEq/L (ref 19–32)
Calcium: 9.4 mg/dL (ref 8.4–10.5)
Chloride: 101 mEq/L (ref 96–112)
Creatinine, Ser: 0.77 mg/dL (ref 0.40–1.20)
GFR: 83.92 mL/min (ref >60.00)
Glucose, Bld: 92 mg/dL (ref 70–99)
Potassium: 4.7 mEq/L (ref 3.5–5.1)
Sodium: 136 mEq/L (ref 135–145)
Total Bilirubin: 0.3 mg/dL (ref 0.2–1.2)
Total Protein: 7.1 g/dL (ref 6.0–8.3)

## 2017-07-27 LAB — HEMOGLOBIN A1C: Hgb A1c MFr Bld: 6 % (ref 4.6–6.5)

## 2017-07-27 LAB — TSH: TSH: 1.68 u[IU]/mL (ref 0.35–4.50)

## 2017-07-27 LAB — SEDIMENTATION RATE: Sed Rate: 17 mm/hr (ref 0–30)

## 2017-07-27 LAB — H. PYLORI ANTIBODY, IGG: H Pylori IgG: NEGATIVE

## 2017-07-27 MED ORDER — ASPIRIN EC 81 MG PO TBEC: 81.0000 mg | DELAYED_RELEASE_TABLET | Freq: Every day | ORAL | Status: DC

## 2017-07-27 MED ORDER — NITROGLYCERIN 0.4 MG SL SUBL: 0.4000 mg | SUBLINGUAL_TABLET | SUBLINGUAL | 1 refills | Status: DC | PRN

## 2017-07-27 NOTE — Assessment & Plan Note (Addendum)
Check cmp today which showed they have increased some. Will repeat cmp with acute hepatis panel and consider abdominal ultrasound if worsens

## 2017-07-27 NOTE — Patient Instructions (Signed)

## 2017-07-27 NOTE — Assessment & Plan Note (Addendum)
Daily, feels unstable, headache, sometimes feels woozy and unsteady and other times she has a spinning sensation. Feels self spinning and then anxiety kicks. Denies any recent febrile illnes

## 2017-07-27 NOTE — Assessment & Plan Note (Signed)
Check thyroid studies  today

## 2017-07-27 NOTE — Assessment & Plan Note (Signed)
Happening intermittently, last minutes to hours, no associated symptoms, none this week

## 2017-07-27 NOTE — Assessment & Plan Note (Signed)
2 nights ago with radiation to left arm, palpitations and some SOB. EKG today, started on ECASA and NTG and referred to cardiology for further consideration

## 2017-07-27 NOTE — Progress Notes (Signed)
Subjective:  I acted as a Neurosurgeon for Dr. Abner Greenspan. Sara West, Sara West  Patient ID: Sara West, female    DOB: 15-Dec-1965, 51 y.o.   MRN: [id number]  No chief complaint on file.   HPI  Patient is in today for a medication follow up and she is feeling poorly today. She notes she has been struggling with dental pain, headaches, myalgias and malaise and an infection. Has just been started on amoxicillin and is awaiting surgery. She stopped Olanzapine due to weight gain and has lost weight since stopping. No new depression concerns but has increased palpitatins and anxiety. Denies CPSOB/HA/congestion/fevers/GI or GU c/o. Taking meds as prescribed  Patient Care Team: Bradd Canary, MD as PCP - General (Family Medicine) Noland Fordyce, MD as Consulting Physician (Obstetrics and Gynecology)   Past Medical History:  Diagnosis Date  . Abnormal liver function test 02/06/2017  . Abnormal thyroid blood test 02/06/2017  . ANXIETY STATE NEC 05/22/2007  . Bipolar disorder, unspecified (HCC) 05/22/2007   Qualifier: Diagnosis of  By: Cato Mulligan MD, Bruce    . Fatigue 10/28/2016  . INSOMNIA-SLEEP DISORDER-UNSPEC 12/10/2009  . Leg pain, bilateral 03/05/2013  . Plantar fasciitis, bilateral 03/05/2013  . PSORIASIS 04/24/2007  . Weight gain 01/19/2014    Past Surgical History:  Procedure Laterality Date  . CESAREAN SECTION     x2 requiring blood transfusion (BTL with 2nd one)  . CHOLECYSTECTOMY    . TONSILECTOMY, ADENOIDECTOMY, BILATERAL MYRINGOTOMY AND TUBES      Family History  Problem Relation Age of Onset  . Alcohol abuse Mother   . Cancer Mother        lung, smoker  . Arthritis Sister        walker, uses for balance issues    Social History   Socioeconomic History  . Marital status: Married    Spouse name: Not on file  . Number of children: Not on file  . Years of education: Not on file  . Highest education level: Not on file  Social Needs  . Financial resource strain: Not on file  . Food  insecurity - worry: Not on file  . Food insecurity - inability: Not on file  . Transportation needs - medical: Not on file  . Transportation needs - non-medical: Not on file  Occupational History  . Occupation: free lance for [employer]  Tobacco Use  . Smoking status: Former Smoker    Types: Cigarettes    Last attempt to quit: 11/19/2005    Years since quitting: 11.7  . Smokeless tobacco: Never Used  Substance and Sexual Activity  . Alcohol use: Yes  . Drug use: No  . Sexual activity: Not on file  Other Topics Concern  . Not on file  Social History Narrative  . Not on file    Outpatient Medications Prior to Visit  Medication Sig Dispense Refill  . ALPRAZolam (XANAX) 0.25 MG tablet TAKE 1/2 TO 2 TABLETS TWICE A DAY AS NEEDED FOR ANXIETY/INSOMNIA 90 tablet 1  . amphetamine-dextroamphetamine (ADDERALL) 10 MG tablet Take 1 tablet (10 mg total) by mouth daily. July  2018 30 tablet 0  . amphetamine-dextroamphetamine (ADDERALL) 10 MG tablet Take 1 tablet (10 mg total) by mouth daily. October 2018 30 tablet 0  . amphetamine-dextroamphetamine (ADDERALL) 10 MG tablet Take 1 tablet (10 mg total) by mouth daily. November 2018 30 tablet 0  . busPIRone (BUSPAR) 7.5 MG tablet TAKE 1 TABLET (7.5 MG TOTAL) BY MOUTH 2 (TWO) TIMES DAILY AS  NEEDED. 60 tablet 1  . citalopram (CELEXA) 20 MG tablet Take 1 tablet (20 mg total) by mouth daily. 90 tablet 1  . HYDROcodone-acetaminophen (NORCO) 5-325 MG tablet Take 1 tablet by mouth daily as needed for severe pain. (Patient not taking: Reported on 07/27/2017) 20 tablet 0  . Multiple Vitamins-Minerals (WOMENS MULTIVITAMIN PLUS) TABS Take 1 each by mouth daily. ALIVE NATURAL MVI.    Marland Kitchen. OLANZapine (ZYPREXA) 2.5 MG tablet Take 1 tablet (2.5 mg total) by mouth at bedtime. (Patient not taking: Reported on 07/27/2017) 90 tablet 0  . phenazopyridine (PYRIDIUM) 100 MG tablet Take 1 tablet (100 mg total) by mouth 3 (three) times daily as needed for pain (with  food). (Patient not taking: Reported on 07/27/2017) 10 tablet 0  . ranitidine (ZANTAC) 300 MG tablet Take 1 tablet (300 mg total) by mouth at bedtime. (Patient not taking: Reported on 07/27/2017) 30 tablet 1  . tiZANidine (ZANAFLEX) 4 MG tablet Take 1 tablet (4 mg total) by mouth every 6 (six) hours as needed for muscle spasms (qhs pr). (Patient not taking: Reported on 07/27/2017) 30 tablet 3  . traMADol (ULTRAM) 50 MG tablet TAKE 1 TABLET 3 TIMES A DAY AS NEEDED (Patient not taking: Reported on 07/27/2017) 30 tablet 0   No facility-administered medications prior to visit.     No Known Allergies  Review of Systems  Constitutional: Positive for malaise/fatigue. Negative for fever.  HENT: Negative for congestion.   Eyes: Negative for blurred vision.  Respiratory: Negative for shortness of breath.   Cardiovascular: Positive for palpitations. Negative for chest pain and leg swelling.  Gastrointestinal: Negative for abdominal pain, blood in stool and nausea.  Genitourinary: Negative for dysuria and frequency.  Musculoskeletal: Positive for myalgias. Negative for falls.  Skin: Negative for rash.  Neurological: Positive for headaches. Negative for dizziness and loss of consciousness.  Endo/Heme/Allergies: Negative for environmental allergies.  Psychiatric/Behavioral: Negative for depression. The patient is not nervous/anxious.        Objective:    Physical Exam  Constitutional: She is oriented to person, place, and time. She appears well-developed and well-nourished. No distress.  HENT:  Head: Normocephalic and atraumatic.  Nose: Nose normal.  Eyes: Right eye exhibits no discharge. Left eye exhibits no discharge.  Neck: Normal range of motion. Neck supple.  Cardiovascular: Normal rate and regular rhythm.  No murmur heard. Pulmonary/Chest: Effort normal and breath sounds normal.  Abdominal: Soft. Bowel sounds are normal. There is no tenderness.  Musculoskeletal: She exhibits no edema.    Neurological: She is alert and oriented to person, place, and time.  Skin: Skin is warm and dry.  Psychiatric: She has a normal mood and affect.  Nursing note and vitals reviewed.   BP 100/62 (BP Location: Left Arm, Patient Position: Sitting, Cuff Size: Normal)   Pulse 75   Temp 98.2 F (36.8 C) (Oral)   Resp 18   Wt 163 lb 3.2 oz (74 kg)   SpO2 98%   BMI 27.16 kg/m  Wt Readings from Last 3 Encounters:  07/27/17 163 lb 3.2 oz (74 kg)  02/06/17 168 lb (76.2 kg)  10/28/16 166 lb 12.8 oz (75.7 kg)   BP Readings from Last 3 Encounters:  07/27/17 100/62  02/06/17 112/66  10/28/16 112/72     Immunization History  Administered Date(s) Administered  . Influenza Split 08/03/2011, 08/10/2012  . Influenza Whole 06/28/2013  . Td 12/10/2009    Health Maintenance  Topic Date Due  . HIV Screening  05/03/1981  .  COLONOSCOPY  05/03/2016  . MAMMOGRAM  05/12/2017  . PAP SMEAR  07/21/2019  . TETANUS/TDAP  12/11/2019  . INFLUENZA VACCINE  Discontinued    Lab Results  Component Value Date   WBC 7.6 07/27/2017   HGB 13.2 07/27/2017   HCT 40.1 07/27/2017   PLT 257.0 07/27/2017   GLUCOSE 92 07/27/2017   CHOL 142 07/27/2017   TRIG 77.0 07/27/2017   HDL 48.60 07/27/2017   LDLCALC 78 07/27/2017   ALT 97 (H) 07/27/2017   AST 39 (H) 07/27/2017   NA 136 07/27/2017   K 4.7 07/27/2017   CL 101 07/27/2017   CREATININE 0.77 07/27/2017   BUN 14 07/27/2017   CO2 29 07/27/2017   TSH 1.68 07/27/2017   HGBA1C 6.0 07/27/2017    Lab Results  Component Value Date   TSH 1.68 07/27/2017   Lab Results  Component Value Date   WBC 7.6 07/27/2017   HGB 13.2 07/27/2017   HCT 40.1 07/27/2017   MCV 93.2 07/27/2017   PLT 257.0 07/27/2017   Lab Results  Component Value Date   NA 136 07/27/2017   K 4.7 07/27/2017   CO2 29 07/27/2017   GLUCOSE 92 07/27/2017   BUN 14 07/27/2017   CREATININE 0.77 07/27/2017   BILITOT 0.3 07/27/2017   ALKPHOS 55 07/27/2017   AST 39 (H) 07/27/2017   ALT  97 (H) 07/27/2017   PROT 7.1 07/27/2017   ALBUMIN 4.1 07/27/2017   CALCIUM 9.4 07/27/2017   GFR 83.92 07/27/2017   Lab Results  Component Value Date   CHOL 142 07/27/2017   Lab Results  Component Value Date   HDL 48.60 07/27/2017   Lab Results  Component Value Date   LDLCALC 78 07/27/2017   Lab Results  Component Value Date   TRIG 77.0 07/27/2017   Lab Results  Component Value Date   CHOLHDL 3 07/27/2017   Lab Results  Component Value Date   HGBA1C 6.0 07/27/2017         Assessment & Plan:   Problem List Items Addressed This Visit    Bipolar disorder (HCC)    Stopped her Olanzapine due to weight gain and she declines to restart or start another mood stabilizer at this time.       Palpitations    Happening intermittently, last minutes to hours, no associated symptoms, none this week      Relevant Orders   Comprehensive metabolic panel (Completed)   TSH (Completed)   Weight gain    She quit her Zyprexa due to weight gain and has lost some weight since then.       Severe headache    Encouraged increased hydration, 64 ounces of clear fluids daily. Minimize alcohol and caffeine. Eat small frequent meals with lean proteins and complex carbs. Avoid high and low blood sugars. Get adequate sleep, 7-8 hours a night. Needs exercise daily preferably in the morning.      Relevant Medications   aspirin EC 81 MG tablet   Abnormal liver function test    Check cmp today which showed they have increased some. Will repeat cmp with acute hepatis panel and consider abdominal ultrasound if worsens      Relevant Orders   Comprehensive metabolic panel   Abnormal thyroid blood test    Check thyroid studies  today      Relevant Orders   TSH (Completed)   Dizziness    Daily, feels unstable, headache, sometimes feels woozy and unsteady and other times  she has a spinning sensation. Feels self spinning and then anxiety kicks. Denies any recent febrile illnes      Relevant  Orders   TSH (Completed)   H. pylori antibody, IgG (Completed)   Dental infection    On Amoxicillin for dental infection started yesterday is awaiting further procedures. This is likely contributing to her feeling poorly. Will notify us if she does not start feeling better once treated.       Relevant Orders   CBC with Differential/Platelet (Completed)   Sedimentation rate (Completed)   High blood pressure    Notes 138/110 while having chest pain and palpitations 2 nights. Better in office today      Relevant Medications   nitroGLYCERIN (NITROSTAT) 0.4 MG SL tablet   aspirin EC 81 MG tablet   Other Relevant Orders   Lipid panel (Completed)   Atypical chest pain    2 nights ago with radiation to left arm, palpitations and some SOB. EKG today, started on ECASA and NTG and referred to cardiology for further consideration      Relevant Orders   Lipid panel (Completed)   EKG 12-Lead (Completed)   Sedimentation rate (Completed)   Ambulatory referral to Cardiology   Hyperglycemia   Relevant Orders   Hemoglobin A1c (Completed)   Nausea   Relevant Orders   H. pylori antibody, IgG (Completed)    Other Visit Diagnoses    High risk medication use    -  Primary   Relevant Orders   Pain Mgmt, Profile 8 w/Conf, U   H. pylori antibody, IgG (Completed)   Early satiety       Relevant Orders   H. pylori antibody, IgG (Completed)      I have discontinued Kharlie A. Iracheta's phenazopyridine, traMADol, ranitidine, and tiZANidine. I am also having her start on nitroGLYCERIN and aspirin EC. Additionally, I am having her maintain her WOMENS MULTIVITAMIN PLUS, amphetamine-dextroamphetamine, citalopram, busPIRone, ALPRAZolam, OLANZapine, HYDROcodone-acetaminophen, amphetamine-dextroamphetamine, and amphetamine-dextroamphetamine.  Meds ordered this encounter  Medications  . nitroGLYCERIN (NITROSTAT) 0.4 MG SL tablet    Sig: Place 1 tablet (0.4 mg total) every 5 (five) minutes as needed under the  tongue for chest pain.    Dispense:  25 tablet    Refill:  1  . aspirin EC 81 MG tablet    Sig: Take 1 tablet (81 mg total) daily by mouth.    CMA served as Neurosurgeonscribe during this visit. History, Physical and Plan performed by medical provider. Documentation and orders reviewed and attested to.  Danise EdgeStacey Bailey Faiella, MD

## 2017-07-27 NOTE — Assessment & Plan Note (Addendum)
On Amoxicillin for dental infection started yesterday is awaiting further procedures. This is likely contributing to her feeling poorly. Will notify us if she does not start feeling better once treated.

## 2017-07-27 NOTE — Assessment & Plan Note (Signed)
Notes 138/110 while having chest pain and palpitations 2 nights. Better in office today

## 2017-07-28 ENCOUNTER — Encounter: Payer: Self-pay | Admitting: Family Medicine

## 2017-07-31 DIAGNOSIS — R110 Nausea: Secondary | ICD-10-CM | POA: Insufficient documentation

## 2017-07-31 NOTE — Assessment & Plan Note (Signed)
She quit her Zyprexa due to weight gain and has lost some weight since then.

## 2017-07-31 NOTE — Assessment & Plan Note (Signed)
Encouraged increased hydration, 64 ounces of clear fluids daily. Minimize alcohol and caffeine. Eat small frequent meals with lean proteins and complex carbs. Avoid high and low blood sugars. Get adequate sleep, 7-8 hours a night. Needs exercise daily preferably in the morning.  

## 2017-07-31 NOTE — Assessment & Plan Note (Addendum)
Stopped her Olanzapine due to weight gain and she declines to restart or start another mood stabilizer at this time.

## 2017-08-02 LAB — PAIN MGMT, PROFILE 8 W/CONF, U
6 Acetylmorphine: NEGATIVE ng/mL (ref <10)
Alcohol Metabolites: NEGATIVE ng/mL (ref <500)
Alphahydroxyalprazolam: NEGATIVE ng/mL (ref <25)
Alphahydroxymidazolam: NEGATIVE ng/mL (ref <50)
Alphahydroxytriazolam: NEGATIVE ng/mL (ref <50)
Aminoclonazepam: NEGATIVE ng/mL (ref <25)
Amphetamine: 528 ng/mL — ABNORMAL HIGH (ref <250)
Amphetamines: POSITIVE ng/mL — AB (ref <500)
Benzodiazepines: NEGATIVE ng/mL (ref <100)
Buprenorphine, Urine: NEGATIVE ng/mL (ref <5)
Cocaine Metabolite: NEGATIVE ng/mL (ref <150)
Creatinine: 89 mg/dL
Hydroxyethylflurazepam: NEGATIVE ng/mL (ref <50)
Lorazepam: NEGATIVE ng/mL (ref <50)
MDMA: NEGATIVE ng/mL (ref <500)
Marijuana Metabolite: 10 ng/mL — ABNORMAL HIGH (ref <5)
Marijuana Metabolite: POSITIVE ng/mL — AB (ref <20)
Methamphetamine: NEGATIVE ng/mL (ref <250)
Nordiazepam: NEGATIVE ng/mL (ref <50)
Oxazepam: NEGATIVE ng/mL (ref <50)
Oxidant: NEGATIVE ug/mL (ref <200)
Oxycodone: NEGATIVE ng/mL (ref <100)
Temazepam: NEGATIVE ng/mL (ref <50)
pH: 7.74 (ref 4.5–9.0)

## 2017-08-04 ENCOUNTER — Other Ambulatory Visit: Payer: Self-pay | Admitting: Family Medicine

## 2017-08-15 NOTE — Progress Notes (Signed)
Cardiology Office Note   Date:  08/18/2017   ID:  Sara West, DOB April 24, 1966, MRN [id number]  PCP:  Mosie Lukes, MD  Cardiologist:   Jenkins Rouge, MD   No chief complaint on file.     History of Present Illness: Sara West is a 51 y.o. female who presents for consultation regarding chest pain. Referred by Penni Homans Reviewed her office note from 07/27/17 She is a former smoker quitting in 2007. She has bipolar disease, anxiety / depression  And is on celexa and adderall . Some benign sounding palpitations lasting minutes to hours No associated symptoms Dental infection Rx with amoxacillin has finished procedures and feels better 07/25/17 SSCP radiated to left arm and some dyspnea  Given ASA and SL nitro ECG reviewed 07/27/17 normal No history of vascular or CAD. A1c 6.0 TSH normal LDL 78 mildly elevated LFT;s Drug screen positive for marijuana. ESR and WBC normal   She indicates having VT ablation in her 20's x 2 ath [facility] in  [place]. Works in Pharmacologist Infrequent palpitations  Denies drugs to me   Takes adderall for OCD she does not think she is bipolar    Past Medical History:  Diagnosis Date  . Abnormal liver function test 02/06/2017  . Abnormal thyroid blood test 02/06/2017  . ANXIETY STATE NEC 05/22/2007  . Bipolar disorder, unspecified ([facility]) 05/22/2007   Qualifier: Diagnosis of  By: Leanne Chang MD, Bruce    . Fatigue 10/28/2016  . INSOMNIA-SLEEP DISORDER-UNSPEC 12/10/2009  . Leg pain, bilateral 03/05/2013  . Plantar fasciitis, bilateral 03/05/2013  . PSORIASIS 04/24/2007  . Weight gain 01/19/2014    Past Surgical History:  Procedure Laterality Date  . CESAREAN SECTION     x2 requiring blood transfusion (BTL with 2nd one)  . CHOLECYSTECTOMY    . TONSILECTOMY, ADENOIDECTOMY, BILATERAL MYRINGOTOMY AND TUBES       Current Outpatient Medications  Medication Sig Dispense Refill  . ALPRAZolam (XANAX) 0.25 MG tablet TAKE 1/2 TO 2 TABLETS  TWICE A DAY AS NEEDED FOR ANXIETY/INSOMNIA 90 tablet 1  . amphetamine-dextroamphetamine (ADDERALL) 10 MG tablet Take 1 tablet (10 mg total) by mouth daily. July  2018 30 tablet 0  . amphetamine-dextroamphetamine (ADDERALL) 10 MG tablet Take 1 tablet (10 mg total) by mouth daily. October 2018 30 tablet 0  . amphetamine-dextroamphetamine (ADDERALL) 10 MG tablet Take 1 tablet (10 mg total) by mouth daily. November 2018 (Patient taking differently: Take 5 mg by mouth daily. November 2018) 30 tablet 0  . busPIRone (BUSPAR) 7.5 MG tablet TAKE 1 TABLET (7.5 MG TOTAL) BY MOUTH 2 (TWO) TIMES DAILY AS NEEDED. 60 tablet 1  . citalopram (CELEXA) 20 MG tablet Take 1 tablet (20 mg total) by mouth daily. 90 tablet 1  . Multiple Vitamins-Minerals (WOMENS MULTIVITAMIN PLUS) TABS Take 1 each by mouth daily. ALIVE NATURAL MVI.    Marland Kitchen aspirin EC 81 MG tablet Take 1 tablet (81 mg total) daily by mouth. (Patient not taking: Reported on 08/18/2017)    . citalopram (CELEXA) 20 MG tablet TAKE 1&1/2 TABLETS (30 MG TOTAL) BY MOUTH DAILY. (Patient not taking: Reported on 08/18/2017) 135 tablet 1  . HYDROcodone-acetaminophen (NORCO) 5-325 MG tablet Take 1 tablet by mouth daily as needed for severe pain. (Patient not taking: Reported on 07/27/2017) 20 tablet 0  . nitroGLYCERIN (NITROSTAT) 0.4 MG SL tablet Place 1 tablet (0.4 mg total) every 5 (five) minutes as needed under the tongue for chest  pain. (Patient not taking: Reported on 08/18/2017) 25 tablet 1  . OLANZapine (ZYPREXA) 2.5 MG tablet Take 1 tablet (2.5 mg total) by mouth at bedtime. (Patient not taking: Reported on 07/27/2017) 90 tablet 0   No current facility-administered medications for this visit.     Allergies:   Patient has no known allergies.    Social History:  The patient  reports that she quit smoking about 11 years ago. Her smoking use included cigarettes. she has never used smokeless tobacco. She reports that she drinks alcohol. She reports that she does not  use drugs.   Family History:  The patient's family history includes Alcohol abuse in her mother; Arthritis in her sister; Cancer in her mother.    ROS:  Please see the history of present illness.   Otherwise, review of systems are positive for none.   All other systems are reviewed and negative.    PHYSICAL EXAM: VS:  BP 130/82 (BP Location: Left Arm, Patient Position: Sitting, Cuff Size: Normal)   Pulse 90   Ht 5' 4"  (1.626 m)   Wt 164 lb (74.4 kg)   BMI 28.15 kg/m  , BMI Body mass index is 28.15 kg/m. Affect appropriate Healthy:  appears stated age 26: normal Neck supple with no adenopathy JVP normal no bruits no thyromegaly Lungs clear with no wheezing and good diaphragmatic motion Heart:  S1/S2 no murmur, no rub, gallop or click PMI normal Abdomen: benighn, BS positve, no tenderness, no AAA no bruit.  No HSM or HJR Distal pulses intact with no bruits No edema Neuro non-focal Skin warm and dry No muscular weakness    EKG: 07/27/17 NSR rate 78 normal    Recent Labs: 07/27/2017: ALT 97; BUN 14; Creatinine, Ser 0.77; Hemoglobin 13.2; Platelets 257.0; Potassium 4.7; Sodium 136; TSH 1.68    Lipid Panel    Component Value Date/Time   CHOL 142 07/27/2017 1056   TRIG 77.0 07/27/2017 1056   HDL 48.60 07/27/2017 1056   CHOLHDL 3 07/27/2017 1056   VLDL 15.4 07/27/2017 1056   LDLCALC 78 07/27/2017 1056      Wt Readings from Last 3 Encounters:  08/18/17 164 lb (74.4 kg)  07/27/17 163 lb 3.2 oz (74 kg)  02/06/17 168 lb (76.2 kg)      Other studies Reviewed: Additional studies/ records that were reviewed today include: Notes primary 07/27/17 ECG labs .    ASSESSMENT AND PLAN:  1.  Chest Pain atypical likely panic attack f/u ETT 2. Palpitations benign given history of VT ablation will check echo and see if any exercise Induced arrhythmias on ETT 3. Abnormal LFTls may not be forthright about drug use  F/u primary  4. Drug use see above  5. Psychiatric denies  bipolar intolerant to some meds suggested she try non stimulant meds for OCD 6. Dental improved infection cleared no need for SBE   Current medicines are reviewed at length with the patient today.  The patient does not have concerns regarding medicines.  The following changes have been made:  no change  Labs/ tests ordered today include: ETT Echo   Orders Placed This Encounter  Procedures  . EXERCISE TOLERANCE TEST (ETT)  . ECHOCARDIOGRAM COMPLETE     Disposition:   FU with cardiology PRN      Signed, Jenkins Rouge, MD  08/18/2017 10:08 AM    [facility] [address], [place], [place]  [address] Phone: [phone]; Fax: [phone]

## 2017-08-18 ENCOUNTER — Ambulatory Visit: Payer: Commercial Managed Care - PPO | Admitting: Cardiovascular Disease

## 2017-08-18 VITALS — BP 130/82 | HR 90 | Ht 64.0 in | Wt 164.0 lb

## 2017-08-18 DIAGNOSIS — R002 Palpitations: Secondary | ICD-10-CM

## 2017-08-18 DIAGNOSIS — R079 Chest pain, unspecified: Secondary | ICD-10-CM

## 2017-08-18 DIAGNOSIS — I472 Ventricular tachycardia, unspecified: Secondary | ICD-10-CM

## 2017-08-18 NOTE — Patient Instructions (Addendum)
Medication Instructions:  Your physician recommends that you continue on your current medications as directed. Please refer to the Current Medication list given to you today.  Labwork: NONE  Testing/Procedures: Your physician has requested that you have an exercise tolerance test. For further information please visit [email or web address]. Please also follow instruction sheet, as given.  Your physician has requested that you have an echocardiogram. Echocardiography is a painless test that uses sound waves to create images of your heart. It provides your doctor with information about the size and shape of your heart and how well your heart's chambers and valves are working. This procedure takes approximately one hour. There are no restrictions for this procedure.  Follow-Up: Your physician wants you to follow-up in: 12 months with Dr. Eden EmmsNishan. You will receive a reminder letter in the mail two months in advance. If you don't receive a letter, please call our office to schedule the follow-up appointment.   If you need a refill on your cardiac medications before your next appointment, please call your pharmacy.

## 2017-09-22 ENCOUNTER — Other Ambulatory Visit: Payer: Self-pay | Admitting: Family Medicine

## 2017-09-22 ENCOUNTER — Encounter: Payer: Self-pay | Admitting: Family Medicine

## 2017-09-25 ENCOUNTER — Encounter: Payer: Self-pay | Admitting: Family Medicine

## 2017-09-25 ENCOUNTER — Other Ambulatory Visit: Payer: Self-pay | Admitting: Family Medicine

## 2017-09-25 MED ORDER — AMPHETAMINE-DEXTROAMPHETAMINE 10 MG PO TABS: 10.0000 mg | ORAL_TABLET | Freq: Every day | ORAL | 0 refills | Status: DC

## 2017-09-25 NOTE — Telephone Encounter (Signed)
Please advise?  Last adderall RX: 07/06/17 for October and November Last OV:  07/27/17 Next OV: due 09/26/17 but has not scheduled UDS: 07/27/17, moderate and needs to be clear at repeat in 3 months (10/27/17) CSC:  09/29/14 and needs to be updated CSR: No discrepancies identified. Last filled 08/18/17, #30

## 2017-09-26 ENCOUNTER — Other Ambulatory Visit: Payer: Self-pay | Admitting: Family Medicine

## 2017-09-26 ENCOUNTER — Other Ambulatory Visit: Payer: Self-pay

## 2017-09-26 DIAGNOSIS — Z79899 Other long term (current) drug therapy: Secondary | ICD-10-CM

## 2017-09-26 MED ORDER — AMPHETAMINE-DEXTROAMPHETAMINE 10 MG PO TABS: 10.0000 mg | ORAL_TABLET | Freq: Every day | ORAL | 0 refills | Status: DC

## 2017-10-09 ENCOUNTER — Telehealth: Payer: Self-pay | Admitting: Family Medicine

## 2017-10-09 ENCOUNTER — Other Ambulatory Visit: Payer: Commercial Managed Care - PPO

## 2017-10-09 DIAGNOSIS — Z79899 Other long term (current) drug therapy: Secondary | ICD-10-CM

## 2017-10-09 NOTE — Telephone Encounter (Signed)
Copied from CRM [id number]. Topic: Quick Communication - See Telephone Encounter >> Oct 09, 2017 12:10 PM Valentina LucksMatos, Jackelin wrote: CRM for notification. See Telephone encounter for:  10/09/17.  Pt never picked up rx for Hydrocodone-acetaminophen 5-325 with Aug 30,2018 date (amount 20 tablets). (rx shredded)

## 2017-10-10 LAB — PAIN MGMT, PROFILE 8 W/CONF, U
6 Acetylmorphine: NEGATIVE ng/mL (ref <10)
Alcohol Metabolites: NEGATIVE ng/mL (ref <500)
Amphetamines: NEGATIVE ng/mL (ref <500)
Benzodiazepines: NEGATIVE ng/mL (ref <100)
Buprenorphine, Urine: NEGATIVE ng/mL (ref <5)
Cocaine Metabolite: NEGATIVE ng/mL (ref <150)
Creatinine: 37.2 mg/dL
MDMA: NEGATIVE ng/mL (ref <500)
Marijuana Metabolite: NEGATIVE ng/mL (ref <20)
Opiates: NEGATIVE ng/mL (ref <100)
Oxidant: NEGATIVE ug/mL (ref <200)
Oxycodone: NEGATIVE ng/mL (ref <100)
pH: 6.47 (ref 4.5–9.0)

## 2017-10-17 ENCOUNTER — Ambulatory Visit (HOSPITAL_COMMUNITY): Payer: Commercial Managed Care - PPO | Attending: Cardiovascular Disease

## 2017-10-17 ENCOUNTER — Other Ambulatory Visit: Payer: Self-pay

## 2017-10-17 DIAGNOSIS — Z87891 Personal history of nicotine dependence: Secondary | ICD-10-CM | POA: Insufficient documentation

## 2017-10-17 DIAGNOSIS — I472 Ventricular tachycardia, unspecified: Secondary | ICD-10-CM

## 2017-10-17 DIAGNOSIS — R002 Palpitations: Secondary | ICD-10-CM | POA: Diagnosis not present

## 2017-10-17 DIAGNOSIS — R079 Chest pain, unspecified: Secondary | ICD-10-CM

## 2017-11-05 ENCOUNTER — Other Ambulatory Visit: Payer: Self-pay | Admitting: Family Medicine

## 2017-11-06 NOTE — Telephone Encounter (Signed)
Requesting: XANAX 0.25 MG Contract: 07/27/17 UDS: 10/09/17 LOW RISK Last OV: 07/27/17 Next OV: - Last Refill: 04/27/17  #90   Please advise

## 2017-11-21 ENCOUNTER — Encounter: Payer: Self-pay | Admitting: Family Medicine

## 2017-12-18 ENCOUNTER — Other Ambulatory Visit: Payer: Self-pay | Admitting: Family Medicine

## 2017-12-18 ENCOUNTER — Encounter: Payer: Self-pay | Admitting: Family Medicine

## 2017-12-18 MED ORDER — HYDROCODONE-ACETAMINOPHEN 5-325 MG PO TABS: 1.0000 | ORAL_TABLET | Freq: Every day | ORAL | 0 refills | Status: DC | PRN

## 2018-01-22 ENCOUNTER — Other Ambulatory Visit: Payer: Self-pay | Admitting: Family Medicine

## 2018-01-23 ENCOUNTER — Other Ambulatory Visit: Payer: Self-pay | Admitting: Family Medicine

## 2018-01-23 MED ORDER — AMPHETAMINE-DEXTROAMPHETAMINE 10 MG PO TABS: 10.0000 mg | ORAL_TABLET | Freq: Every day | ORAL | 0 refills | Status: DC

## 2018-01-23 NOTE — Telephone Encounter (Signed)
I have authorized a refill but she needs an appt to be seen for any further refills due to it has been more than 6 months since seen

## 2018-01-23 NOTE — Telephone Encounter (Signed)
Requesting:Adderall Contract:07/27/17 UDS:10/09/17 LOW RISK Last Visit:07/27/17 Next Visit: none with pcp Last Refill:09/25/17 3 prescriptions  No descrepancies Please Advise

## 2018-01-24 NOTE — Telephone Encounter (Signed)
Author phoned pt. regarding Dr. Mariel Aloe adderrall refill, and request to be seen in the next month for routine UDS. Author made appointment for 6/13 at 4PM. Pt. appreciative.

## 2018-02-21 ENCOUNTER — Other Ambulatory Visit: Payer: Self-pay | Admitting: Family Medicine

## 2018-02-21 NOTE — Telephone Encounter (Signed)
Requesting:XANAX 0.25 MG Contract: 07/27/17 UDS:07/27/17 Last OV:07/27/17 Next OV:03/06/18 Last Refill:11/06/17   Please advise

## 2018-03-01 ENCOUNTER — Ambulatory Visit: Payer: Commercial Managed Care - PPO | Admitting: Family Medicine

## 2018-03-06 ENCOUNTER — Ambulatory Visit: Payer: Commercial Managed Care - PPO | Admitting: Family Medicine

## 2018-03-27 ENCOUNTER — Encounter: Payer: Self-pay | Admitting: Family Medicine

## 2018-03-27 ENCOUNTER — Ambulatory Visit: Payer: Commercial Managed Care - PPO | Admitting: Family Medicine

## 2018-03-27 VITALS — BP 132/73 | HR 62 | Temp 97.9°F | Resp 18 | Wt 162.4 lb

## 2018-03-27 DIAGNOSIS — R635 Abnormal weight gain: Secondary | ICD-10-CM | POA: Diagnosis not present

## 2018-03-27 DIAGNOSIS — R42 Dizziness and giddiness: Secondary | ICD-10-CM

## 2018-03-27 DIAGNOSIS — Z79899 Other long term (current) drug therapy: Secondary | ICD-10-CM

## 2018-03-27 DIAGNOSIS — F319 Bipolar disorder, unspecified: Secondary | ICD-10-CM | POA: Diagnosis not present

## 2018-03-27 DIAGNOSIS — R002 Palpitations: Secondary | ICD-10-CM

## 2018-03-27 DIAGNOSIS — N289 Disorder of kidney and ureter, unspecified: Secondary | ICD-10-CM

## 2018-03-27 DIAGNOSIS — M25551 Pain in right hip: Secondary | ICD-10-CM

## 2018-03-27 DIAGNOSIS — R5383 Other fatigue: Secondary | ICD-10-CM

## 2018-03-27 DIAGNOSIS — R739 Hyperglycemia, unspecified: Secondary | ICD-10-CM

## 2018-03-27 NOTE — Patient Instructions (Addendum)
Lidocaine patches Chiropractor Dr Jewel Baizearcy Ward or Dr Christell FaithSalama  Hip Pain The hip is the joint between the upper legs and the lower pelvis. The bones, cartilage, tendons, and muscles of your hip joint support your body and allow you to move around. Hip pain can range from a minor ache to severe pain in one or both of your hips. The pain may be felt on the inside of the hip joint near the groin, or the outside near the buttocks and upper thigh. You may also have swelling or stiffness. Follow these instructions at home: Managing pain, stiffness, and swelling  If directed, apply ice to the injured area. ? Put ice in a plastic bag. ? Place a towel between your skin and the bag. ? Leave the ice on for 20 minutes, 2-3 times a day  Sleep with a pillow between your legs on your most comfortable side.  Avoid any activities that cause pain. General instructions  Take over-the-counter and prescription medicines only as told by your health care provider.  Do any exercises as told by your health care provider.  Record the following: ? How often you have hip pain. ? The location of your pain. ? What the pain feels like. ? What makes the pain worse.  Keep all follow-up visits as told by your health care provider. This is important. Contact a health care provider if:  You cannot put weight on your leg.  Your pain or swelling continues or gets worse after one week.  It gets harder to walk.  You have a fever. Get help right away if:  You fall.  You have a sudden increase in pain and swelling in your hip.  Your hip is red or swollen or very tender to touch. Summary  Hip pain can range from a minor ache to severe pain in one or both of your hips.  The pain may be felt on the inside of the hip joint near the groin, or the outside near the buttocks and upper thigh.  Avoid any activities that cause pain.  Record how often you have hip pain, the location of the pain, what makes it worse and  what it feels like. This information is not intended to replace advice given to you by your health care provider. Make sure you discuss any questions you have with your health care provider. Document Released: 02/23/2010 Document Revised: 08/08/2016 Document Reviewed: 08/08/2016 Elsevier Interactive Patient Education  [employer]2018 Elsevier Inc.

## 2018-03-27 NOTE — Progress Notes (Signed)
Subjective:  I acted as a Neurosurgeon for [employer] Fuller Song, RMA   Patient ID: Sara West, female    DOB: 1966/01/20, 52 y.o.   MRN: [id number]  Chief Complaint  Patient presents with  . Follow-up    HPI  Patient is in today for follow up and she is noting worsening right hip pain recently. startin to affect her sleep and activites of daily living. She tries to minimize medications but has to take a 1/2 a hydrocodone when pain is severe. It starts to throb at night. She has followed with ortho in past and received steroid shots without much relief. She continues to endorse fatigue and anxiety as well but is managing adequately most days. Denies CP/palp/SOB/HA/congestion/fevers/GI or GU c/o. Taking meds as prescribed  Patient Care Team: Bradd Canary, MD as PCP - General (Family Medicine) Noland Fordyce, MD as Consulting Physician (Obstetrics and Gynecology)   Past Medical History:  Diagnosis Date  . Abnormal liver function test 02/06/2017  . Abnormal thyroid blood test 02/06/2017  . ANXIETY STATE NEC 05/22/2007  . Bipolar disorder, unspecified (HCC) 05/22/2007   Qualifier: Diagnosis of  By: Cato Mulligan MD, Bruce    . Fatigue 10/28/2016  . INSOMNIA-SLEEP DISORDER-UNSPEC 12/10/2009  . Leg pain, bilateral 03/05/2013  . Plantar fasciitis, bilateral 03/05/2013  . PSORIASIS 04/24/2007  . Weight gain 01/19/2014    Past Surgical History:  Procedure Laterality Date  . CESAREAN SECTION     x2 requiring blood transfusion (BTL with 2nd one)  . CHOLECYSTECTOMY    . TONSILECTOMY, ADENOIDECTOMY, BILATERAL MYRINGOTOMY AND TUBES      Family History  Problem Relation Age of Onset  . Alcohol abuse Mother   . Cancer Mother        lung, smoker  . Arthritis Sister        walker, uses for balance issues    Social History   Socioeconomic History  . Marital status: Married    Spouse name: Not on file  . Number of children: Not on file  . Years of education: Not on file  . Highest education level: Not  on file  Occupational History  . Occupation: free lance for [employer]  Social Needs  . Financial resource strain: Not on file  . Food insecurity:    Worry: Not on file    Inability: Not on file  . Transportation needs:    Medical: Not on file    Non-medical: Not on file  Tobacco Use  . Smoking status: Former Smoker    Types: Cigarettes    Last attempt to quit: 11/19/2005    Years since quitting: 12.3  . Smokeless tobacco: Never Used  Substance and Sexual Activity  . Alcohol use: Yes  . Drug use: No  . Sexual activity: Not on file  Lifestyle  . Physical activity:    Days per week: Not on file    Minutes per session: Not on file  . Stress: Not on file  Relationships  . Social connections:    Talks on phone: Not on file    Gets together: Not on file    Attends religious service: Not on file    Active member of club or organization: Not on file    Attends meetings of clubs or organizations: Not on file    Relationship status: Not on file  . Intimate partner violence:    Fear of current or ex partner: Not on file    Emotionally abused: Not on  file    Physically abused: Not on file    Forced sexual activity: Not on file  Other Topics Concern  . Not on file  Social History Narrative  . Not on file    Outpatient Medications Prior to Visit  Medication Sig Dispense Refill  . ALPRAZolam (XANAX) 0.25 MG tablet TAKE 1/2 TO 2 TABLETS BY MOUTH TWICE DAILY AS NEEDED FOR ANXIETY/INSOMNIA 90 tablet 0  . amphetamine-dextroamphetamine (ADDERALL) 10 MG tablet Take 1 tablet (10 mg total) by mouth daily. January 2019 30 tablet 0  . amphetamine-dextroamphetamine (ADDERALL) 10 MG tablet Take 1 tablet (10 mg total) by mouth daily. February 2019 30 tablet 0  . amphetamine-dextroamphetamine (ADDERALL) 10 MG tablet Take 1 tablet (10 mg total) by mouth daily. May 2019 30 tablet 0  . aspirin EC 81 MG tablet Take 1 tablet (81 mg total) daily by mouth.    . busPIRone (BUSPAR) 7.5 MG  tablet TAKE 1 TABLET (7.5 MG TOTAL) BY MOUTH 2 (TWO) TIMES DAILY AS NEEDED. 60 tablet 1  . citalopram (CELEXA) 20 MG tablet Take 1 tablet (20 mg total) by mouth daily. 90 tablet 1  . HYDROcodone-acetaminophen (NORCO) 5-325 MG tablet Take 1 tablet by mouth daily as needed for severe pain. 20 tablet 0  . Multiple Vitamins-Minerals (WOMENS MULTIVITAMIN PLUS) TABS Take 1 each by mouth daily. ALIVE NATURAL MVI.    Marland Kitchen. nitroGLYCERIN (NITROSTAT) 0.4 MG SL tablet Place 1 tablet (0.4 mg total) every 5 (five) minutes as needed under the tongue for chest pain. 25 tablet 1  . OLANZapine (ZYPREXA) 2.5 MG tablet Take 1 tablet (2.5 mg total) by mouth at bedtime. 90 tablet 0  . citalopram (CELEXA) 20 MG tablet TAKE 1&1/2 TABLETS (30 MG TOTAL) BY MOUTH DAILY. 135 tablet 1   No facility-administered medications prior to visit.     No Known Allergies  Review of Systems  Constitutional: Positive for malaise/fatigue. Negative for fever.  HENT: Negative for congestion.   Eyes: Negative for blurred vision.  Respiratory: Negative for shortness of breath.   Cardiovascular: Negative for chest pain, palpitations and leg swelling.  Gastrointestinal: Negative for abdominal pain, blood in stool and nausea.  Genitourinary: Negative for dysuria and frequency.  Musculoskeletal: Positive for back pain and joint pain. Negative for falls.  Skin: Negative for rash.  Neurological: Negative for dizziness, loss of consciousness and headaches.  Endo/Heme/Allergies: Negative for environmental allergies.  Psychiatric/Behavioral: Negative for depression. The patient is nervous/anxious.        Objective:    Physical Exam  Constitutional: She is oriented to person, place, and time. She appears well-developed and well-nourished. No distress.  HENT:  Head: Normocephalic and atraumatic.  Nose: Nose normal.  Eyes: Right eye exhibits no discharge. Left eye exhibits no discharge.  Neck: Normal range of motion. Neck supple.    Cardiovascular: Normal rate and regular rhythm.  No murmur heard. Pulmonary/Chest: Effort normal and breath sounds normal.  Abdominal: Soft. Bowel sounds are normal. There is no tenderness.  Musculoskeletal: She exhibits no edema.  Neurological: She is alert and oriented to person, place, and time.  Skin: Skin is warm and dry.  Psychiatric: She has a normal mood and affect.  Nursing note and vitals reviewed.   BP 132/73 (BP Location: Left Arm, Patient Position: Sitting, Cuff Size: Normal)   Pulse 62   Temp 97.9 F (36.6 C) (Oral)   Resp 18   Wt 162 lb 6.4 oz (73.7 kg)   SpO2 100%   BMI  27.88 kg/m  Wt Readings from Last 3 Encounters:  03/27/18 162 lb 6.4 oz (73.7 kg)  08/18/17 164 lb (74.4 kg)  07/27/17 163 lb 3.2 oz (74 kg)   BP Readings from Last 3 Encounters:  03/27/18 132/73  08/18/17 130/82  07/27/17 100/62     Immunization History  Administered Date(s) Administered  . Influenza Split 08/03/2011, 08/10/2012  . Influenza Whole 06/28/2013  . Td 12/10/2009    Health Maintenance  Topic Date Due  . HIV Screening  05/03/1981  . COLONOSCOPY  05/03/2016  . MAMMOGRAM  05/12/2017  . PAP SMEAR  07/21/2019  . TETANUS/TDAP  12/11/2019  . INFLUENZA VACCINE  Discontinued    Lab Results  Component Value Date   WBC 7.3 03/27/2018   HGB 12.8 03/27/2018   HCT 37.8 03/27/2018   PLT 227.0 03/27/2018   GLUCOSE 113 (H) 04/02/2018   CHOL 142 07/27/2017   TRIG 77.0 07/27/2017   HDL 48.60 07/27/2017   LDLCALC 78 07/27/2017   ALT 25 04/02/2018   AST 17 04/02/2018   NA 137 04/02/2018   K 4.4 04/02/2018   CL 101 04/02/2018   CREATININE 0.84 04/02/2018   BUN 10 04/02/2018   CO2 29 04/02/2018   TSH 4.75 (H) 03/27/2018   HGBA1C 6.3 03/27/2018    Lab Results  Component Value Date   TSH 4.75 (H) 03/27/2018   Lab Results  Component Value Date   WBC 7.3 03/27/2018   HGB 12.8 03/27/2018   HCT 37.8 03/27/2018   MCV 90.5 03/27/2018   PLT 227.0 03/27/2018   Lab Results   Component Value Date   NA 137 04/02/2018   K 4.4 04/02/2018   CO2 29 04/02/2018   GLUCOSE 113 (H) 04/02/2018   BUN 10 04/02/2018   CREATININE 0.84 04/02/2018   BILITOT 0.3 04/02/2018   ALKPHOS 44 04/02/2018   AST 17 04/02/2018   ALT 25 04/02/2018   PROT 6.7 04/02/2018   ALBUMIN 4.3 04/02/2018   CALCIUM 9.3 04/02/2018   GFR 75.70 04/02/2018   Lab Results  Component Value Date   CHOL 142 07/27/2017   Lab Results  Component Value Date   HDL 48.60 07/27/2017   Lab Results  Component Value Date   LDLCALC 78 07/27/2017   Lab Results  Component Value Date   TRIG 77.0 07/27/2017   Lab Results  Component Value Date   CHOLHDL 3 07/27/2017   Lab Results  Component Value Date   HGBA1C 6.3 03/27/2018         Assessment & Plan:   Problem List Items Addressed This Visit    Palpitations   Relevant Orders   TSH (Completed)   Weight gain    Slight weight loss since last visit with decreased portion sizes and cutting down on carbs       Fatigue    Still struggles but doing better with current meds and lifestyle changes      Dizziness   Relevant Orders   CBC (Completed)   TSH (Completed)   Hyperglycemia    hgba1c acceptable, minimize simple carbs. Increase exercise as tolerated.        Relevant Orders   Hemoglobin A1c (Completed)   Comprehensive metabolic panel (Completed)   Bipolar disorder, unspecified (HCC)    Doing well on current meds. No significant changes.       Renal insufficiency    New taking an over the counter supplement for energy. She will stop, increase water intake and avoid all  OTC supplements and NSAIDs recheck renal functions next week      Right hip pain    Encouraged moist heat and gentle stretching as tolerated. May try Tylenol and prescription meds as directed and report if symptoms worsen or seek immediate care. rx for Tizanidine and referred to sports medicine.      Relevant Orders   Ambulatory referral to Sports Medicine     Other Visit Diagnoses    High risk medication use    -  Primary   Relevant Orders   Pain Mgmt, Profile 8 w/Conf, U (Completed)      I am having Marigny A. Portilla maintain her WOMENS MULTIVITAMIN PLUS, citalopram, busPIRone, OLANZapine, nitroGLYCERIN, aspirin EC, amphetamine-dextroamphetamine, amphetamine-dextroamphetamine, HYDROcodone-acetaminophen, amphetamine-dextroamphetamine, and ALPRAZolam.  No orders of the defined types were placed in this encounter.   CMA served as Neurosurgeon during this visit. History, Physical and Plan performed by medical provider. Documentation and orders reviewed and attested to.  Danise Edge, MD

## 2018-03-27 NOTE — Assessment & Plan Note (Signed)
hgba1c acceptable, minimize simple carbs. Increase exercise as tolerated.  

## 2018-03-27 NOTE — Assessment & Plan Note (Signed)
Slight weight loss since last visit with decreased portion sizes and cutting down on carbs

## 2018-03-28 LAB — COMPREHENSIVE METABOLIC PANEL
ALT: 27 U/L (ref 0–35)
AST: 18 U/L (ref 0–37)
Albumin: 4.4 g/dL (ref 3.5–5.2)
Alkaline Phosphatase: 44 U/L (ref 39–117)
BUN: 11 mg/dL (ref 6–23)
CO2: 30 mEq/L (ref 19–32)
Calcium: 9.2 mg/dL (ref 8.4–10.5)
Chloride: 102 mEq/L (ref 96–112)
Creatinine, Ser: 1.98 mg/dL — ABNORMAL HIGH (ref 0.40–1.20)
GFR: 28.14 mL/min — ABNORMAL LOW (ref >60.00)
Glucose, Bld: 94 mg/dL (ref 70–99)
Potassium: 4.8 mEq/L (ref 3.5–5.1)
Sodium: 138 mEq/L (ref 135–145)
Total Bilirubin: 0.3 mg/dL (ref 0.2–1.2)
Total Protein: 6.9 g/dL (ref 6.0–8.3)

## 2018-03-28 LAB — CBC
HCT: 37.8 % (ref 36.0–46.0)
Hemoglobin: 12.8 g/dL (ref 12.0–15.0)
MCHC: 33.8 g/dL (ref 30.0–36.0)
MCV: 90.5 fl (ref 78.0–100.0)
Platelets: 227 10*3/uL (ref 150.0–400.0)
RBC: 4.18 Mil/uL (ref 3.87–5.11)
RDW: 13 % (ref 11.5–15.5)
WBC: 7.3 10*3/uL (ref 4.0–10.5)

## 2018-03-28 LAB — TSH: TSH: 4.75 u[IU]/mL — ABNORMAL HIGH (ref 0.35–4.50)

## 2018-03-28 LAB — HEMOGLOBIN A1C: Hgb A1c MFr Bld: 6.3 % (ref 4.6–6.5)

## 2018-03-29 ENCOUNTER — Other Ambulatory Visit (INDEPENDENT_AMBULATORY_CARE_PROVIDER_SITE_OTHER): Payer: Commercial Managed Care - PPO

## 2018-03-29 ENCOUNTER — Encounter: Payer: Self-pay | Admitting: Family Medicine

## 2018-03-29 DIAGNOSIS — I10 Essential (primary) hypertension: Secondary | ICD-10-CM

## 2018-03-29 DIAGNOSIS — R7989 Other specified abnormal findings of blood chemistry: Secondary | ICD-10-CM

## 2018-03-29 LAB — T4, FREE: Free T4: 0.68 ng/dL (ref 0.60–1.60)

## 2018-03-29 LAB — T3, FREE: T3, Free: 3 pg/mL (ref 2.3–4.2)

## 2018-04-01 LAB — PAIN MGMT, PROFILE 8 W/CONF, U
6 Acetylmorphine: NEGATIVE ng/mL (ref <10)
Alcohol Metabolites: NEGATIVE ng/mL (ref <500)
Alphahydroxyalprazolam: NEGATIVE ng/mL (ref <25)
Alphahydroxymidazolam: NEGATIVE ng/mL (ref <50)
Alphahydroxytriazolam: NEGATIVE ng/mL (ref <50)
Aminoclonazepam: NEGATIVE ng/mL (ref <25)
Amphetamines: NEGATIVE ng/mL (ref <500)
Benzodiazepines: NEGATIVE ng/mL (ref <100)
Buprenorphine, Urine: NEGATIVE ng/mL (ref <5)
Cocaine Metabolite: NEGATIVE ng/mL (ref <150)
Creatinine: 68.9 mg/dL
Hydroxyethylflurazepam: NEGATIVE ng/mL (ref <50)
Lorazepam: NEGATIVE ng/mL (ref <50)
MDMA: NEGATIVE ng/mL (ref <500)
Marijuana Metabolite: 15 ng/mL — ABNORMAL HIGH (ref <5)
Marijuana Metabolite: POSITIVE ng/mL — AB (ref <20)
Nordiazepam: NEGATIVE ng/mL (ref <50)
Opiates: NEGATIVE ng/mL (ref <100)
Oxazepam: NEGATIVE ng/mL (ref <50)
Oxidant: NEGATIVE ug/mL (ref <200)
Oxycodone: NEGATIVE ng/mL (ref <100)
Temazepam: NEGATIVE ng/mL (ref <50)
pH: 6.94 (ref 4.5–9.0)

## 2018-04-02 ENCOUNTER — Ambulatory Visit: Payer: Self-pay | Admitting: *Deleted

## 2018-04-02 ENCOUNTER — Other Ambulatory Visit (INDEPENDENT_AMBULATORY_CARE_PROVIDER_SITE_OTHER): Payer: Commercial Managed Care - PPO

## 2018-04-02 DIAGNOSIS — I10 Essential (primary) hypertension: Secondary | ICD-10-CM

## 2018-04-02 LAB — COMPREHENSIVE METABOLIC PANEL
ALT: 25 U/L (ref 0–35)
AST: 17 U/L (ref 0–37)
Albumin: 4.3 g/dL (ref 3.5–5.2)
Alkaline Phosphatase: 44 U/L (ref 39–117)
BUN: 10 mg/dL (ref 6–23)
CO2: 29 mEq/L (ref 19–32)
Calcium: 9.3 mg/dL (ref 8.4–10.5)
Chloride: 101 mEq/L (ref 96–112)
Creatinine, Ser: 0.84 mg/dL (ref 0.40–1.20)
GFR: 75.7 mL/min (ref >60.00)
Glucose, Bld: 113 mg/dL — ABNORMAL HIGH (ref 70–99)
Potassium: 4.4 mEq/L (ref 3.5–5.1)
Sodium: 137 mEq/L (ref 135–145)
Total Bilirubin: 0.3 mg/dL (ref 0.2–1.2)
Total Protein: 6.7 g/dL (ref 6.0–8.3)

## 2018-04-02 NOTE — Telephone Encounter (Signed)
Pt reports after blood draw this am (F/U CMET) developed right sided mid back pain. Radiates to side "Right under ribs at times." Pain is shooting, intermittent, worse with movement. States radiated to right leg "Tingling" last night, not presently.   Also states lower back achy. Denies any dysuria, fever. Denies SOB, CP; reports palpitations Friday and Saturday.  Pt requesting lab results from this AM. Not released to [facility]. Also requests same day appt, no availability at practice. Attempted to secure appt tomorrow am with recommendation of UC if symptoms worsen, pt declined.  Pt requesting CB from practice for results, states will discuss appt with practice, will go to UC if necessary.  Please advise: [phone] Reason for Disposition . [1] Age > 50 AND [2] no history of prior similar back pain  Answer Assessment - Initial Assessment Questions 1. ONSET: "When did the pain begin?"      This am after having blood drawn 2. LOCATION: "Where does it hurt?" (upper, mid or lower back)     Right mid back, below rib cage, lower back on both sides achy. 3. SEVERITY: "How bad is the pain?"  (e.g., Scale 1-10; mild, moderate, or severe)   - MILD (1-3): doesn't interfere with normal activities    - MODERATE (4-7): interferes with normal activities or awakens from sleep    - SEVERE (8-10): excruciating pain, unable to do any normal activities      Intermittent , shooting  4. PATTERN: "Is the pain constant?" (e.g., yes, no; constant, intermittent)      Intermittent 5. RADIATION: "Does the pain shoot into your legs or elsewhere?"     To right leg last night; hip has been bothering me 6. CAUSE:  "What do you think is causing the back pain?"       7. BACK OVERUSE:  "Any recent lifting of heavy objects, strenuous work or exercise?"     No 8. MEDICATIONS: "What have you taken so far for the pain?" (e.g., nothing, acetaminophen, NSAIDS)     Took 1/2 norco last night and one just now 9. NEUROLOGIC SYMPTOMS:  "Do you have any weakness, numbness, or problems with bowel/bladder control?"     no 10. OTHER SYMPTOMS: "Do you have any other symptoms?" (e.g., fever, abdominal pain, burning with urination, blood in urine)       Palpitations Friday night and again on Saturday  Protocols used: BACK PAIN-A-AH

## 2018-04-03 MED ORDER — TIZANIDINE HCL 4 MG PO TABS: 4.0000 mg | ORAL_TABLET | Freq: Every evening | ORAL | 0 refills | Status: DC | PRN

## 2018-04-03 NOTE — Addendum Note (Signed)
Addended by: Crissie SicklesARTER, Sumner Kirchman A on: 04/03/2018 07:03 PM   Modules accepted: Orders

## 2018-04-03 NOTE — Telephone Encounter (Signed)
Sent medication to pharmacy patient made aware  

## 2018-04-03 NOTE — Telephone Encounter (Signed)
Labs normal unlikely cause of back pain. Advise. Encouraged moist heat and gentle stretching as tolerated. May try Tylenol and prescription meds as directed and report if symptoms worsen o Tylenor seek immediate care. Can cal in Tizanidine 4 mg tabs 1 tab po qhs prn pain. Disp #30 to see if that helps

## 2018-04-03 NOTE — Telephone Encounter (Signed)
Please advise 

## 2018-04-04 DIAGNOSIS — N289 Disorder of kidney and ureter, unspecified: Secondary | ICD-10-CM | POA: Insufficient documentation

## 2018-04-04 DIAGNOSIS — M25551 Pain in right hip: Secondary | ICD-10-CM | POA: Insufficient documentation

## 2018-04-04 NOTE — Assessment & Plan Note (Signed)
Still struggles but doing better with current meds and lifestyle changes

## 2018-04-04 NOTE — Assessment & Plan Note (Signed)
Encouraged moist heat and gentle stretching as tolerated. May try Tylenol and prescription meds as directed and report if symptoms worsen or seek immediate care. rx for Tizanidine and referred to sports medicine.

## 2018-04-04 NOTE — Assessment & Plan Note (Signed)
New taking an over the counter supplement for energy. She will stop, increase water intake and avoid all OTC supplements and NSAIDs recheck renal functions next week

## 2018-04-04 NOTE — Assessment & Plan Note (Signed)
Doing well on current meds. No significant changes.

## 2018-04-09 ENCOUNTER — Other Ambulatory Visit: Payer: Self-pay | Admitting: Family Medicine

## 2018-04-09 MED ORDER — AMPHETAMINE-DEXTROAMPHETAMINE 10 MG PO TABS: 10.0000 mg | ORAL_TABLET | Freq: Every day | ORAL | 0 refills | Status: DC

## 2018-04-09 MED ORDER — HYDROCODONE-ACETAMINOPHEN 5-325 MG PO TABS: 1.0000 | ORAL_TABLET | Freq: Every day | ORAL | 0 refills | Status: DC | PRN

## 2018-04-09 NOTE — Telephone Encounter (Signed)
Requesting:adderall & Norco Contract:yes UDS:low risk next screen 10/03/18 Last OV:03/27/18 Next OV:not scheduled  Last Refill:adderall 01/23/18 #30-0rf Norco 12/18/17 #20-0rf Database:   Please advise

## 2018-04-17 ENCOUNTER — Encounter: Payer: Self-pay | Admitting: Sports Medicine

## 2018-04-17 ENCOUNTER — Ambulatory Visit: Payer: Commercial Managed Care - PPO | Admitting: Sports Medicine

## 2018-04-17 ENCOUNTER — Encounter: Payer: Self-pay | Admitting: Family Medicine

## 2018-04-17 VITALS — BP 124/82 | HR 69 | Ht 64.0 in | Wt 159.6 lb

## 2018-04-17 DIAGNOSIS — M9902 Segmental and somatic dysfunction of thoracic region: Secondary | ICD-10-CM | POA: Diagnosis not present

## 2018-04-17 DIAGNOSIS — M9904 Segmental and somatic dysfunction of sacral region: Secondary | ICD-10-CM | POA: Diagnosis not present

## 2018-04-17 DIAGNOSIS — M24551 Contracture, right hip: Secondary | ICD-10-CM

## 2018-04-17 DIAGNOSIS — R29898 Other symptoms and signs involving the musculoskeletal system: Secondary | ICD-10-CM | POA: Diagnosis not present

## 2018-04-17 DIAGNOSIS — M9905 Segmental and somatic dysfunction of pelvic region: Secondary | ICD-10-CM | POA: Diagnosis not present

## 2018-04-17 DIAGNOSIS — M9903 Segmental and somatic dysfunction of lumbar region: Secondary | ICD-10-CM

## 2018-04-17 DIAGNOSIS — M25559 Pain in unspecified hip: Secondary | ICD-10-CM | POA: Diagnosis not present

## 2018-04-17 NOTE — Progress Notes (Signed)
PROCEDURE NOTE : OSTEOPATHIC MANIPULATION The decision today to treat with Osteopathic Manipulative Therapy (OMT) was based on physical exam findings. Verbal consent was obtained following a discussion with the patient regarding the of risks, benefits and potential side effects, including an acute pain flare,post manipulation soreness and need for repeat treatments.     Contraindications to OMT: NONE  Manipulation was performed as below: Regions treated: Thoracic spine, Lumbar spine, Pelvis and Sacrum OMT Techniques Used: HVLA, muscle energy and myofascial release  The patient tolerated the treatment well and reported Improved symptoms following treatment today. Patient was given medications, exercises, stretches and lifestyle modifications per AVS and verbally.   OSTEOPATHIC/STRUCTURAL EXAM:   T6-T8 rotated left, side bent right L5 FRS right Left on left sacral torsion Right anterior innominate

## 2018-04-17 NOTE — Progress Notes (Signed)
PROCEDURE NOTE: THERAPEUTIC EXERCISES (97110) 15 minutes spent for Therapeutic exercises as below and as referenced in the AVS.  This included exercises focusing on stretching, strengthening, with significant focus on eccentric aspects.   Proper technique shown and discussed handout in great detail with ATC.  All questions were discussed and answered.   Long term goals include an improvement in range of motion, strength, endurance as well as avoiding reinjury. Frequency of visits is one time as determined during today's  office visit. Frequency of exercises to be performed is as per handout.  EXERCISES REVIEWED: Goodman Exercises Hip ABduction strengthening with focus on Glute Medius Recruitment Hip flexor stretching 

## 2018-04-17 NOTE — Patient Instructions (Signed)
Also check out [employer]"Foundation Training" which is a program developed by Dr. Myles LippsEric Goodman.   There are links to a couple of his YouTube Videos below and I would like to see performing one of his videos 5-6 days per week.    A good intro video is: "Independence from Pain 7-minute Video" - [email or web address]   Exercises that focus more on the neck are as below: Dr. Derrill KayGoodman with [employer] [facility] teaching neck and shoulder details Part 1 - https://[email or web address] Part 2 Dr. Derrill KayGoodman with [facility] quick routine to practice daily - https://[email or web address].[email or web address]  Do not try to attempt the entire video when first beginning.    Try breaking of each exercise that he goes into shorter segments.  Otherwise if they perform an exercise for 45 seconds, start with 15 seconds and rest and then resume when they begin the new activity.  If you work your way up to being able to do these videos without having to stop, I expect you will see significant improvements in your pain.  If you enjoy his videos and would like to find out more you can look on his website: [email or web address].  He has a workout streaming option as well as a DVD set available for purchase.  Amazon has the best price for his DVDs.     Please perform the exercise program that we have prepared for you and gone over in detail on a daily basis.  In addition to the handout you were provided you can access your program through: [email or web address]   Your unique program code is: [email or web address]
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
## 2018-05-01 ENCOUNTER — Encounter: Payer: Self-pay | Admitting: Sports Medicine

## 2018-05-01 ENCOUNTER — Ambulatory Visit: Payer: Commercial Managed Care - PPO | Admitting: Sports Medicine

## 2018-05-01 VITALS — BP 120/82 | HR 64 | Ht 64.0 in | Wt 159.2 lb

## 2018-05-01 DIAGNOSIS — M25559 Pain in unspecified hip: Secondary | ICD-10-CM

## 2018-05-01 DIAGNOSIS — R29898 Other symptoms and signs involving the musculoskeletal system: Secondary | ICD-10-CM | POA: Diagnosis not present

## 2018-05-01 DIAGNOSIS — M9902 Segmental and somatic dysfunction of thoracic region: Secondary | ICD-10-CM

## 2018-05-01 DIAGNOSIS — M24551 Contracture, right hip: Secondary | ICD-10-CM

## 2018-05-01 DIAGNOSIS — M9904 Segmental and somatic dysfunction of sacral region: Secondary | ICD-10-CM

## 2018-05-01 DIAGNOSIS — M9905 Segmental and somatic dysfunction of pelvic region: Secondary | ICD-10-CM

## 2018-05-01 DIAGNOSIS — M9903 Segmental and somatic dysfunction of lumbar region: Secondary | ICD-10-CM
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
## 2018-05-15 ENCOUNTER — Other Ambulatory Visit: Payer: Self-pay | Admitting: Family Medicine

## 2018-05-15 ENCOUNTER — Ambulatory Visit (INDEPENDENT_AMBULATORY_CARE_PROVIDER_SITE_OTHER): Payer: Commercial Managed Care - PPO

## 2018-05-15 ENCOUNTER — Encounter: Payer: Self-pay | Admitting: Sports Medicine

## 2018-05-15 ENCOUNTER — Ambulatory Visit: Payer: Commercial Managed Care - PPO | Admitting: Sports Medicine

## 2018-05-15 ENCOUNTER — Encounter: Payer: Self-pay | Admitting: Family Medicine

## 2018-05-15 VITALS — BP 110/82 | HR 72 | Ht 64.0 in | Wt 159.0 lb

## 2018-05-15 DIAGNOSIS — M545 Low back pain: Secondary | ICD-10-CM

## 2018-05-15 DIAGNOSIS — M25559 Pain in unspecified hip: Secondary | ICD-10-CM | POA: Diagnosis not present

## 2018-05-15 DIAGNOSIS — M24551 Contracture, right hip: Secondary | ICD-10-CM

## 2018-05-15 DIAGNOSIS — R29898 Other symptoms and signs involving the musculoskeletal system: Secondary | ICD-10-CM

## 2018-05-15 IMAGING — DX DG LUMBAR SPINE COMPLETE 4+V
5 series · 5 of 5 positions shown · non-contrast
Comparison: Lumbar spine series of [DATE]

CLINICAL DATA: Chronic right hip pain for the past year. Also lower
back pain has begun over the past several weeks with pain radiating
into the right lower extremity. Symptoms began after going to the
GM.

EXAM:
LUMBAR SPINE - COMPLETE 4+ VIEW

[lumbar spine ap]
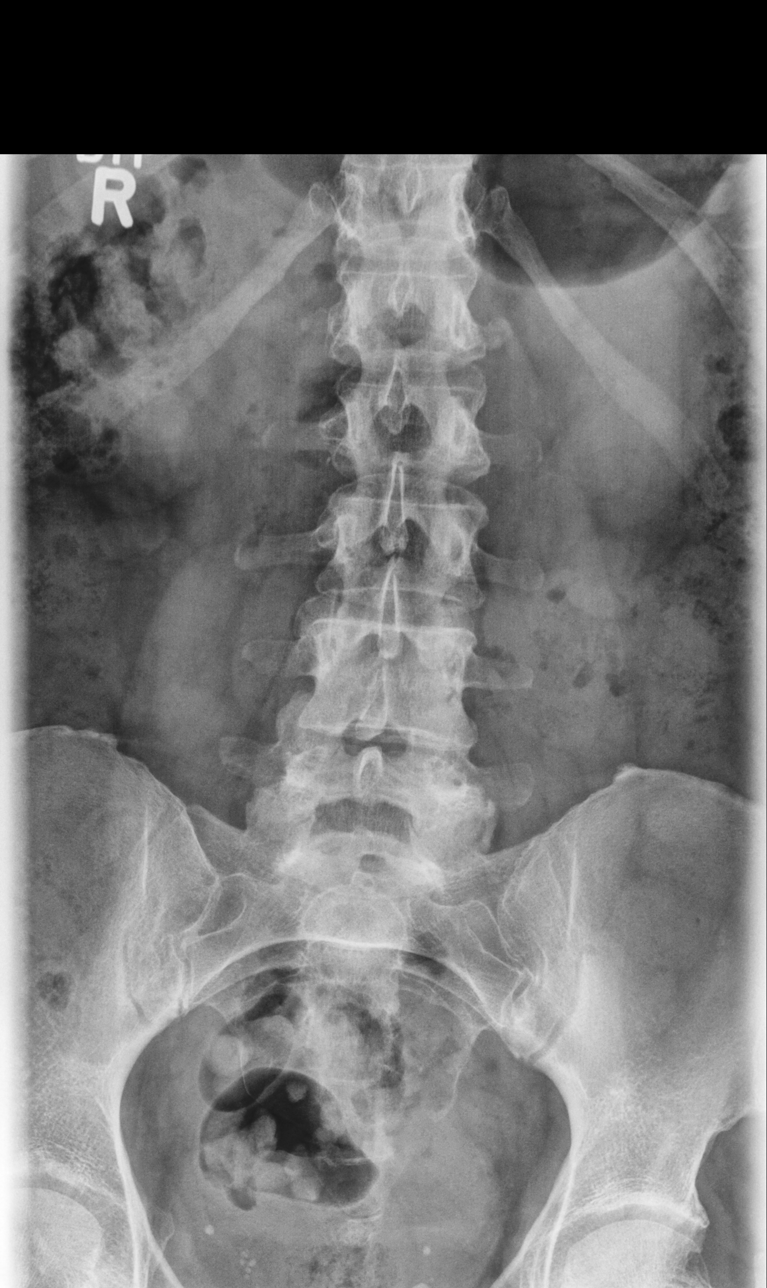

[lumbar spine oblique (1 of 2)]
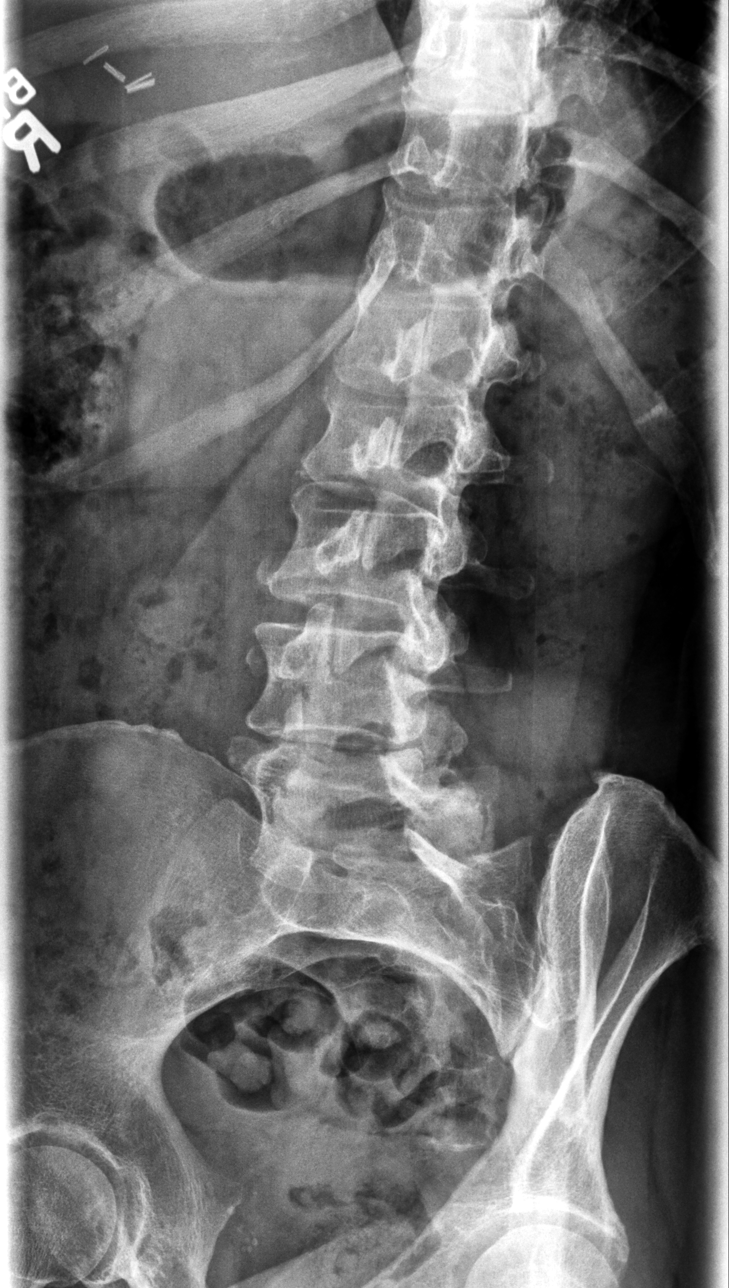

[lumbar spine oblique (2 of 2)]
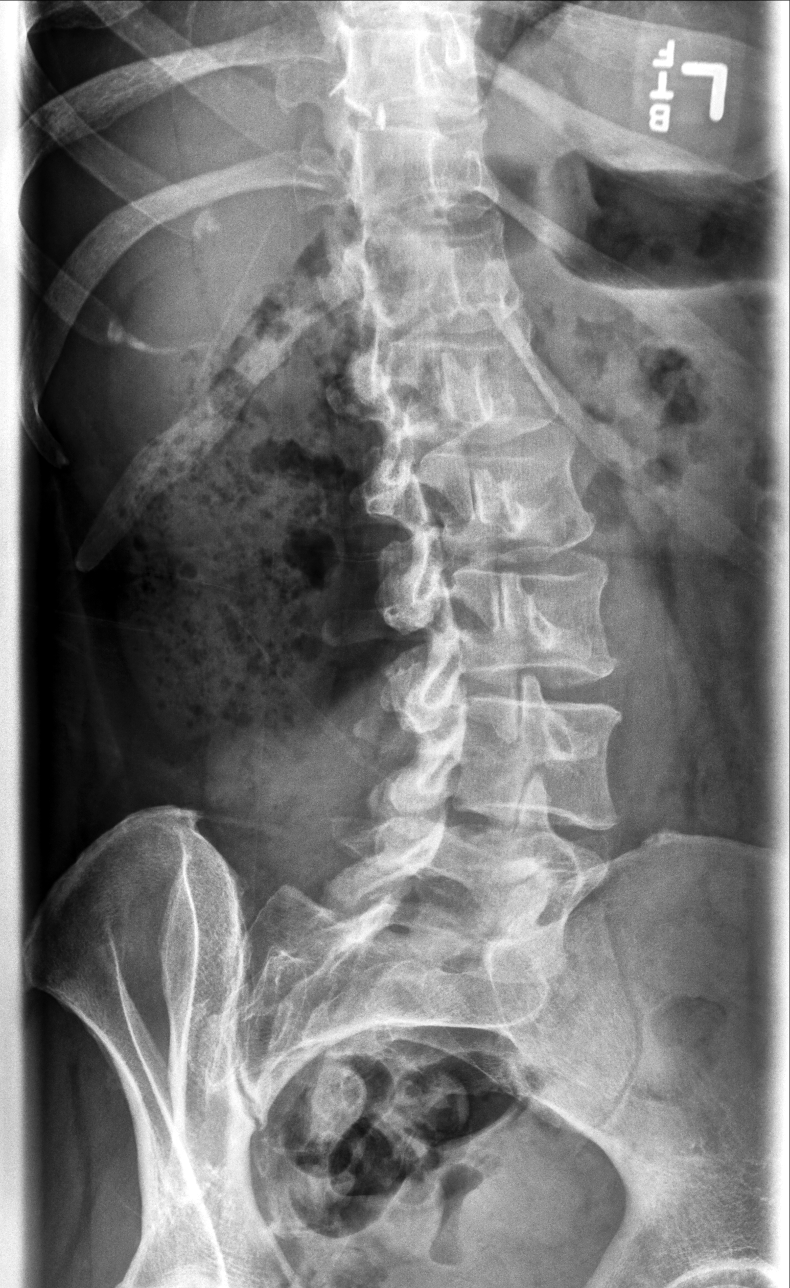

[lumbar spine lat (1 of 2)]
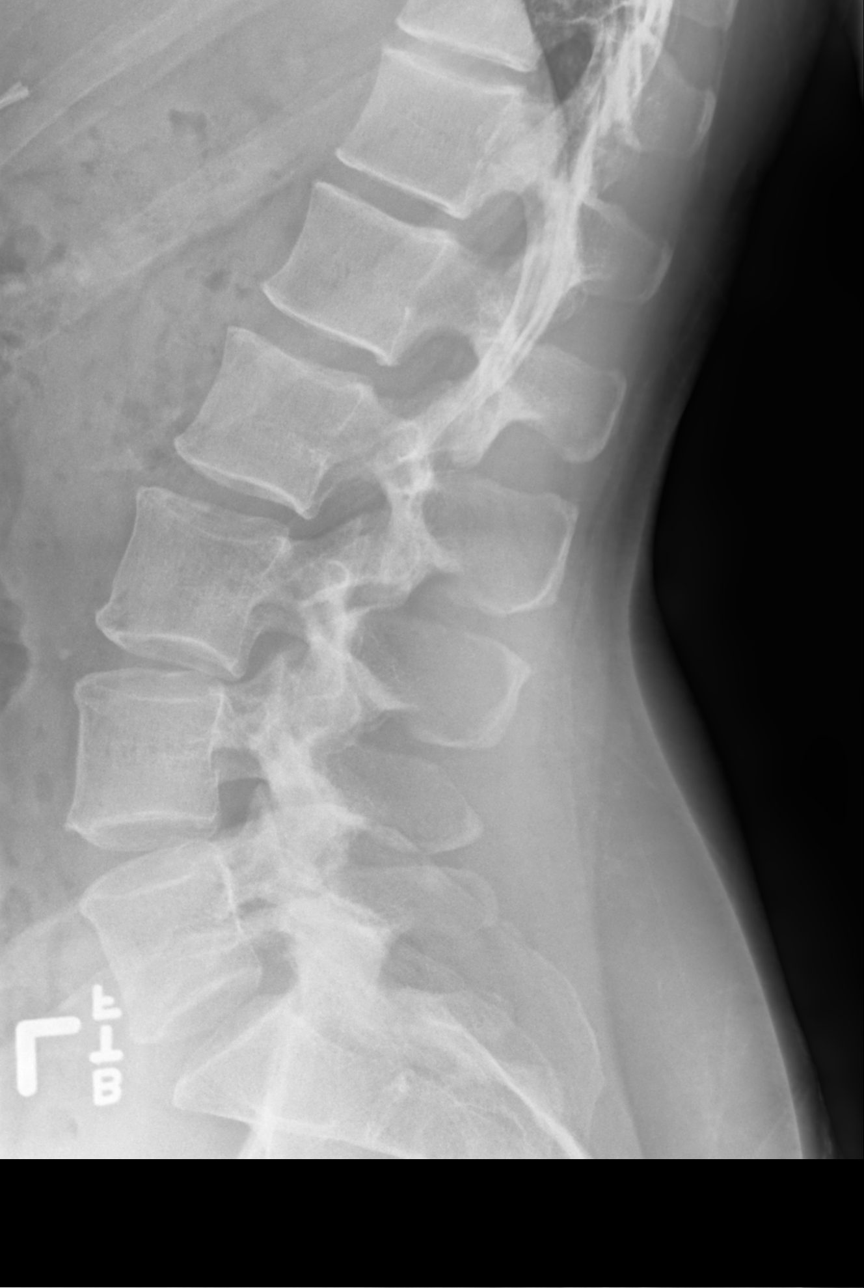

[lumbar spine lat (2 of 2)]
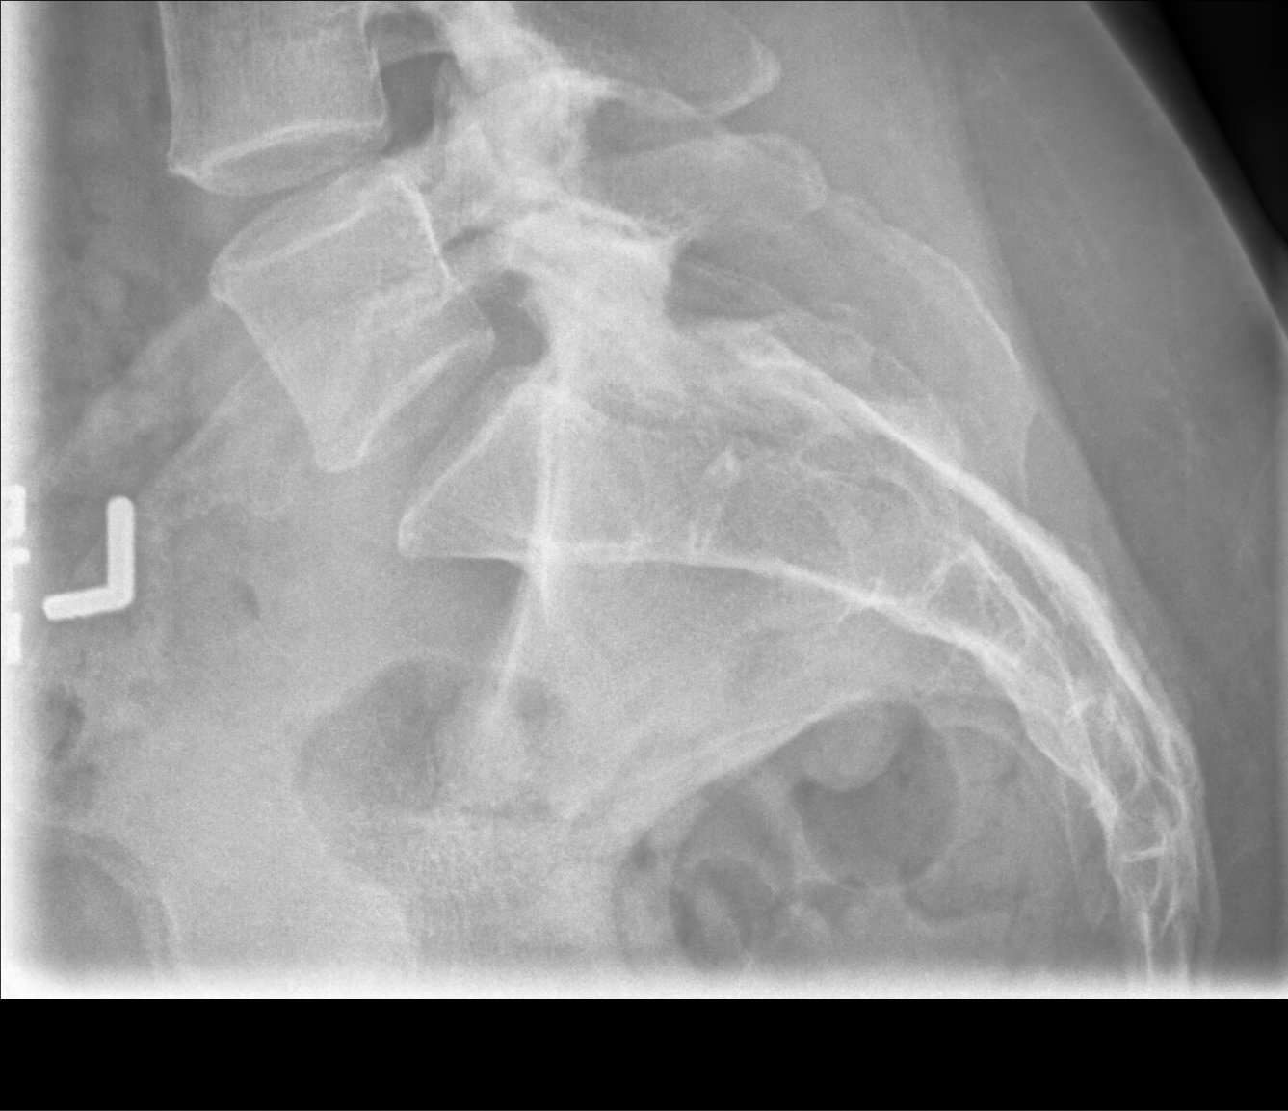

[5 of 5 positions shown; findings below may reference images not displayed]

FINDINGS: The lumbar vertebral bodies are preserved in height. There is gentle
levocurvature of the lumbar spine centered at L3 which is stable.
The pedicles and transverse processes are intact. The disc space
heights are well maintained. There is no spondylolisthesis. There is
mild facet joint hypertrophy from L3-4 through L5-S1.
IMPRESSION: Mild degenerative facet joint change in the lower lumbar spine. No
acute compression fracture, spondylolisthesis nor evidence of
significant disc space height loss.
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
## 2018-05-15 NOTE — Progress Notes (Signed)
PROCEDURE NOTE:  Landmark Guided: Injection: Right hip, greater trochanter  DESCRIPTION OF PROCEDURE:  The patient's clinical condition is marked by substantial pain and/or significant functional disability. Other conservative therapy has not provided relief, is contraindicated, or not appropriate. There is a reasonable likelihood that injection will significantly improve the patient's pain and/or functional impairment.   After discussing the risks, benefits and expected outcomes of the injection and all questions were reviewed and answered, the patient wished to undergo the above named procedure.  Verbal consent was obtained. The skin was then prepped in sterile fashion and the target structure was injected as below:   Single injection performed as below:  PREP: Alcohol and Ethel Chloride APPROACH: direct, single injection, 21g 2 in. INJECTATE: 3 cc 0.5% Marcaine and 1 cc 40mg /mL DepoMedrol ASPIRATE: None DRESSING: Band-Aid  Post procedural instructions including recommending icing and warning signs for infection were reviewed.    This procedure was well tolerated and there were no complications.

## 2018-05-15 NOTE — Patient Instructions (Signed)

## 2018-05-16 MED ORDER — HYDROCODONE-ACETAMINOPHEN 5-325 MG PO TABS: 1.0000 | ORAL_TABLET | Freq: Every day | ORAL | 0 refills | Status: DC | PRN

## 2018-05-16 NOTE — Telephone Encounter (Signed)
Received refill request for HYDROcodone-acetaminophen (NORCO) 5-325 MG tablet.   Last OV: 03/27/2018 Last RF: 04/09/2018   03/27/18:UDS sample given. 07/27/17 Control Substance contract signed

## 2018-05-19 ENCOUNTER — Encounter: Payer: Self-pay | Admitting: Sports Medicine

## 2018-05-21 ENCOUNTER — Other Ambulatory Visit: Payer: Self-pay | Admitting: Family Medicine

## 2018-05-25 ENCOUNTER — Encounter: Payer: Self-pay | Admitting: Family Medicine

## 2018-05-29 ENCOUNTER — Ambulatory Visit: Payer: Commercial Managed Care - PPO | Admitting: Sports Medicine

## 2018-05-29 ENCOUNTER — Encounter: Payer: Self-pay | Admitting: Sports Medicine

## 2018-05-29 VITALS — BP 142/88 | HR 66 | Ht 64.0 in | Wt 159.4 lb

## 2018-05-29 DIAGNOSIS — M545 Low back pain: Secondary | ICD-10-CM | POA: Diagnosis not present

## 2018-05-29 DIAGNOSIS — R29898 Other symptoms and signs involving the musculoskeletal system: Secondary | ICD-10-CM

## 2018-05-29 DIAGNOSIS — M25551 Pain in right hip: Secondary | ICD-10-CM

## 2018-05-29 DIAGNOSIS — M25559 Pain in unspecified hip: Secondary | ICD-10-CM | POA: Diagnosis not present

## 2018-05-29 MED ORDER — GABAPENTIN 300 MG PO CAPS: ORAL_CAPSULE | ORAL | 1 refills | Status: DC
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
## 2018-05-30 ENCOUNTER — Encounter: Payer: Self-pay | Admitting: Sports Medicine

## 2018-07-10 ENCOUNTER — Encounter: Payer: Self-pay | Admitting: Family Medicine

## 2018-07-10 ENCOUNTER — Ambulatory Visit: Payer: Commercial Managed Care - PPO | Admitting: Family Medicine

## 2018-07-10 VITALS — BP 118/78 | HR 70 | Temp 98.5°F | Resp 18 | Wt 158.8 lb

## 2018-07-10 DIAGNOSIS — R52 Pain, unspecified: Secondary | ICD-10-CM | POA: Diagnosis not present

## 2018-07-10 DIAGNOSIS — J209 Acute bronchitis, unspecified: Secondary | ICD-10-CM

## 2018-07-10 DIAGNOSIS — R739 Hyperglycemia, unspecified: Secondary | ICD-10-CM

## 2018-07-10 LAB — POCT INFLUENZA A/B
Influenza A, POC: NEGATIVE
Influenza B, POC: NEGATIVE

## 2018-07-10 MED ORDER — HYDROCODONE-HOMATROPINE 5-1.5 MG/5ML PO SYRP: 5.0000 mL | ORAL_SOLUTION | Freq: Three times a day (TID) | ORAL | 0 refills | Status: DC | PRN

## 2018-07-10 MED ORDER — CEFDINIR 300 MG PO CAPS: 300.0000 mg | ORAL_CAPSULE | Freq: Two times a day (BID) | ORAL | 0 refills | Status: AC

## 2018-07-10 MED FILL — HYDROCODONE-HOMATROPINE SOL: 5-1.5 | 8 days supply | Qty: 120 | Fill #0

## 2018-07-10 MED FILL — CEFDINIR 300 MG CAPS: 300 | 10 days supply | Qty: 20 | Fill #0

## 2018-07-10 NOTE — Patient Instructions (Signed)
Encouraged increased rest and hydration, add probiotics, zinc such as Coldeze or Xicam. Treat fevers as needed. Vitamin C 500 to 1000 mg, elderberry or Mucinex/Guaifenasin twice daily Acute Bronchitis, Adult Acute bronchitis is when air tubes (bronchi) in the lungs suddenly get swollen. The condition can make it hard to breathe. It can also cause these symptoms:  A cough.  Coughing up clear, yellow, or green mucus.  Wheezing.  Chest congestion.  Shortness of breath.  A fever.  Body aches.  Chills.  A sore throat.  Follow these instructions at home: Medicines  Take over-the-counter and prescription medicines only as told by your doctor.  If you were prescribed an antibiotic medicine, take it as told by your doctor. Do not stop taking the antibiotic even if you start to feel better. General instructions  Rest.  Drink enough fluids to keep your pee (urine) clear or pale yellow.  Avoid smoking and secondhand smoke. If you smoke and you need help quitting, ask your doctor. Quitting will help your lungs heal faster.  Use an inhaler, cool mist vaporizer, or humidifier as told by your doctor.  Keep all follow-up visits as told by your doctor. This is important. How is this prevented? To lower your risk of getting this condition again:  Wash your hands often with soap and water. If you cannot use soap and water, use hand sanitizer.  Avoid contact with people who have cold symptoms.  Try not to touch your hands to your mouth, nose, or eyes.  Make sure to get the flu shot every year.  Contact a doctor if:  Your symptoms do not get better in 2 weeks. Get help right away if:  You cough up blood.  You have chest pain.  You have very bad shortness of breath.  You become dehydrated.  You faint (pass out) or keep feeling like you are going to pass out.  You keep throwing up (vomiting).  You have a very bad headache.  Your fever or chills gets worse. This  information is not intended to replace advice given to you by your health care provider. Make sure you discuss any questions you have with your health care provider. Document Released: 02/22/2008 Document Revised: 04/13/2016 Document Reviewed: 02/24/2016 Elsevier Interactive Patient Education  [employer].

## 2018-07-11 ENCOUNTER — Other Ambulatory Visit: Payer: Self-pay | Admitting: Family Medicine

## 2018-07-11 MED ORDER — AMPHETAMINE-DEXTROAMPHETAMINE 10 MG PO TABS: 10.0000 mg | ORAL_TABLET | Freq: Every day | ORAL | 0 refills | Status: DC

## 2018-07-11 NOTE — Telephone Encounter (Signed)
Pt is requesting refill on Adderall.   Last OV: 07/10/2018 Last Fill: 04/09/2018 #30 and 0RF UDS: 03/27/2018 Low risk

## 2018-07-15 NOTE — Assessment & Plan Note (Signed)
Encouraged increased rest and hydration, add probiotics, zinc such as Coldeze or Xicam. Treat fevers as needed. Started on Cefdinir and Hydromet and notify us if no improvement

## 2018-07-15 NOTE — Progress Notes (Signed)
Subjective:    Patient ID: Sara West, female    DOB: 1966/02/16, 52 y.o.   MRN: [id number]  No chief complaint on file.   HPI Patient is in today for evaluation of a cough that she has had for about 3 weeks now that she cannot get rid of.  She is endorsing fatigue, malaise, headache, anorexia, cough that keeps her up at night as well as pressure behind her ears nasal congestion and some sputum production.  No obvious fevers or chills.  No other GI complaints such as diarrhea or vomiting.  She is tried over-the-counter preparations with only marginal and temporary relief. Denies palp/fevers/GI or GU c/o. Taking meds as prescribed  Past Medical History:  Diagnosis Date  . Abnormal liver function test 02/06/2017  . Abnormal thyroid blood test 02/06/2017  . ANXIETY STATE NEC 05/22/2007  . Bipolar disorder, unspecified (HCC) 05/22/2007   Qualifier: Diagnosis of  By: Cato Mulligan MD, Bruce    . Fatigue 10/28/2016  . INSOMNIA-SLEEP DISORDER-UNSPEC 12/10/2009  . Leg pain, bilateral 03/05/2013  . Plantar fasciitis, bilateral 03/05/2013  . PSORIASIS 04/24/2007  . Weight gain 01/19/2014    Past Surgical History:  Procedure Laterality Date  . CESAREAN SECTION     x2 requiring blood transfusion (BTL with 2nd one)  . CHOLECYSTECTOMY    . TONSILECTOMY, ADENOIDECTOMY, BILATERAL MYRINGOTOMY AND TUBES      Family History  Problem Relation Age of Onset  . Alcohol abuse Mother   . Cancer Mother        lung, smoker  . Arthritis Sister        walker, uses for balance issues    Social History   Socioeconomic History  . Marital status: Married    Spouse name: Not on file  . Number of children: Not on file  . Years of education: Not on file  . Highest education level: Not on file  Occupational History  . Occupation: free lance for [employer]  Social Needs  . Financial resource strain: Not on file  . Food insecurity:    Worry: Not on file    Inability: Not on file  . Transportation needs:      Medical: Not on file    Non-medical: Not on file  Tobacco Use  . Smoking status: Former Smoker    Types: Cigarettes    Last attempt to quit: 11/19/2005    Years since quitting: 12.6  . Smokeless tobacco: Never Used  Substance and Sexual Activity  . Alcohol use: Yes  . Drug use: No  . Sexual activity: Not on file  Lifestyle  . Physical activity:    Days per week: Not on file    Minutes per session: Not on file  . Stress: Not on file  Relationships  . Social connections:    Talks on phone: Not on file    Gets together: Not on file    Attends religious service: Not on file    Active member of club or organization: Not on file    Attends meetings of clubs or organizations: Not on file    Relationship status: Not on file  . Intimate partner violence:    Fear of current or ex partner: Not on file    Emotionally abused: Not on file    Physically abused: Not on file    Forced sexual activity: Not on file  Other Topics Concern  . Not on file  Social History Narrative  . Not on file  Outpatient Medications Prior to Visit  Medication Sig Dispense Refill  . ALPRAZolam (XANAX) 0.25 MG tablet TAKE 1/2 TO 2 TABLETS BY MOUTH TWICE DAILY AS NEEDED FOR ANXIETY/INSOMNIA 90 tablet 0  . citalopram (CELEXA) 20 MG tablet Take 1 tablet (20 mg total) by mouth daily. 90 tablet 1  . gabapentin (NEURONTIN) 300 MG capsule Start with 1 tab po qhs X 1 week, then increase to 1 tab po bid X 1 week then 1 tab po tid prn 90 capsule 1  . HYDROcodone-acetaminophen (NORCO) 5-325 MG tablet Take 1 tablet by mouth daily as needed for severe pain. 20 tablet 0  . Multiple Vitamins-Minerals (WOMENS MULTIVITAMIN PLUS) TABS Take 1 each by mouth daily. ALIVE NATURAL MVI.    Marland Kitchen nitroGLYCERIN (NITROSTAT) 0.4 MG SL tablet Place 1 tablet (0.4 mg total) every 5 (five) minutes as needed under the tongue for chest pain. 25 tablet 1  . tiZANidine (ZANAFLEX) 4 MG tablet Take 1 tablet (4 mg total) by mouth at bedtime as  needed for muscle spasms. 30 tablet 0  . amphetamine-dextroamphetamine (ADDERALL) 10 MG tablet Take 1 tablet (10 mg total) by mouth daily. September  2019 30 tablet 0  . amphetamine-dextroamphetamine (ADDERALL) 10 MG tablet Take 1 tablet (10 mg total) by mouth daily. August  2019 30 tablet 0  . amphetamine-dextroamphetamine (ADDERALL) 10 MG tablet Take 1 tablet (10 mg total) by mouth daily. July 2019 30 tablet 0   No facility-administered medications prior to visit.     No Known Allergies  Review of Systems  Constitutional: Positive for malaise/fatigue. Negative for fever.  HENT: Positive for congestion.   Eyes: Negative for blurred vision.  Respiratory: Positive for cough, sputum production and shortness of breath.   Cardiovascular: Negative for chest pain, palpitations and leg swelling.  Gastrointestinal: Negative for abdominal pain, blood in stool and nausea.  Genitourinary: Negative for dysuria and frequency.  Musculoskeletal: Positive for myalgias. Negative for falls.  Skin: Negative for rash.  Neurological: Positive for headaches. Negative for dizziness and loss of consciousness.  Endo/Heme/Allergies: Negative for environmental allergies.  Psychiatric/Behavioral: Negative for depression. The patient is not nervous/anxious.        Objective:    Physical Exam  Constitutional: She is oriented to person, place, and time. She appears well-developed and well-nourished. No distress.  HENT:  Head: Normocephalic and atraumatic.  Nose: Nose normal.  TMs dull and retracted. Dry mucus membranes in mouth. Nasal mucosa boggy and erythematous  Eyes: Right eye exhibits no discharge. Left eye exhibits no discharge.  Neck: Normal range of motion. Neck supple.  Cardiovascular: Normal rate and regular rhythm.  No murmur heard. Pulmonary/Chest: Effort normal and breath sounds normal.  Abdominal: Soft. Bowel sounds are normal. There is no tenderness.  Musculoskeletal: She exhibits no edema.   Neurological: She is alert and oriented to person, place, and time.  Skin: Skin is warm and dry.  Psychiatric: She has a normal mood and affect.  Nursing note and vitals reviewed.   BP 118/78 (BP Location: Left Arm, Patient Position: Sitting, Cuff Size: Normal)   Pulse 70   Temp 98.5 F (36.9 C) (Oral)   Resp 18   Wt 158 lb 12.8 oz (72 kg)   SpO2 98%   BMI 27.26 kg/m  Wt Readings from Last 3 Encounters:  07/10/18 158 lb 12.8 oz (72 kg)  05/29/18 159 lb 6.4 oz (72.3 kg)  05/15/18 159 lb (72.1 kg)     Lab Results  Component Value Date  WBC 7.3 03/27/2018   HGB 12.8 03/27/2018   HCT 37.8 03/27/2018   PLT 227.0 03/27/2018   GLUCOSE 113 (H) 04/02/2018   CHOL 142 07/27/2017   TRIG 77.0 07/27/2017   HDL 48.60 07/27/2017   LDLCALC 78 07/27/2017   ALT 25 04/02/2018   AST 17 04/02/2018   NA 137 04/02/2018   K 4.4 04/02/2018   CL 101 04/02/2018   CREATININE 0.84 04/02/2018   BUN 10 04/02/2018   CO2 29 04/02/2018   TSH 4.75 (H) 03/27/2018   HGBA1C 6.3 03/27/2018    Lab Results  Component Value Date   TSH 4.75 (H) 03/27/2018   Lab Results  Component Value Date   WBC 7.3 03/27/2018   HGB 12.8 03/27/2018   HCT 37.8 03/27/2018   MCV 90.5 03/27/2018   PLT 227.0 03/27/2018   Lab Results  Component Value Date   NA 137 04/02/2018   K 4.4 04/02/2018   CO2 29 04/02/2018   GLUCOSE 113 (H) 04/02/2018   BUN 10 04/02/2018   CREATININE 0.84 04/02/2018   BILITOT 0.3 04/02/2018   ALKPHOS 44 04/02/2018   AST 17 04/02/2018   ALT 25 04/02/2018   PROT 6.7 04/02/2018   ALBUMIN 4.3 04/02/2018   CALCIUM 9.3 04/02/2018   GFR 75.70 04/02/2018   Lab Results  Component Value Date   CHOL 142 07/27/2017   Lab Results  Component Value Date   HDL 48.60 07/27/2017   Lab Results  Component Value Date   LDLCALC 78 07/27/2017   Lab Results  Component Value Date   TRIG 77.0 07/27/2017   Lab Results  Component Value Date   CHOLHDL 3 07/27/2017   Lab Results  Component  Value Date   HGBA1C 6.3 03/27/2018       Assessment & Plan:   Problem List Items Addressed This Visit    Acute bronchitis    Encouraged increased rest and hydration, add probiotics, zinc such as Coldeze or Xicam. Treat fevers as needed. Started on Cefdinir and Hydromet and notify us if no improvement      Hyperglycemia    minimize simple carbs. Increase exercise as tolerated.       Other Visit Diagnoses    Body aches    -  Primary   Relevant Orders   POCT Influenza A/B (Completed)      I am having Sara West start on cefdinir and HYDROcodone-homatropine. I am also having her maintain her WOMENS MULTIVITAMIN PLUS, citalopram, nitroGLYCERIN, ALPRAZolam, tiZANidine, HYDROcodone-acetaminophen, and gabapentin.  Meds ordered this encounter  Medications  . cefdinir (OMNICEF) 300 MG capsule    Sig: Take 1 capsule (300 mg total) by mouth 2 (two) times daily for 10 days.    Dispense:  20 capsule    Refill:  0  . HYDROcodone-homatropine (HYCODAN) 5-1.5 MG/5ML syrup    Sig: Take 5 mLs by mouth every 8 (eight) hours as needed for cough.    Dispense:  120 mL    Refill:  0     Danise Edge, MD

## 2018-07-15 NOTE — Assessment & Plan Note (Signed)
minimize simple carbs. Increase exercise as tolerated.  

## 2018-07-20 ENCOUNTER — Encounter: Payer: Self-pay | Admitting: Sports Medicine

## 2018-07-20 ENCOUNTER — Encounter: Payer: Self-pay | Admitting: Family Medicine

## 2018-07-23 ENCOUNTER — Other Ambulatory Visit: Payer: Self-pay | Admitting: Family Medicine

## 2018-07-23 MED ORDER — HYDROCODONE-HOMATROPINE 5-1.5 MG/5ML PO SYRP: 5.0000 mL | ORAL_SOLUTION | Freq: Three times a day (TID) | ORAL | 0 refills | Status: DC | PRN

## 2018-08-09 ENCOUNTER — Other Ambulatory Visit: Payer: Self-pay | Admitting: Family Medicine

## 2018-08-09 NOTE — Telephone Encounter (Signed)
Requesting:xanax Contract:yes UDS:low risk next screen 09/27/18 Last OV:07/10/18 Next OV:not scheduled  Last Refill:02/21/18 #90-0rf Database:   Please advise

## 2018-08-23 ENCOUNTER — Other Ambulatory Visit: Payer: Self-pay | Admitting: Family Medicine

## 2018-08-24 MED ORDER — AMPHETAMINE-DEXTROAMPHETAMINE 10 MG PO TABS: 10.0000 mg | ORAL_TABLET | Freq: Every day | ORAL | 0 refills | Status: DC

## 2018-08-24 NOTE — Telephone Encounter (Signed)
Last adderall RX: 07/11/18, #30 Last OV: 07/10/18 acute visit Next OV:  Due January 2020 but not yet scheduled UDS:  03/27/18 CSC: 07/27/17; past due CSR: No discrepancies identified

## 2018-09-21 ENCOUNTER — Other Ambulatory Visit: Payer: Self-pay | Admitting: Obstetrics

## 2018-09-21 DIAGNOSIS — N63 Unspecified lump in unspecified breast: Secondary | ICD-10-CM

## 2018-09-27 ENCOUNTER — Ambulatory Visit
Admission: RE | Admit: 2018-09-27 | Discharge: 2018-09-27 | Disposition: A | Discharge status: Home / Self Care | Payer: Commercial Managed Care - PPO | Source: Ambulatory Visit | Attending: Obstetrics | Admitting: Obstetrics

## 2018-09-27 DIAGNOSIS — N63 Unspecified lump in unspecified breast: Secondary | ICD-10-CM

## 2018-09-27 IMAGING — MG DIGITAL DIAGNOSTIC BILATERAL MAMMOGRAM WITH TOMO AND CAD
5 series · 6 of 17 positions shown · non-contrast
Comparison: Previous exam(s).

CLINICAL DATA: 52-year-old female with a palpable abnormality in
the upper-outer right breast.

EXAM:
DIGITAL DIAGNOSTIC BILATERAL MAMMOGRAM WITH CAD AND TOMO
RIGHT BREAST ULTRASOUND

[R CC synth-2D]
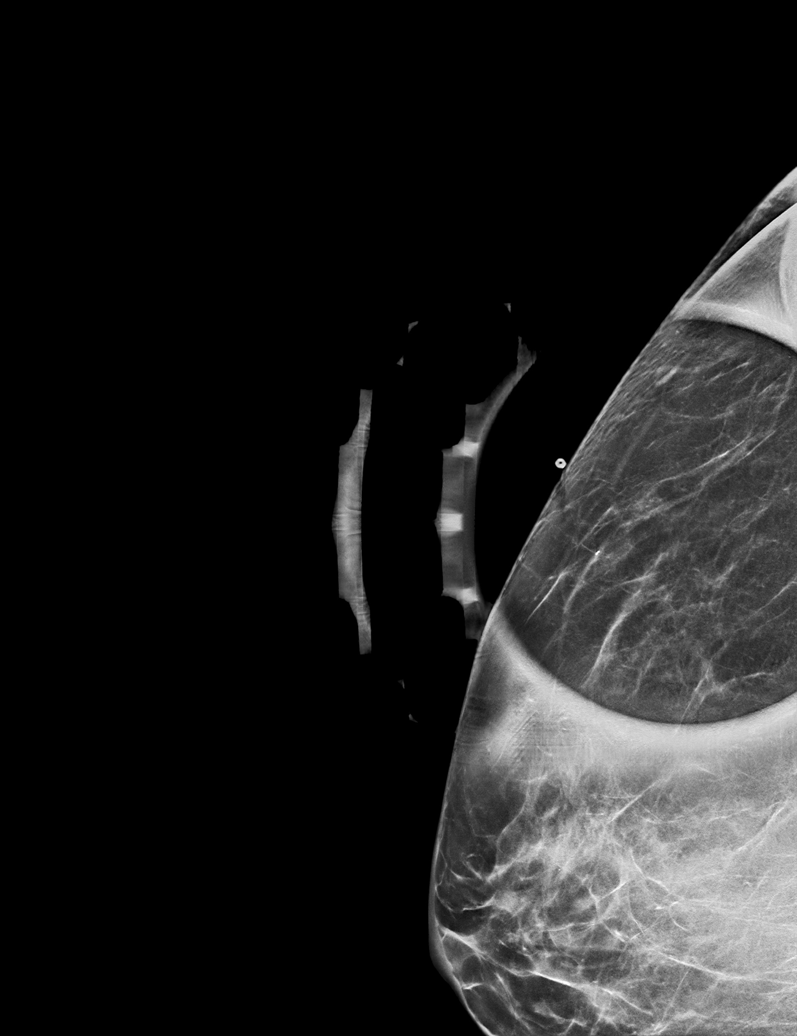

[R MLO synth-2D]
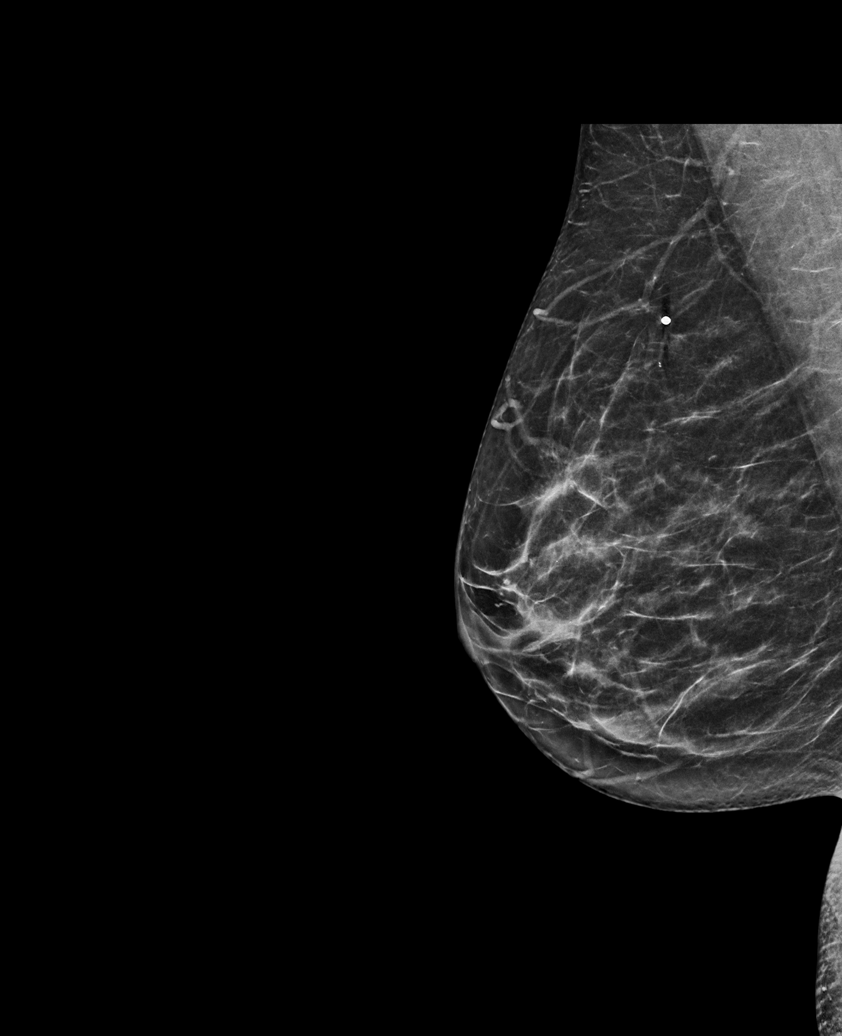

[R MLO tomo · 2 of 62 frames shown]
[frame 21/62]
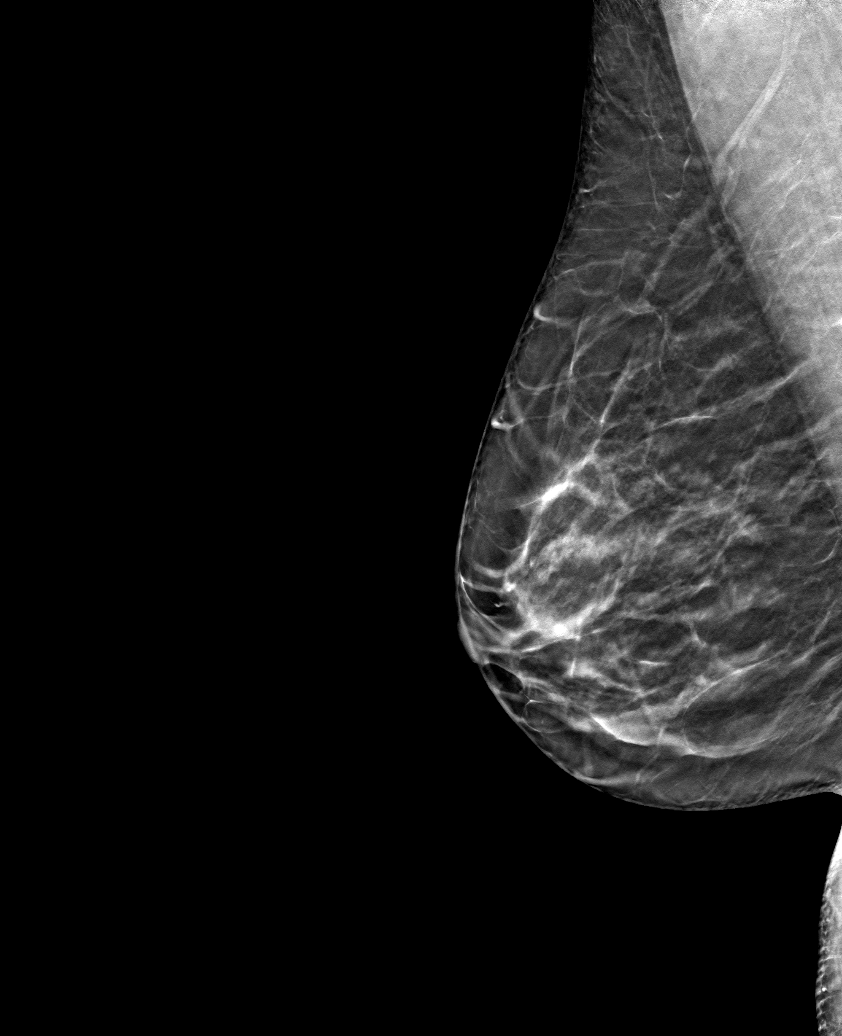
[frame 31/62]
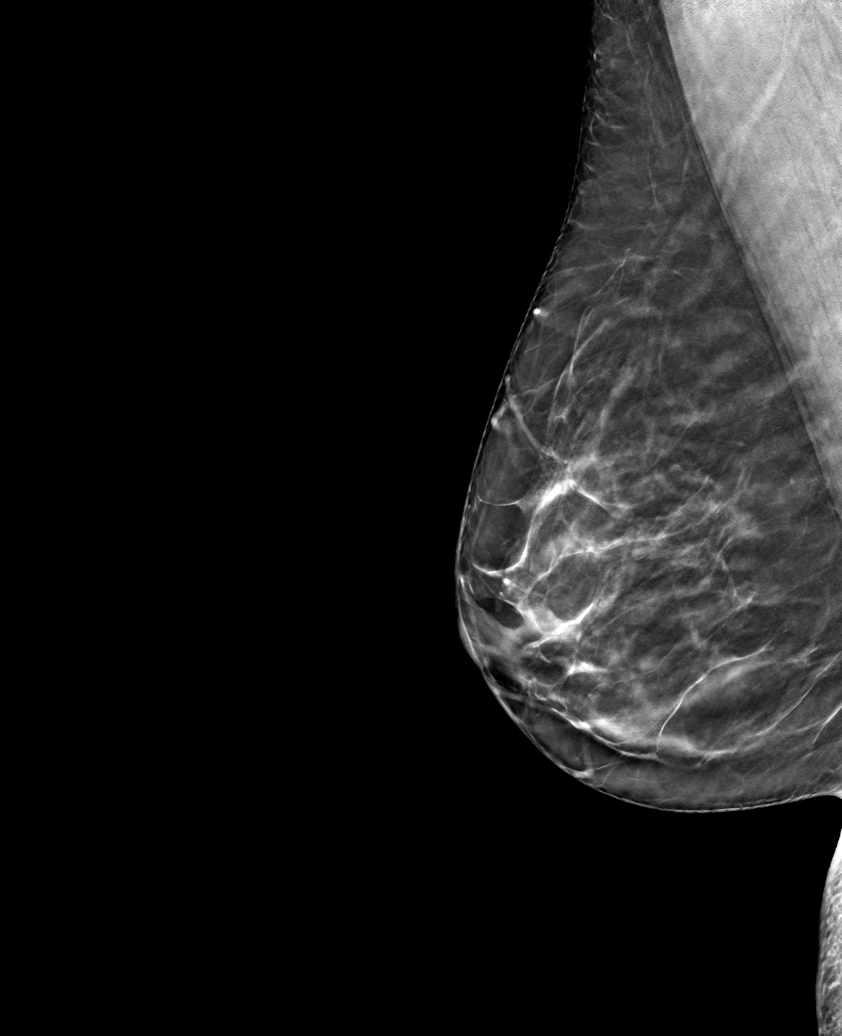

[L MLO tomo · tomo slice 34/67.0]
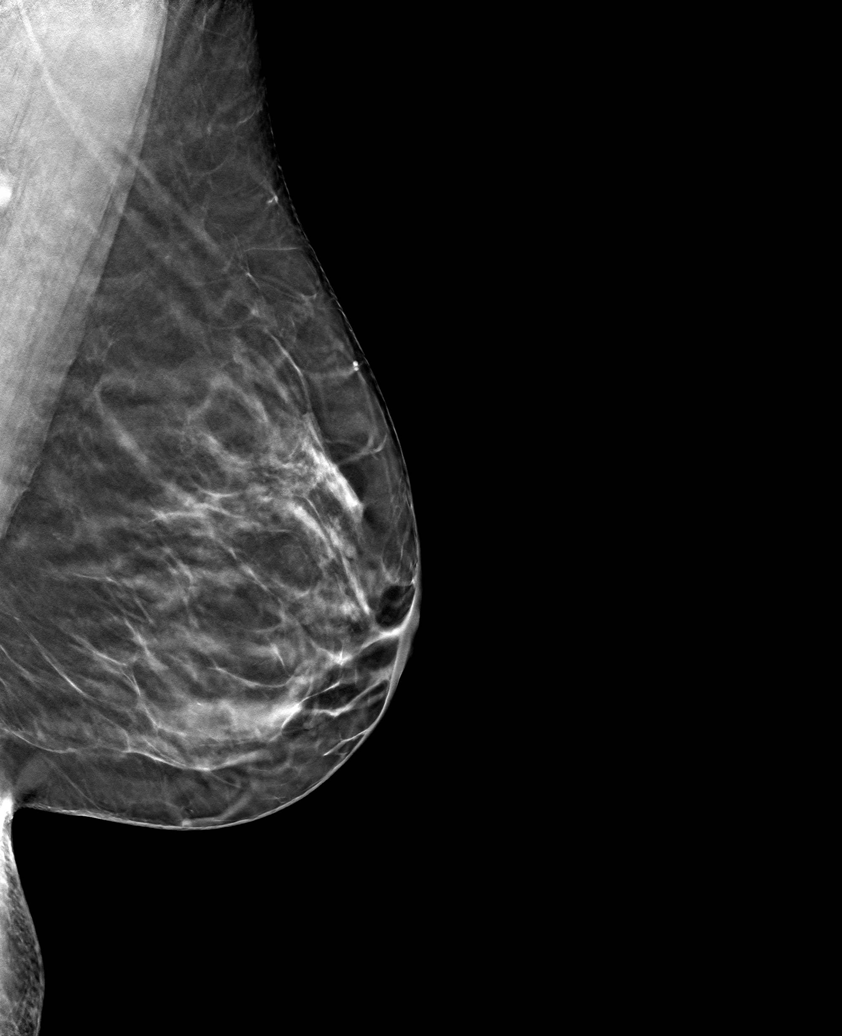

[R CC tomo · tomo slice 32/63.0]
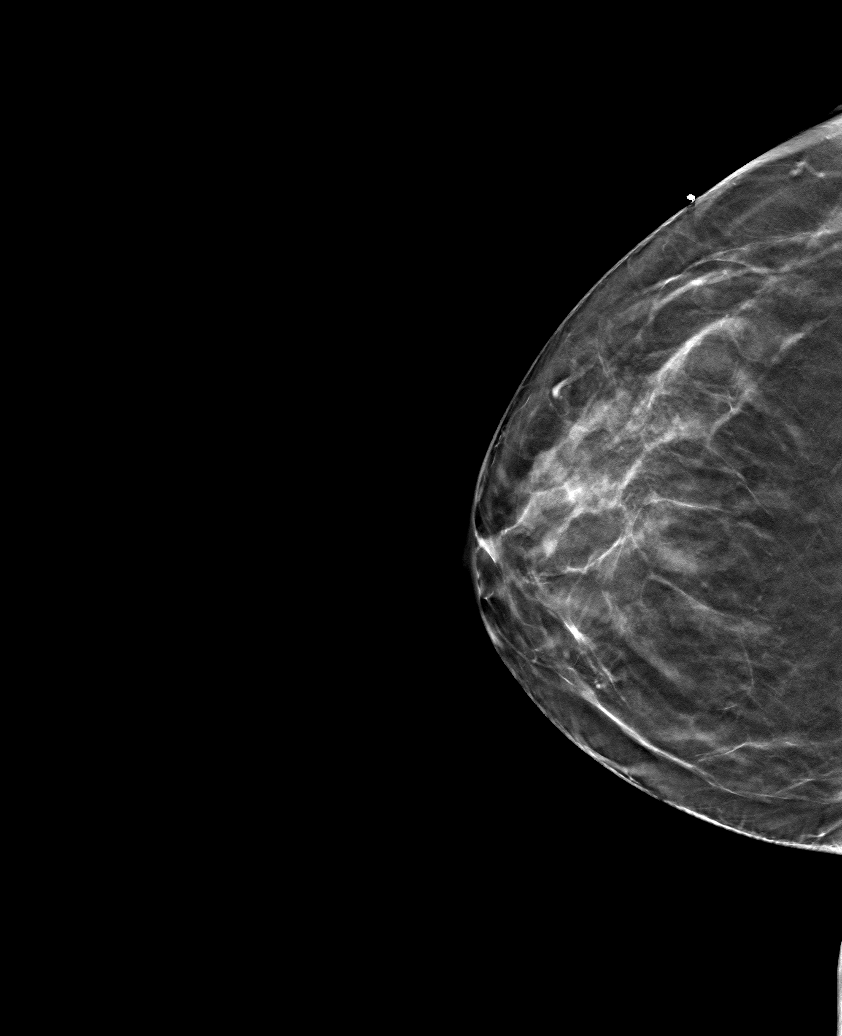

[6 of 17 positions shown; findings below may reference images not displayed]

ACR Breast Density Category c: The breast tissue is heterogeneously
dense, which may obscure small masses.
FINDINGS: No suspicious masses or calcifications are seen in either breast.
Spot compression CC tomograms were performed over the palpable area
of concern in the upper-outer right breast with no abnormalities
identified.

Mammographic images were processed with CAD.

Physical examination of the right breast reveals soft thickening in
the upper-outer quadrant at the approximate 10 30 to 11 o'clock
position.

Targeted ultrasound of the right breast was performed. No suspicious
masses or abnormality seen, only heterogeneous fibroglandular tissue
identified.
IMPRESSION: 1.  No mammographic evidence of malignancy in either breast.

2. No mammographic or sonographic abnormalities at site of palpable
concern in the right breast.

RECOMMENDATION:
1. Recommend further management of the right breast palpable
abnormality be based on clinical assessment.

2.  Screening mammogram in one year.(Code:[MR])

I have discussed the findings and recommendations with the patient.
Results were also provided in writing at the conclusion of the
visit. If applicable, a reminder letter will be sent to the patient
regarding the next appointment.

BI-RADS CATEGORY  1: Negative.

## 2018-09-27 IMAGING — US ULTRASOUND RIGHT BREAST LIMITED
1 series · 3 of 3 positions shown · non-contrast
Comparison: Previous exam(s).

CLINICAL DATA: 52-year-old female with a palpable abnormality in
the upper-outer right breast.

EXAM:
DIGITAL DIAGNOSTIC BILATERAL MAMMOGRAM WITH CAD AND TOMO
RIGHT BREAST ULTRASOUND

[Series 1: ultrasound right breast limited · 0.06mm/px · 3 of 3 slices shown]
[im 1/3]
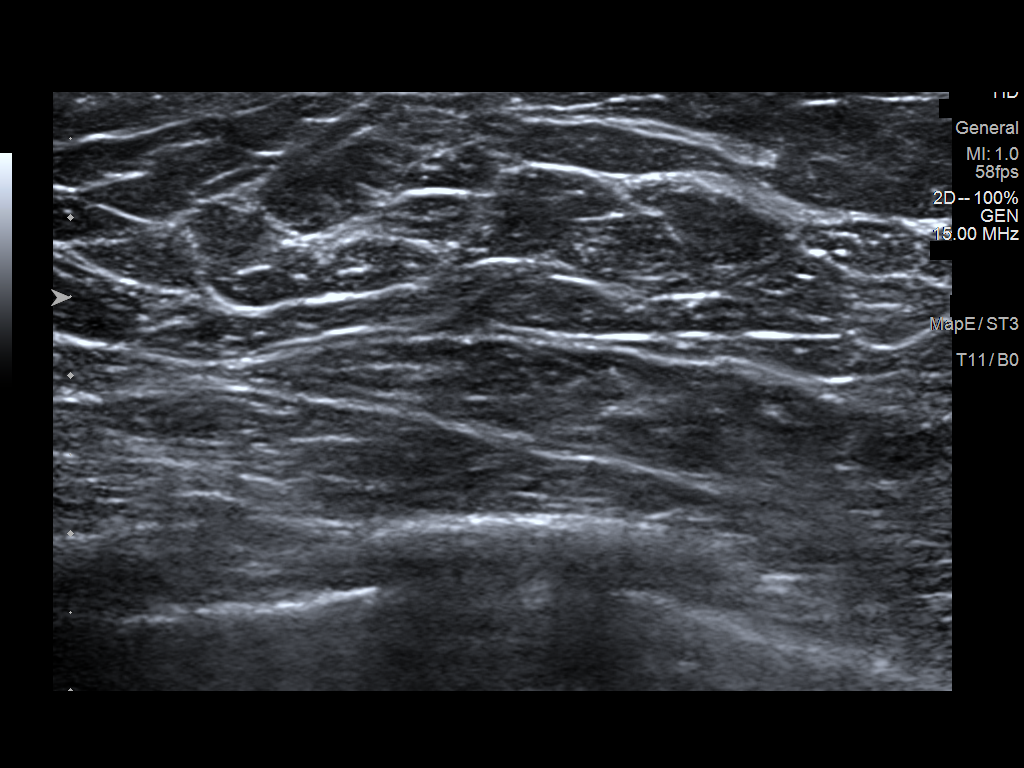
[im 2/3]
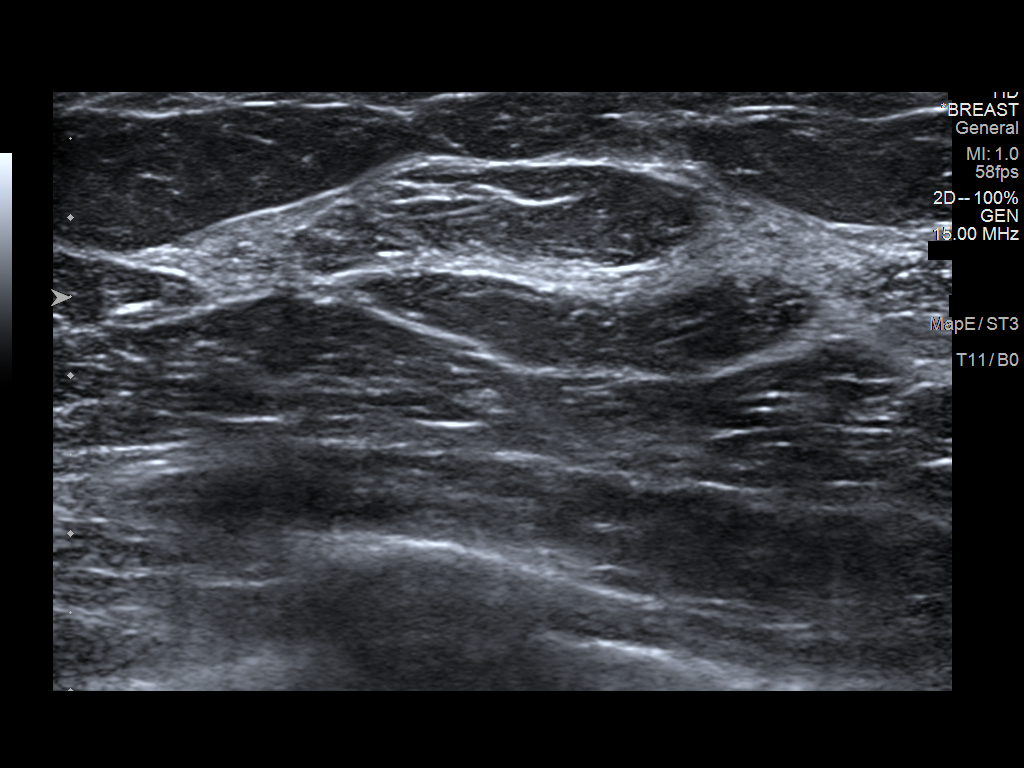
[im 3/3]
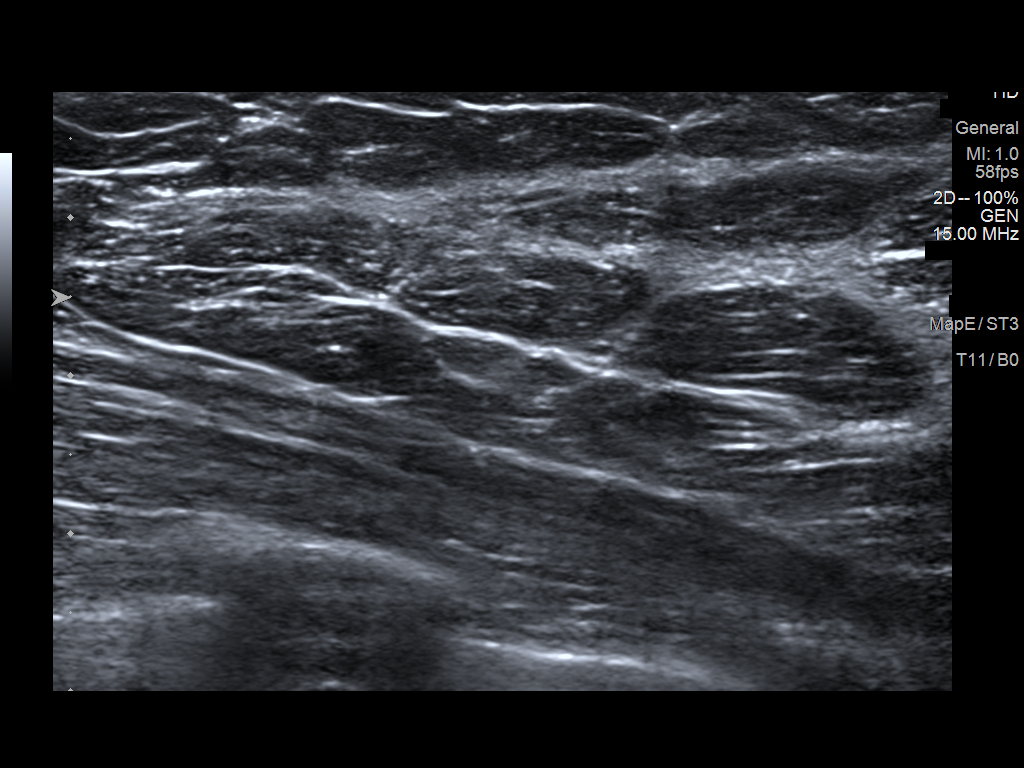

[3 of 3 positions shown; findings below may reference images not displayed]

ACR Breast Density Category c: The breast tissue is heterogeneously
dense, which may obscure small masses.
FINDINGS: No suspicious masses or calcifications are seen in either breast.
Spot compression CC tomograms were performed over the palpable area
of concern in the upper-outer right breast with no abnormalities
identified.

Mammographic images were processed with CAD.

Physical examination of the right breast reveals soft thickening in
the upper-outer quadrant at the approximate 10 30 to 11 o'clock
position.

Targeted ultrasound of the right breast was performed. No suspicious
masses or abnormality seen, only heterogeneous fibroglandular tissue
identified.
IMPRESSION: 1.  No mammographic evidence of malignancy in either breast.

2. No mammographic or sonographic abnormalities at site of palpable
concern in the right breast.

RECOMMENDATION:
1. Recommend further management of the right breast palpable
abnormality be based on clinical assessment.

2.  Screening mammogram in one year.(Code:[MR])

I have discussed the findings and recommendations with the patient.
Results were also provided in writing at the conclusion of the
visit. If applicable, a reminder letter will be sent to the patient
regarding the next appointment.

BI-RADS CATEGORY  1: Negative.

## 2018-10-05 ENCOUNTER — Other Ambulatory Visit: Payer: Self-pay | Admitting: Family Medicine

## 2018-10-05 NOTE — Telephone Encounter (Signed)
Requesting:Adderall  Contract:yes UDS:low risk next screen 10/28/18 Last OV:07/10/18 Next OV:not scheduled  Last Refill:08/24/18  #[id number] Database:   Please advise

## 2018-10-07 ENCOUNTER — Other Ambulatory Visit: Payer: Self-pay | Admitting: Family Medicine

## 2018-10-07 MED ORDER — AMPHETAMINE-DEXTROAMPHETAMINE 10 MG PO TABS: 10.0000 mg | ORAL_TABLET | Freq: Every day | ORAL | 0 refills | Status: DC

## 2018-10-07 NOTE — Telephone Encounter (Signed)
Refilled but will need visit by April

## 2018-10-08 NOTE — Telephone Encounter (Signed)
Sent patient mychart message asking her to call the office to schedule an appointment

## 2018-10-18 ENCOUNTER — Other Ambulatory Visit: Payer: Self-pay | Admitting: Family Medicine

## 2018-10-18 MED ORDER — ALPRAZOLAM 0.25 MG PO TABS: ORAL_TABLET | ORAL | 0 refills | Status: DC

## 2018-10-25 ENCOUNTER — Ambulatory Visit: Payer: Commercial Managed Care - PPO | Admitting: Family Medicine

## 2018-10-25 VITALS — BP 100/72 | HR 68 | Temp 98.2°F | Resp 18 | Wt 152.4 lb

## 2018-10-25 DIAGNOSIS — M79605 Pain in left leg: Secondary | ICD-10-CM

## 2018-10-25 DIAGNOSIS — F419 Anxiety disorder, unspecified: Secondary | ICD-10-CM | POA: Diagnosis not present

## 2018-10-25 DIAGNOSIS — M25551 Pain in right hip: Secondary | ICD-10-CM

## 2018-10-25 DIAGNOSIS — N289 Disorder of kidney and ureter, unspecified: Secondary | ICD-10-CM

## 2018-10-25 DIAGNOSIS — Z79899 Other long term (current) drug therapy: Secondary | ICD-10-CM

## 2018-10-25 DIAGNOSIS — R519 Headache, unspecified: Secondary | ICD-10-CM

## 2018-10-25 DIAGNOSIS — F319 Bipolar disorder, unspecified: Secondary | ICD-10-CM

## 2018-10-25 DIAGNOSIS — F329 Major depressive disorder, single episode, unspecified: Secondary | ICD-10-CM

## 2018-10-25 DIAGNOSIS — R002 Palpitations: Secondary | ICD-10-CM | POA: Diagnosis not present

## 2018-10-25 DIAGNOSIS — R7989 Other specified abnormal findings of blood chemistry: Secondary | ICD-10-CM

## 2018-10-25 DIAGNOSIS — R739 Hyperglycemia, unspecified: Secondary | ICD-10-CM

## 2018-10-25 DIAGNOSIS — R51 Headache: Secondary | ICD-10-CM

## 2018-10-25 DIAGNOSIS — M79604 Pain in right leg: Secondary | ICD-10-CM

## 2018-10-25 DIAGNOSIS — F32A Depression, unspecified: Secondary | ICD-10-CM

## 2018-10-25 LAB — T4, FREE: Free T4: 0.89 ng/dL (ref 0.60–1.60)

## 2018-10-25 LAB — COMPREHENSIVE METABOLIC PANEL
ALT: 32 U/L (ref 0–35)
AST: 21 U/L (ref 0–37)
Albumin: 4.7 g/dL (ref 3.5–5.2)
Alkaline Phosphatase: 45 U/L (ref 39–117)
BUN: 9 mg/dL (ref 6–23)
CO2: 30 mEq/L (ref 19–32)
Calcium: 9.8 mg/dL (ref 8.4–10.5)
Chloride: 105 mEq/L (ref 96–112)
Creatinine, Ser: 0.78 mg/dL (ref 0.40–1.20)
GFR: 77.41 mL/min (ref >60.00)
Glucose, Bld: 84 mg/dL (ref 70–99)
Potassium: 5.3 mEq/L — ABNORMAL HIGH (ref 3.5–5.1)
Sodium: 141 mEq/L (ref 135–145)
Total Bilirubin: 0.4 mg/dL (ref 0.2–1.2)
Total Protein: 7 g/dL (ref 6.0–8.3)

## 2018-10-25 LAB — CBC
HCT: 40.4 % (ref 36.0–46.0)
Hemoglobin: 13.6 g/dL (ref 12.0–15.0)
MCHC: 33.7 g/dL (ref 30.0–36.0)
MCV: 90.8 fl (ref 78.0–100.0)
Platelets: 248 10*3/uL (ref 150.0–400.0)
RBC: 4.45 Mil/uL (ref 3.87–5.11)
RDW: 12.8 % (ref 11.5–15.5)
WBC: 5.9 10*3/uL (ref 4.0–10.5)

## 2018-10-25 LAB — LIPID PANEL
Cholesterol: 167 mg/dL (ref 0–200)
HDL: 51.5 mg/dL (ref >39.00)
LDL Cholesterol: 102 mg/dL — ABNORMAL HIGH (ref 0–99)
NonHDL: 115.45
Total CHOL/HDL Ratio: 3
Triglycerides: 69 mg/dL (ref 0.0–149.0)
VLDL: 13.8 mg/dL (ref 0.0–40.0)

## 2018-10-25 LAB — TSH: TSH: 3.12 u[IU]/mL (ref 0.35–4.50)

## 2018-10-25 LAB — HEMOGLOBIN A1C: Hgb A1c MFr Bld: 5.9 % (ref 4.6–6.5)

## 2018-10-25 MED ORDER — CEPHALEXIN 500 MG PO CAPS: 500.0000 mg | ORAL_CAPSULE | Freq: Three times a day (TID) | ORAL | 0 refills | Status: DC

## 2018-10-25 NOTE — Assessment & Plan Note (Signed)
hgba1c acceptable, minimize simple carbs. Increase exercise as tolerated.  

## 2018-10-25 NOTE — Assessment & Plan Note (Signed)
She declines Abilify or Zyprexa at this time due to weight gain. She will let us know if she is willing to try and she is getting ready to start family

## 2018-10-25 NOTE — Assessment & Plan Note (Signed)
Struggling with her daughter being diagnosed with severe anorexia and she was hospitalized for months and is now back home. Patient is not working as she tries to help her daughter recover. She is more depressed and struggling with anhedonia. Also has some episodes of energy bursts as well. She declines Ability. Encouraged to consider counseling.

## 2018-10-25 NOTE — Patient Instructions (Signed)
Carb coung Carbohydrate Counting for Diabetes Mellitus, Adult  Carbohydrate counting is a method of keeping track of how many carbohydrates you eat. Eating carbohydrates naturally increases the amount of sugar (glucose) in the blood. Counting how many carbohydrates you eat helps keep your blood glucose within normal limits, which helps you manage your diabetes (diabetes mellitus). It is important to know how many carbohydrates you can safely have in each meal. This is different for every person. A diet and nutrition specialist (registered dietitian) can help you make a meal plan and calculate how many carbohydrates you should have at each meal and snack. Carbohydrates are found in the following foods:  Grains, such as breads and cereals.  Dried beans and soy products.  Starchy vegetables, such as potatoes, peas, and corn.  Fruit and fruit juices.  Milk and yogurt.  Sweets and snack foods, such as cake, cookies, candy, chips, and soft drinks. How do I count carbohydrates? There are two ways to count carbohydrates in food. You can use either of the methods or a combination of both. Reading "Nutrition Facts" on packaged food The "Nutrition Facts" list is included on the labels of almost all packaged foods and beverages in the [place] It includes:  The serving size.  Information about nutrients in each serving, including the grams (g) of carbohydrate per serving. To use the "Nutrition Facts":  Decide how many servings you will have.  Multiply the number of servings by the number of carbohydrates per serving.  The resulting number is the total amount of carbohydrates that you will be having. Learning standard serving sizes of other foods When you eat carbohydrate foods that are not packaged or do not include "Nutrition Facts" on the label, you need to measure the servings in order to count the amount of carbohydrates:  Measure the foods that you will eat with a food scale or measuring  cup, if needed.  Decide how many standard-size servings you will eat.  Multiply the number of servings by 15. Most carbohydrate-rich foods have about 15 g of carbohydrates per serving. ? For example, if you eat 8 oz (170 g) of strawberries, you will have eaten 2 servings and 30 g of carbohydrates (2 servings x 15 g = 30 g).  For foods that have more than one food mixed, such as soups and casseroles, you must count the carbohydrates in each food that is included. The following list contains standard serving sizes of common carbohydrate-rich foods. Each of these servings has about 15 g of carbohydrates:   hamburger bun or  English muffin.   oz (15 mL) syrup.   oz (14 g) jelly.  1 slice of bread.  1 six-inch tortilla.  3 oz (85 g) cooked rice or pasta.  4 oz (113 g) cooked dried beans.  4 oz (113 g) starchy vegetable, such as peas, corn, or potatoes.  4 oz (113 g) hot cereal.  4 oz (113 g) mashed potatoes or  of a large baked potato.  4 oz (113 g) canned or frozen fruit.  4 oz (120 mL) fruit juice.  4-6 crackers.  6 chicken nuggets.  6 oz (170 g) unsweetened dry cereal.  6 oz (170 g) plain fat-free yogurt or yogurt sweetened with artificial sweeteners.  8 oz (240 mL) milk.  8 oz (170 g) fresh fruit or one small piece of fruit.  24 oz (680 g) popped popcorn. Example of carbohydrate counting Sample meal  3 oz (85 g) chicken breast.  6 oz (  170 g) brown rice.  4 oz (113 g) corn.  8 oz (240 mL) milk.  8 oz (170 g) strawberries with sugar-free whipped topping. Carbohydrate calculation 1. Identify the foods that contain carbohydrates: ? Rice. ? Corn. ? Milk. ? Strawberries. 2. Calculate how many servings you have of each food: ? 2 servings rice. ? 1 serving corn. ? 1 serving milk. ? 1 serving strawberries. 3. Multiply each number of servings by 15 g: ? 2 servings rice x 15 g = 30 g. ? 1 serving corn x 15 g = 15 g. ? 1 serving milk x 15 g = 15  g. ? 1 serving strawberries x 15 g = 15 g. 4. Add together all of the amounts to find the total grams of carbohydrates eaten: ? 30 g + 15 g + 15 g + 15 g = 75 g of carbohydrates total. Summary  Carbohydrate counting is a method of keeping track of how many carbohydrates you eat.  Eating carbohydrates naturally increases the amount of sugar (glucose) in the blood.  Counting how many carbohydrates you eat helps keep your blood glucose within normal limits, which helps you manage your diabetes.  A diet and nutrition specialist (registered dietitian) can help you make a meal plan and calculate how many carbohydrates you should have at each meal and snack. This information is not intended to replace advice given to you by your health care provider. Make sure you discuss any questions you have with your health care provider. Document Released: 09/05/2005 Document Revised: 03/15/2017 Document Reviewed: 02/17/2016 Elsevier Interactive Patient Education  2019 [employer].

## 2018-10-28 NOTE — Assessment & Plan Note (Signed)
Improved with stretching and massage

## 2018-10-28 NOTE — Progress Notes (Signed)
Subjective:    Patient ID: Sara West, female    DOB: Apr 07, 1966, 53 y.o.   MRN: [id number]  No chief complaint on file.   HPI Patient is in today for follow up.  She is under a great deal of stress as her daughter was diagnosed with anorexia and nearly died.  Was in the hospital for over a month and is now back home.  She is also about to lose her job and Program[employer], applications.  The patient herself has not had any recent febrile illness but she does acknowledge her own history of an eating disorder and now with increased stresses having some trouble eating herself.  She notes her hip pain is improved since she started stretching and getting massages.  No recent fall or injury. Denies CP/palp/SOB/HA/congestion/fevers/GI or GU c/o. Taking meds as prescribed  Past Medical History:  Diagnosis Date  . Abnormal liver function test 02/06/2017  . Abnormal thyroid blood test 02/06/2017  . Anxiety 05/22/2007  . Bipolar disorder 05/22/2007  . Fatigue 10/28/2016  . Leg pain, bilateral 03/05/2013  . Plantar fasciitis, bilateral 03/05/2013  . Psoriasis 04/24/2007  . Weight gain 01/19/2014    Past Surgical History:  Procedure Laterality Date  . CESAREAN SECTION     x2 requiring blood transfusion (BTL with 2nd one)  . CHOLECYSTECTOMY    . TONSILECTOMY, ADENOIDECTOMY, BILATERAL MYRINGOTOMY AND TUBES      Family History  Problem Relation Age of Onset  . Alcohol abuse Mother   . Cancer Mother        lung, smoker  . Arthritis Sister        walker, uses for balance issues  . Anorexia nervosa Daughter     Social History   Socioeconomic History  . Marital status: Married    Spouse name: Not on file  . Number of children: Not on file  . Years of education: Not on file  . Highest education level: Not on file  Occupational History  . Occupation: free lance for [employer]  Social Needs  . Financial resource strain: Not on file  . Food insecurity:    Worry: Not on file    Inability:  Not on file  . Transportation needs:    Medical: Not on file    Non-medical: Not on file  Tobacco Use  . Smoking status: Former Smoker    Types: Cigarettes    Last attempt to quit: 11/19/2005    Years since quitting: 12.9  . Smokeless tobacco: Never Used  Substance and Sexual Activity  . Alcohol use: Yes  . Drug use: No  . Sexual activity: Not on file  Lifestyle  . Physical activity:    Days per week: Not on file    Minutes per session: Not on file  . Stress: Not on file  Relationships  . Social connections:    Talks on phone: Not on file    Gets together: Not on file    Attends religious service: Not on file    Active member of club or organization: Not on file    Attends meetings of clubs or organizations: Not on file    Relationship status: Not on file  . Intimate partner violence:    Fear of current or ex partner: Not on file    Emotionally abused: Not on file    Physically abused: Not on file    Forced sexual activity: Not on file  Other Topics Concern  . Not on file  Social History Narrative  . Not on file    Outpatient Medications Prior to Visit  Medication Sig Dispense Refill  . ALPRAZolam (XANAX) 0.25 MG tablet TAKE 1/2 TO 2 TABLETS BY MOUTH TWICE DAILY AS NEEDED FOR ANXIETY/INSOMNIA 90 tablet 0  . amphetamine-dextroamphetamine (ADDERALL) 10 MG tablet Take 1 tablet (10 mg total) by mouth daily. January 2020 30 tablet 0  . citalopram (CELEXA) 20 MG tablet TAKE 1&1/2 TABLETS (30 MG TOTAL) BY MOUTH DAILY. 135 tablet 1  . HYDROcodone-acetaminophen (NORCO) 5-325 MG tablet Take 1 tablet by mouth daily as needed for severe pain. 20 tablet 0  . Multiple Vitamins-Minerals (WOMENS MULTIVITAMIN PLUS) TABS Take 1 each by mouth daily. ALIVE NATURAL MVI.    Marland Kitchen nitroGLYCERIN (NITROSTAT) 0.4 MG SL tablet Place 1 tablet (0.4 mg total) every 5 (five) minutes as needed under the tongue for chest pain. 25 tablet 1  . gabapentin (NEURONTIN) 300 MG capsule Start with 1 tab po qhs X 1  week, then increase to 1 tab po bid X 1 week then 1 tab po tid prn 90 capsule 1  . HYDROcodone-homatropine (HYCODAN) 5-1.5 MG/5ML syrup Take 5 mLs by mouth every 8 (eight) hours as needed for cough. 120 mL 0  . tiZANidine (ZANAFLEX) 4 MG tablet Take 1 tablet (4 mg total) by mouth at bedtime as needed for muscle spasms. 30 tablet 0   No facility-administered medications prior to visit.     No Known Allergies  Review of Systems  Constitutional: Negative for fever and malaise/fatigue.  HENT: Negative for congestion.   Eyes: Negative for blurred vision.  Respiratory: Negative for shortness of breath.   Cardiovascular: Negative for chest pain, palpitations and leg swelling.  Gastrointestinal: Negative for abdominal pain, blood in stool and nausea.  Genitourinary: Negative for dysuria and frequency.  Musculoskeletal: Negative for falls.  Skin: Negative for rash.  Neurological: Negative for dizziness, loss of consciousness and headaches.  Endo/Heme/Allergies: Negative for environmental allergies.  Psychiatric/Behavioral: Positive for depression. Negative for hallucinations, substance abuse and suicidal ideas. The patient is nervous/anxious.        Objective:    Physical Exam Vitals signs and nursing note reviewed.  Constitutional:      General: She is not in acute distress.    Appearance: She is well-developed.  HENT:     Head: Normocephalic and atraumatic.     Nose: Nose normal.  Eyes:     General:        Right eye: No discharge.        Left eye: No discharge.  Neck:     Musculoskeletal: Normal range of motion and neck supple.  Cardiovascular:     Rate and Rhythm: Normal rate and regular rhythm.     Heart sounds: No murmur.  Pulmonary:     Effort: Pulmonary effort is normal.     Breath sounds: Normal breath sounds.  Abdominal:     General: Bowel sounds are normal.     Palpations: Abdomen is soft.     Tenderness: There is no abdominal tenderness.  Skin:    General: Skin  is warm and dry.  Neurological:     Mental Status: She is alert and oriented to person, place, and time.     BP 100/72 (BP Location: Left Arm, Patient Position: Sitting, Cuff Size: Normal)   Pulse 68   Temp 98.2 F (36.8 C) (Oral)   Resp 18   Wt 152 lb 6.4 oz (69.1 kg)   SpO2 98%  BMI 26.16 kg/m  Wt Readings from Last 3 Encounters:  10/25/18 152 lb 6.4 oz (69.1 kg)  07/10/18 158 lb 12.8 oz (72 kg)  05/29/18 159 lb 6.4 oz (72.3 kg)     Lab Results  Component Value Date   WBC 5.9 10/25/2018   HGB 13.6 10/25/2018   HCT 40.4 10/25/2018   PLT 248.0 10/25/2018   GLUCOSE 84 10/25/2018   CHOL 167 10/25/2018   TRIG 69.0 10/25/2018   HDL 51.50 10/25/2018   LDLCALC 102 (H) 10/25/2018   ALT 32 10/25/2018   AST 21 10/25/2018   NA 141 10/25/2018   K 5.3 (H) 10/25/2018   CL 105 10/25/2018   CREATININE 0.78 10/25/2018   BUN 9 10/25/2018   CO2 30 10/25/2018   TSH 3.12 10/25/2018   HGBA1C 5.9 10/25/2018    Lab Results  Component Value Date   TSH 3.12 10/25/2018   Lab Results  Component Value Date   WBC 5.9 10/25/2018   HGB 13.6 10/25/2018   HCT 40.4 10/25/2018   MCV 90.8 10/25/2018   PLT 248.0 10/25/2018   Lab Results  Component Value Date   NA 141 10/25/2018   K 5.3 (H) 10/25/2018   CO2 30 10/25/2018   GLUCOSE 84 10/25/2018   BUN 9 10/25/2018   CREATININE 0.78 10/25/2018   BILITOT 0.4 10/25/2018   ALKPHOS 45 10/25/2018   AST 21 10/25/2018   ALT 32 10/25/2018   PROT 7.0 10/25/2018   ALBUMIN 4.7 10/25/2018   CALCIUM 9.8 10/25/2018   GFR 77.41 10/25/2018   Lab Results  Component Value Date   CHOL 167 10/25/2018   Lab Results  Component Value Date   HDL 51.50 10/25/2018   Lab Results  Component Value Date   LDLCALC 102 (H) 10/25/2018   Lab Results  Component Value Date   TRIG 69.0 10/25/2018   Lab Results  Component Value Date   CHOLHDL 3 10/25/2018   Lab Results  Component Value Date   HGBA1C 5.9 10/25/2018       Assessment & Plan:    Problem List Items Addressed This Visit    Palpitations   Relevant Orders   CBC (Completed)   Leg pain, bilateral   Severe headache   Relevant Orders   CBC (Completed)   Hyperglycemia    hgba1c acceptable, minimize simple carbs. Increase exercise as tolerated.      Relevant Orders   Hemoglobin A1c (Completed)   Comprehensive metabolic panel (Completed)   Lipid panel (Completed)   TSH (Completed)   Bipolar disorder, unspecified (HCC)    She declines Abilify or Zyprexa at this time due to weight gain. She will let us know if she is willing to try and she is getting ready to start family       Renal insufficiency   Right hip pain    Improved with stretching and massage      Anxiety and depression    Struggling with her daughter being diagnosed with severe anorexia and she was hospitalized for months and is now back home. Patient is not working as she tries to help her daughter recover. She is more depressed and struggling with anhedonia. Also has some episodes of energy bursts as well. She declines Ability. Encouraged to consider counseling.        Other Visit Diagnoses    High risk medication use    -  Primary   Abnormal TSH       Relevant Orders  TSH (Completed)   T4, free (Completed)      I have discontinued Shawna A. Debella's tiZANidine, gabapentin, HYDROcodone-homatropine, and cephALEXin. I am also having her maintain her WOMENS MULTIVITAMIN PLUS, nitroGLYCERIN, HYDROcodone-acetaminophen, amphetamine-dextroamphetamine, citalopram, and ALPRAZolam.  Meds ordered this encounter  Medications  . DISCONTD: cephALEXin (KEFLEX) 500 MG capsule    Sig: Take 1 capsule (500 mg total) by mouth 3 (three) times daily.    Dispense:  30 capsule    Refill:  0     Danise EdgeStacey Kimmora [place], MD

## 2018-11-12 ENCOUNTER — Other Ambulatory Visit: Payer: Self-pay | Admitting: Family Medicine

## 2018-11-12 MED ORDER — AMPHETAMINE-DEXTROAMPHETAMINE 10 MG PO TABS: 10.0000 mg | ORAL_TABLET | Freq: Every day | ORAL | 0 refills | Status: DC

## 2018-11-12 MED ORDER — ALPRAZOLAM 0.25 MG PO TABS: ORAL_TABLET | ORAL | 0 refills | Status: DC

## 2018-11-19 ENCOUNTER — Encounter: Payer: Self-pay | Admitting: Family Medicine

## 2019-01-04 ENCOUNTER — Other Ambulatory Visit: Payer: Self-pay | Admitting: Family Medicine

## 2019-01-04 MED ORDER — AMPHETAMINE-DEXTROAMPHETAMINE 10 MG PO TABS: 10.0000 mg | ORAL_TABLET | Freq: Every day | ORAL | 0 refills | Status: DC

## 2019-01-04 NOTE — Telephone Encounter (Signed)
Requesting:adderall  Contract:yes UDS:low risk next screen 09/27/18 Last OV:11/14/18 Next OV:n/a Last Refill:11/12/18  #30-0rf Database:   Please advise

## 2019-03-01 ENCOUNTER — Encounter: Payer: Self-pay | Admitting: Family Medicine

## 2019-03-01 ENCOUNTER — Other Ambulatory Visit: Payer: Self-pay | Admitting: Family Medicine

## 2019-03-01 MED ORDER — AMPHETAMINE-DEXTROAMPHETAMINE 10 MG PO TABS: 10.0000 mg | ORAL_TABLET | Freq: Every day | ORAL | 0 refills | Status: DC

## 2019-03-11 ENCOUNTER — Ambulatory Visit (INDEPENDENT_AMBULATORY_CARE_PROVIDER_SITE_OTHER): Payer: BC Managed Care – PPO | Admitting: Family Medicine

## 2019-03-11 ENCOUNTER — Other Ambulatory Visit: Payer: Self-pay

## 2019-03-11 ENCOUNTER — Encounter: Payer: Self-pay | Admitting: Family Medicine

## 2019-03-11 DIAGNOSIS — N3000 Acute cystitis without hematuria: Secondary | ICD-10-CM

## 2019-03-11 MED ORDER — SULFAMETHOXAZOLE-TRIMETHOPRIM 800-160 MG PO TABS: 1.0000 | ORAL_TABLET | Freq: Two times a day (BID) | ORAL | 0 refills | Status: DC

## 2019-03-11 NOTE — Progress Notes (Signed)
CC: possible UTI  Sara West is a 53 y.o. female here for possible UTI. Due to COVID-19 pandemic, we are interacting via web portal for an electronic face-to-face visit. I verified patient's ID using 2 identifiers. Patient agreed to proceed with visit via this method. Patient is at store, I am at office. Patient and I are present for visit.   Duration: 1 day. Symptoms: urinary frequency and abd cramping Denies: hematuria, fever, chills, nausea, vomiting, dysuria, vaginal discharge Hx of recurrent UTI? No Has had UTI's in past, this is very similar  ROS:  Constitutional: denies fever GU: As noted in HPI  Past Medical History:  Diagnosis Date  . Abnormal liver function test 02/06/2017  . Abnormal thyroid blood test 02/06/2017  . Anxiety 05/22/2007  . Bipolar disorder 05/22/2007  . Fatigue 10/28/2016  . Leg pain, bilateral 03/05/2013  . Plantar fasciitis, bilateral 03/05/2013  . Psoriasis 04/24/2007  . Weight gain 01/19/2014   Exam No conversational dyspnea Age appropriate judgment and insight Nml affect and mood  Acute cystitis without hematuria - Plan: sulfamethoxazole-trimethoprim (BACTRIM DS) 800-160 MG tablet, send message in 2 d if no improvement and will change abx.  Stay hydrated. Seek immediate care if pt starts to develop fevers, new/worsening symptoms, uncontrollable N/V. F/u prn. The patient voiced understanding and agreement to the plan.  [place], DO 03/11/19 11:39 AM

## 2019-04-21 ENCOUNTER — Other Ambulatory Visit: Payer: Self-pay | Admitting: Family Medicine

## 2019-04-23 MED ORDER — AMPHETAMINE-DEXTROAMPHETAMINE 10 MG PO TABS: 10.0000 mg | ORAL_TABLET | Freq: Every day | ORAL | 0 refills | Status: DC

## 2019-04-23 NOTE — Telephone Encounter (Signed)
Requesting:adderall  Contract:yes UDS:n/a Last OV:03/11/19 Next OV:n/a Last Refill:03/01/19 #30-0rf Database:   Please advise

## 2019-04-24 DIAGNOSIS — D101 Benign neoplasm of tongue: Secondary | ICD-10-CM | POA: Diagnosis not present

## 2019-06-06 ENCOUNTER — Other Ambulatory Visit: Payer: Self-pay | Admitting: Family Medicine

## 2019-06-06 MED ORDER — AMPHETAMINE-DEXTROAMPHETAMINE 10 MG PO TABS: 10.0000 mg | ORAL_TABLET | Freq: Every day | ORAL | 0 refills | Status: DC

## 2019-07-02 ENCOUNTER — Other Ambulatory Visit: Payer: Self-pay | Admitting: Family Medicine

## 2019-07-12 ENCOUNTER — Other Ambulatory Visit: Payer: Self-pay | Admitting: Family Medicine

## 2019-07-12 NOTE — Telephone Encounter (Signed)
Requesting:adderall  Contract:yes UDS:n/a Last OV:03/11/19 Next OV:n/a Last Refill:06/06/19  #30-0rf Database:   Please advise

## 2019-07-14 ENCOUNTER — Encounter: Payer: Self-pay | Admitting: Family Medicine

## 2019-07-14 MED ORDER — AMPHETAMINE-DEXTROAMPHETAMINE 10 MG PO TABS: 10.0000 mg | ORAL_TABLET | Freq: Every day | ORAL | 0 refills | Status: DC

## 2019-08-14 DIAGNOSIS — N951 Menopausal and female climacteric states: Secondary | ICD-10-CM | POA: Diagnosis not present

## 2019-08-14 DIAGNOSIS — Z01411 Encounter for gynecological examination (general) (routine) with abnormal findings: Secondary | ICD-10-CM | POA: Diagnosis not present

## 2019-08-14 DIAGNOSIS — Z23 Encounter for immunization: Secondary | ICD-10-CM | POA: Diagnosis not present

## 2019-08-14 DIAGNOSIS — Z130 Encounter for screening for diseases of the blood and blood-forming organs and certain disorders involving the immune mechanism: Secondary | ICD-10-CM | POA: Diagnosis not present

## 2019-08-14 DIAGNOSIS — Z131 Encounter for screening for diabetes mellitus: Secondary | ICD-10-CM | POA: Diagnosis not present

## 2019-08-14 DIAGNOSIS — Z1151 Encounter for screening for human papillomavirus (HPV): Secondary | ICD-10-CM | POA: Diagnosis not present

## 2019-08-14 DIAGNOSIS — N898 Other specified noninflammatory disorders of vagina: Secondary | ICD-10-CM | POA: Diagnosis not present

## 2019-08-14 DIAGNOSIS — N9411 Superficial (introital) dyspareunia: Secondary | ICD-10-CM | POA: Diagnosis not present

## 2019-08-14 DIAGNOSIS — Z01419 Encounter for gynecological examination (general) (routine) without abnormal findings: Secondary | ICD-10-CM | POA: Diagnosis not present

## 2019-08-14 DIAGNOSIS — Z13220 Encounter for screening for lipoid disorders: Secondary | ICD-10-CM | POA: Diagnosis not present

## 2019-08-14 DIAGNOSIS — Z6825 Body mass index (BMI) 25.0-25.9, adult: Secondary | ICD-10-CM | POA: Diagnosis not present

## 2019-08-14 DIAGNOSIS — Z1329 Encounter for screening for other suspected endocrine disorder: Secondary | ICD-10-CM | POA: Diagnosis not present

## 2019-08-14 DIAGNOSIS — Z0000 Encounter for general adult medical examination without abnormal findings: Secondary | ICD-10-CM | POA: Diagnosis not present

## 2019-10-14 ENCOUNTER — Encounter: Payer: Self-pay | Admitting: Family Medicine

## 2019-10-14 ENCOUNTER — Other Ambulatory Visit: Payer: Self-pay | Admitting: Family Medicine

## 2019-10-14 DIAGNOSIS — R198 Other specified symptoms and signs involving the digestive system and abdomen: Secondary | ICD-10-CM

## 2019-10-14 DIAGNOSIS — R09A2 Foreign body sensation, throat: Secondary | ICD-10-CM

## 2019-10-14 DIAGNOSIS — H9209 Otalgia, unspecified ear: Secondary | ICD-10-CM

## 2019-10-14 DIAGNOSIS — R0989 Other specified symptoms and signs involving the circulatory and respiratory systems: Secondary | ICD-10-CM

## 2019-10-14 NOTE — Telephone Encounter (Signed)
Please schedule patient an appointment.  

## 2019-10-15 ENCOUNTER — Ambulatory Visit: Payer: BC Managed Care – PPO | Attending: Internal Medicine

## 2019-10-15 DIAGNOSIS — Z20822 Contact with and (suspected) exposure to covid-19: Secondary | ICD-10-CM | POA: Diagnosis not present

## 2019-10-16 LAB — NOVEL CORONAVIRUS, NAA: SARS-CoV-2, NAA: NOT DETECTED

## 2019-10-17 NOTE — Telephone Encounter (Signed)
LM for pt to call and schedule appt (virtual or in office at her preference)

## 2019-10-23 DIAGNOSIS — F419 Anxiety disorder, unspecified: Secondary | ICD-10-CM | POA: Diagnosis not present

## 2019-10-29 DIAGNOSIS — F419 Anxiety disorder, unspecified: Secondary | ICD-10-CM | POA: Diagnosis not present

## 2019-10-31 DIAGNOSIS — F411 Generalized anxiety disorder: Secondary | ICD-10-CM | POA: Diagnosis not present

## 2019-11-06 ENCOUNTER — Other Ambulatory Visit: Payer: Self-pay | Admitting: Family Medicine

## 2019-11-06 MED ORDER — AMPHETAMINE-DEXTROAMPHETAMINE 10 MG PO TABS: 10.0000 mg | ORAL_TABLET | Freq: Every day | ORAL | 0 refills | Status: DC

## 2019-11-12 DIAGNOSIS — F419 Anxiety disorder, unspecified: Secondary | ICD-10-CM | POA: Diagnosis not present

## 2019-11-18 DIAGNOSIS — Z87891 Personal history of nicotine dependence: Secondary | ICD-10-CM | POA: Diagnosis not present

## 2019-11-18 DIAGNOSIS — Z9089 Acquired absence of other organs: Secondary | ICD-10-CM | POA: Diagnosis not present

## 2019-11-18 DIAGNOSIS — J343 Hypertrophy of nasal turbinates: Secondary | ICD-10-CM | POA: Diagnosis not present

## 2019-11-18 DIAGNOSIS — K219 Gastro-esophageal reflux disease without esophagitis: Secondary | ICD-10-CM | POA: Insufficient documentation

## 2019-11-27 DIAGNOSIS — F419 Anxiety disorder, unspecified: Secondary | ICD-10-CM | POA: Diagnosis not present

## 2019-12-02 ENCOUNTER — Other Ambulatory Visit: Payer: Self-pay

## 2019-12-03 ENCOUNTER — Encounter: Payer: Self-pay | Admitting: Family Medicine

## 2019-12-03 ENCOUNTER — Ambulatory Visit (INDEPENDENT_AMBULATORY_CARE_PROVIDER_SITE_OTHER): Payer: BC Managed Care – PPO | Admitting: Family Medicine

## 2019-12-03 VITALS — BP 140/80 | HR 69 | Temp 97.1°F | Resp 12 | Ht 65.0 in | Wt 153.6 lb

## 2019-12-03 DIAGNOSIS — R739 Hyperglycemia, unspecified: Secondary | ICD-10-CM

## 2019-12-03 DIAGNOSIS — N952 Postmenopausal atrophic vaginitis: Secondary | ICD-10-CM | POA: Diagnosis not present

## 2019-12-03 DIAGNOSIS — N289 Disorder of kidney and ureter, unspecified: Secondary | ICD-10-CM

## 2019-12-03 DIAGNOSIS — R7989 Other specified abnormal findings of blood chemistry: Secondary | ICD-10-CM

## 2019-12-03 DIAGNOSIS — F319 Bipolar disorder, unspecified: Secondary | ICD-10-CM | POA: Diagnosis not present

## 2019-12-03 DIAGNOSIS — R41840 Attention and concentration deficit: Secondary | ICD-10-CM

## 2019-12-03 DIAGNOSIS — Z79899 Other long term (current) drug therapy: Secondary | ICD-10-CM

## 2019-12-03 DIAGNOSIS — Z0000 Encounter for general adult medical examination without abnormal findings: Secondary | ICD-10-CM | POA: Diagnosis not present

## 2019-12-03 DIAGNOSIS — R002 Palpitations: Secondary | ICD-10-CM | POA: Diagnosis not present

## 2019-12-03 MED ORDER — AMPHETAMINE-DEXTROAMPHETAMINE 10 MG PO TABS: 10.0000 mg | ORAL_TABLET | Freq: Every day | ORAL | 0 refills | Status: DC

## 2019-12-03 MED ORDER — CITALOPRAM HYDROBROMIDE 20 MG PO TABS: 20.0000 mg | ORAL_TABLET | Freq: Every day | ORAL | 1 refills | Status: DC

## 2019-12-03 MED ORDER — PREMARIN 0.625 MG/GM VA CREA: 1.0000 | TOPICAL_CREAM | VAGINAL | 3 refills | Status: DC

## 2019-12-03 MED ORDER — OLANZAPINE 2.5 MG PO TABS: 2.5000 mg | ORAL_TABLET | Freq: Every day | ORAL | 3 refills | Status: DC

## 2019-12-03 NOTE — Assessment & Plan Note (Signed)
She has been having more trouble since being off Olanzapine. More obsessive thoughts and OCD behaviors. She restarted some Olanzapine she had left over last week and she feels like it has helped some. She has been really depressed since she had to quit her job to take care of two kids with home schooling. Seeing a therapist. Mr Sara West. Seems to be helping.

## 2019-12-03 NOTE — Patient Instructions (Addendum)
[employer] has a good water based lubricants    Omron Blood Pressure cuff, upper arm, want BP 100-140/60-90 Pulse oximeter, want oxygen in 90s  Weekly vitals  Take Multivitamin with minerals, selenium, zinc, vitamin C Vitamin D 1000-2000 IU daily Probiotic with lactobacillus and bifidophilus Preventive Care 34-54 Years Old, Female Preventive care refers to visits with your health care provider and lifestyle choices that can promote health and wellness. This includes:  A yearly physical exam. This may also be called an annual well check.  Regular dental visits and eye exams.  Immunizations.  Screening for certain conditions.  Healthy lifestyle choices, such as eating a healthy diet, getting regular exercise, not using drugs or products that contain nicotine and tobacco, and limiting alcohol use. What can I expect for my preventive care visit? Physical exam Your health care provider will check your:  Height and weight. This may be used to calculate body mass index (BMI), which tells if you are at a healthy weight.  Heart rate and blood pressure.  Skin for abnormal spots. Counseling Your health care provider may ask you questions about your:  Alcohol, tobacco, and drug use.  Emotional well-being.  Home and relationship well-being.  Sexual activity.  Eating habits.  Work and work Statistician.  Method of birth control.  Menstrual cycle.  Pregnancy history. What immunizations do I need?  Influenza (flu) vaccine  This is recommended every year. Tetanus, diphtheria, and pertussis (Tdap) vaccine  You may need a Td booster every 10 years. Varicella (chickenpox) vaccine  You may need this if you have not been vaccinated. Zoster (shingles) vaccine  You may need this after age 69. Measles, mumps, and rubella (MMR) vaccine  You may need at least one dose of MMR if you were born in 1957 or later. You may also need a second dose. Pneumococcal conjugate (PCV13)  vaccine  You may need this if you have certain conditions and were not previously vaccinated. Pneumococcal polysaccharide (PPSV23) vaccine  You may need one or two doses if you smoke cigarettes or if you have certain conditions. Meningococcal conjugate (MenACWY) vaccine  You may need this if you have certain conditions. Hepatitis A vaccine  You may need this if you have certain conditions or if you travel or work in places where you may be exposed to hepatitis A. Hepatitis B vaccine  You may need this if you have certain conditions or if you travel or work in places where you may be exposed to hepatitis B. Haemophilus influenzae type b (Hib) vaccine  You may need this if you have certain conditions. Human papillomavirus (HPV) vaccine  If recommended by your health care provider, you may need three doses over 6 months. You may receive vaccines as individual doses or as more than one vaccine together in one shot (combination vaccines). Talk with your health care provider about the risks and benefits of combination vaccines. What tests do I need? Blood tests  Lipid and cholesterol levels. These may be checked every 5 years, or more frequently if you are over 20 years old.  Hepatitis C test.  Hepatitis B test. Screening  Lung cancer screening. You may have this screening every year starting at age 72 if you have a 30-pack-year history of smoking and currently smoke or have quit within the past 15 years.  Colorectal cancer screening. All adults should have this screening starting at age 59 and continuing until age 61. Your health care provider may recommend screening at age 64 if  you are at increased risk. You will have tests every 1-10 years, depending on your results and the type of screening test.  Diabetes screening. This is done by checking your blood sugar (glucose) after you have not eaten for a while (fasting). You may have this done every 1-3 years.  Mammogram. This may be  done every 1-2 years. Talk with your health care provider about when you should start having regular mammograms. This may depend on whether you have a family history of breast cancer.  BRCA-related cancer screening. This may be done if you have a family history of breast, ovarian, tubal, or peritoneal cancers.  Pelvic exam and Pap test. This may be done every 3 years starting at age 47. Starting at age 62, this may be done every 5 years if you have a Pap test in combination with an HPV test. Other tests  Sexually transmitted disease (STD) testing.  Bone density scan. This is done to screen for osteoporosis. You may have this scan if you are at high risk for osteoporosis. Follow these instructions at home: Eating and drinking  Eat a diet that includes fresh fruits and vegetables, whole grains, lean protein, and low-fat dairy.  Take vitamin and mineral supplements as recommended by your health care provider.  Do not drink alcohol if: ? Your health care provider tells you not to drink. ? You are pregnant, may be pregnant, or are planning to become pregnant.  If you drink alcohol: ? Limit how much you have to 0-1 drink a day. ? Be aware of how much alcohol is in your drink. In the [place] one drink equals one 12 oz bottle of beer (355 mL), one 5 oz glass of wine (148 mL), or one 1 oz glass of hard liquor (44 mL). Lifestyle  Take daily care of your teeth and gums.  Stay active. Exercise for at least 30 minutes on 5 or more days each week.  Do not use any products that contain nicotine or tobacco, such as cigarettes, e-cigarettes, and chewing tobacco. If you need help quitting, ask your health care provider.  If you are sexually active, practice safe sex. Use a condom or other form of birth control (contraception) in order to prevent pregnancy and STIs (sexually transmitted infections).  If told by your health care provider, take low-dose aspirin daily starting at age 46. What's  next?  Visit your health care provider once a year for a well check visit.  Ask your health care provider how often you should have your eyes and teeth checked.  Stay up to date on all vaccines. This information is not intended to replace advice given to you by your health care provider. Make sure you discuss any questions you have with your health care provider. Document Revised: 05/17/2018 Document Reviewed: 05/17/2018 Elsevier Patient Education  [place] EC 81 mg daily Flaxseed oil daily Melatonin 2-5 mg at bedtime  https://garcia.net/ [employer]oxic[employer]pl NOW company at [employer] and [employer], look at the Hemp+ product  The mRNA technology has been in development for 20 years and we already had the Coronavirus family of viruses (which usually just cause the common cold) genetically mapped already which is why we were able to come up with viable vaccine candidates so quickly in stage 1, then stage 2 scientifically took the correct amount of time what we did to speed it up was just build the manufacturing platform at the same time we were running the experiments so  if it worked we could produce faster. And stage 3 has now had many months and millions of people immunized and we are seeing the immunity hold for over 9 months now with sign of it dissipating and no significant numbers of adverse reactions.  During every flu season we see 2 anaphylactic reactions for every million shots given and we initially thought we would see 11 per million with the COVID vaccine but now we see only 2-3 with Moderna and 5 or so with [place] so compared to someone is dying every 20 minutes from [place] and more deadly and infectious strains are coming it is definitely best when weighing the risks and benefits to take the shots.  Another pooled analysis of the 5 most utilized vaccines in the world shows that after full immunization so far no one has died from  [place].

## 2019-12-04 DIAGNOSIS — N952 Postmenopausal atrophic vaginitis: Secondary | ICD-10-CM | POA: Insufficient documentation

## 2019-12-04 LAB — LIPID PANEL
Cholesterol: 188 mg/dL (ref 0–200)
HDL: 68.5 mg/dL (ref >39.00)
LDL Cholesterol: 102 mg/dL — ABNORMAL HIGH (ref 0–99)
NonHDL: 119.02
Total CHOL/HDL Ratio: 3
Triglycerides: 87 mg/dL (ref 0.0–149.0)
VLDL: 17.4 mg/dL (ref 0.0–40.0)

## 2019-12-04 LAB — TSH: TSH: 2.62 u[IU]/mL (ref 0.35–4.50)

## 2019-12-04 LAB — CBC
HCT: 37.7 % (ref 36.0–46.0)
Hemoglobin: 12.6 g/dL (ref 12.0–15.0)
MCHC: 33.5 g/dL (ref 30.0–36.0)
MCV: 90.3 fl (ref 78.0–100.0)
Platelets: 247 10*3/uL (ref 150.0–400.0)
RBC: 4.18 Mil/uL (ref 3.87–5.11)
RDW: 13.4 % (ref 11.5–15.5)
WBC: 6.8 10*3/uL (ref 4.0–10.5)

## 2019-12-04 LAB — HEMOGLOBIN A1C: Hgb A1c MFr Bld: 6.2 % (ref 4.6–6.5)

## 2019-12-04 LAB — COMPREHENSIVE METABOLIC PANEL
ALT: 28 U/L (ref 0–35)
AST: 26 U/L (ref 0–37)
Albumin: 4.3 g/dL (ref 3.5–5.2)
Alkaline Phosphatase: 53 U/L (ref 39–117)
BUN: 11 mg/dL (ref 6–23)
CO2: 30 mEq/L (ref 19–32)
Calcium: 9.5 mg/dL (ref 8.4–10.5)
Chloride: 103 mEq/L (ref 96–112)
Creatinine, Ser: 0.71 mg/dL (ref 0.40–1.20)
GFR: 85.92 mL/min (ref >60.00)
Glucose, Bld: 101 mg/dL — ABNORMAL HIGH (ref 70–99)
Potassium: 4.3 mEq/L (ref 3.5–5.1)
Sodium: 140 mEq/L (ref 135–145)
Total Bilirubin: 0.3 mg/dL (ref 0.2–1.2)
Total Protein: 7.2 g/dL (ref 6.0–8.3)

## 2019-12-04 NOTE — Assessment & Plan Note (Signed)
Patient encouraged to maintain heart healthy diet, regular exercise, adequate sleep. Consider daily probiotics. Take medications as prescribed. Labs ordered and reviewed 

## 2019-12-04 NOTE — Assessment & Plan Note (Signed)
Has been stable on current meds. Will allow to continue, contract and UDS updated today

## 2019-12-04 NOTE — Progress Notes (Signed)
Subjective:    Patient ID: Sara West, female    DOB: Feb 08, 1966, 54 y.o.   MRN: [id number]  Chief Complaint  Patient presents with  . Annual Exam    HPI Patient is in today for annual preventative exam and follow up on chronic medical concerns. She notes she has had a very stressful year with her daughter's anxiety and eating disorder getting very out of control. She has had to stop working and stay home with her kids during the pandemic. She tried stopping the Olanzapine because she was frustrated with weight gain but she had a flare in her own eating disorder, obsessive thinking, OCD  Behaviors and anxiety. She has chosen to restart it and already the obsessive thoughts are improving. She has not had any recent febrile illness or hospitalizations. She has been trying to eat a heart healthy diet and stay active.   Past Medical History:  Diagnosis Date  . Abnormal liver function test 02/06/2017  . Abnormal thyroid blood test 02/06/2017  . Anxiety 05/22/2007  . Bipolar disorder 05/22/2007  . Fatigue 10/28/2016  . Leg pain, bilateral 03/05/2013  . Plantar fasciitis, bilateral 03/05/2013  . Psoriasis 04/24/2007  . Weight gain 01/19/2014    Past Surgical History:  Procedure Laterality Date  . CESAREAN SECTION     x2 requiring blood transfusion (BTL with 2nd one)  . CHOLECYSTECTOMY    . TONSILECTOMY, ADENOIDECTOMY, BILATERAL MYRINGOTOMY AND TUBES      Family History  Problem Relation Age of Onset  . Alcohol abuse Mother   . Cancer Mother        lung, smoker  . Arthritis Sister        walker, uses for balance issues  . Anorexia nervosa Daughter     Social History   Socioeconomic History  . Marital status: Married    Spouse name: Not on file  . Number of children: Not on file  . Years of education: Not on file  . Highest education level: Not on file  Occupational History  . Occupation: free lance for [employer]  Tobacco Use  . Smoking status: Former Smoker   Types: Cigarettes    Quit date: 11/19/2005    Years since quitting: 14.0  . Smokeless tobacco: Never Used  Substance and Sexual Activity  . Alcohol use: Yes  . Drug use: No  . Sexual activity: Not on file  Other Topics Concern  . Not on file  Social History Narrative  . Not on file   Social Determinants of Health   Financial Resource Strain:   . Difficulty of Paying Living Expenses:   Food Insecurity:   . Worried About Programme researcher, broadcasting/film/video in the Last Year:   . Barista in the Last Year:   Transportation Needs:   . Freight forwarder (Medical):   Marland Kitchen Lack of Transportation (Non-Medical):   Physical Activity:   . Days of Exercise per Week:   . Minutes of Exercise per Session:   Stress:   . Feeling of Stress :   Social Connections:   . Frequency of Communication with Friends and Family:   . Frequency of Social Gatherings with Friends and Family:   . Attends Religious Services:   . Active Member of Clubs or Organizations:   . Attends Banker Meetings:   Marland Kitchen Marital Status:   Intimate Partner Violence:   . Fear of Current or Ex-Partner:   . Emotionally Abused:   .  Physically Abused:   . Sexually Abused:     Outpatient Medications Prior to Visit  Medication Sig Dispense Refill  . ALPRAZolam (XANAX) 0.25 MG tablet TAKE 1/2 TO 2 TABLETS BY MOUTH TWICE DAILY AS NEEDED FOR ANXIETY/INSOMNIA 90 tablet 0  . nitroGLYCERIN (NITROSTAT) 0.4 MG SL tablet Place 1 tablet (0.4 mg total) every 5 (five) minutes as needed under the tongue for chest pain. 25 tablet 1  . omeprazole (PRILOSEC) 40 MG capsule Take 40 mg by mouth 2 (two) times daily.    Marland Kitchen amphetamine-dextroamphetamine (ADDERALL) 10 MG tablet Take 1 tablet (10 mg total) by mouth daily. November 2020 30 tablet 0  . amphetamine-dextroamphetamine (ADDERALL) 10 MG tablet Take 1 tablet (10 mg total) by mouth daily. October 2020 30 tablet 0  . amphetamine-dextroamphetamine (ADDERALL) 10 MG tablet Take 1 tablet (10 mg  total) by mouth daily. December 2020 30 tablet 0  . citalopram (CELEXA) 20 MG tablet TAKE 1 AND 1/2 TABLETS BY MOUTH DAILY. 135 tablet 1  . HYDROcodone-acetaminophen (NORCO) 5-325 MG tablet Take 1 tablet by mouth daily as needed for severe pain. 20 tablet 0  . Multiple Vitamins-Minerals (WOMENS MULTIVITAMIN PLUS) TABS Take 1 each by mouth daily. ALIVE NATURAL MVI.    Marland Kitchen sulfamethoxazole-trimethoprim (BACTRIM DS) 800-160 MG tablet Take 1 tablet by mouth 2 (two) times daily. 6 tablet 0   No facility-administered medications prior to visit.    No Known Allergies  Review of Systems  Constitutional: Positive for malaise/fatigue. Negative for chills and fever.  HENT: Negative for congestion and hearing loss.   Eyes: Negative for discharge.  Respiratory: Negative for cough, sputum production and shortness of breath.   Cardiovascular: Negative for chest pain, palpitations and leg swelling.  Gastrointestinal: Negative for abdominal pain, blood in stool, constipation, diarrhea, heartburn, nausea and vomiting.  Genitourinary: Negative for dysuria, frequency, hematuria and urgency.  Musculoskeletal: Negative for back pain, falls and myalgias.  Skin: Negative for rash.  Neurological: Negative for dizziness, sensory change, loss of consciousness, weakness and headaches.  Endo/Heme/Allergies: Negative for environmental allergies. Does not bruise/bleed easily.  Psychiatric/Behavioral: Positive for depression. Negative for suicidal ideas. The patient is nervous/anxious. The patient does not have insomnia.        Objective:    Physical Exam Constitutional:      General: She is not in acute distress.    Appearance: She is not diaphoretic.  HENT:     Head: Normocephalic and atraumatic.     Right Ear: External ear normal.     Left Ear: External ear normal.     Nose: Nose normal.     Mouth/Throat:     Pharynx: No oropharyngeal exudate.  Eyes:     General: No scleral icterus.       Right eye: No  discharge.        Left eye: No discharge.     Conjunctiva/sclera: Conjunctivae normal.     Pupils: Pupils are equal, round, and reactive to light.  Neck:     Thyroid: No thyromegaly.  Cardiovascular:     Rate and Rhythm: Normal rate and regular rhythm.     Heart sounds: Normal heart sounds. No murmur.  Pulmonary:     Effort: Pulmonary effort is normal. No respiratory distress.     Breath sounds: Normal breath sounds. No wheezing or rales.  Abdominal:     General: Bowel sounds are normal. There is no distension.     Palpations: Abdomen is soft. There is no mass.  Tenderness: There is no abdominal tenderness.  Musculoskeletal:        General: No tenderness. Normal range of motion.     Cervical back: Normal range of motion and neck supple.  Lymphadenopathy:     Cervical: No cervical adenopathy.  Skin:    General: Skin is warm and dry.     Findings: No rash.  Neurological:     Mental Status: She is alert and oriented to person, place, and time.     Cranial Nerves: No cranial nerve deficit.     Coordination: Coordination normal.     Deep Tendon Reflexes: Reflexes are normal and symmetric. Reflexes normal.     BP 140/80 (BP Location: Right Arm, Cuff Size: Normal)   Pulse 69   Temp (!) 97.1 F (36.2 C) (Temporal)   Resp 12   Ht 5\' 5"  (1.651 m)   Wt 153 lb 9.6 oz (69.7 kg)   SpO2 100%   BMI 25.56 kg/m  Wt Readings from Last 3 Encounters:  12/03/19 153 lb 9.6 oz (69.7 kg)  10/25/18 152 lb 6.4 oz (69.1 kg)  07/10/18 158 lb 12.8 oz (72 kg)    Diabetic Foot Exam - Simple   No data filed     Lab Results  Component Value Date   WBC 6.8 12/03/2019   HGB 12.6 12/03/2019   HCT 37.7 12/03/2019   PLT 247.0 12/03/2019   GLUCOSE 101 (H) 12/03/2019   CHOL 188 12/03/2019   TRIG 87.0 12/03/2019   HDL 68.50 12/03/2019   LDLCALC 102 (H) 12/03/2019   ALT 28 12/03/2019   AST 26 12/03/2019   NA 140 12/03/2019   K 4.3 12/03/2019   CL 103 12/03/2019   CREATININE 0.71  12/03/2019   BUN 11 12/03/2019   CO2 30 12/03/2019   TSH 2.62 12/03/2019   HGBA1C 6.2 12/03/2019    Lab Results  Component Value Date   TSH 2.62 12/03/2019   Lab Results  Component Value Date   WBC 6.8 12/03/2019   HGB 12.6 12/03/2019   HCT 37.7 12/03/2019   MCV 90.3 12/03/2019   PLT 247.0 12/03/2019   Lab Results  Component Value Date   NA 140 12/03/2019   K 4.3 12/03/2019   CO2 30 12/03/2019   GLUCOSE 101 (H) 12/03/2019   BUN 11 12/03/2019   CREATININE 0.71 12/03/2019   BILITOT 0.3 12/03/2019   ALKPHOS 53 12/03/2019   AST 26 12/03/2019   ALT 28 12/03/2019   PROT 7.2 12/03/2019   ALBUMIN 4.3 12/03/2019   CALCIUM 9.5 12/03/2019   GFR 85.92 12/03/2019   Lab Results  Component Value Date   CHOL 188 12/03/2019   Lab Results  Component Value Date   HDL 68.50 12/03/2019   Lab Results  Component Value Date   LDLCALC 102 (H) 12/03/2019   Lab Results  Component Value Date   TRIG 87.0 12/03/2019   Lab Results  Component Value Date   CHOLHDL 3 12/03/2019   Lab Results  Component Value Date   HGBA1C 6.2 12/03/2019       Assessment & Plan:   Problem List Items Addressed This Visit    Palpitations   Relevant Orders   CBC (Completed)   TSH (Completed)   Routine general medical examination at a health care facility - Primary    Patient encouraged to maintain heart healthy diet, regular exercise, adequate sleep. Consider daily probiotics. Take medications as prescribed. Labs ordered and reviewed.  Abnormal thyroid blood test   Relevant Orders   TSH (Completed)   Hyperglycemia    minimize simple carbs. Increase exercise as tolerated.       Relevant Orders   Comprehensive metabolic panel (Completed)   Lipid panel (Completed)   Hemoglobin A1c (Completed)   Bipolar disorder, unspecified (HCC)    She has been having more trouble since being off Olanzapine. More obsessive thoughts and OCD behaviors. She restarted some Olanzapine she had left over  last week and she feels like it has helped some. She has been really depressed since she had to quit her job to take care of two kids with home schooling. Seeing a therapist. Mr Arville Go. Seems to be helping.       Renal insufficiency    Hydrate and monitor      Relevant Orders   Comprehensive metabolic panel (Completed)   Attention or concentration deficit    Has been stable on current meds. Will allow to continue, contract and UDS updated today      Relevant Orders   Pain Mgmt, Profile 8 w/Conf, U   Atrophic vaginitis    Coitus has become painful with the vaginal mucosa splitting. Will try premarin cream small amount to the mucosa twice a week. She is aware of the risks and benefits and chooses to proceed.        Other Visit Diagnoses    High risk medication use       Relevant Orders   Pain Mgmt, Profile 8 w/Conf, U      I have discontinued Lasonya A. Lococo's Womens Multivitamin Plus, HYDROcodone-acetaminophen, and sulfamethoxazole-trimethoprim. I have also changed her amphetamine-dextroamphetamine, amphetamine-dextroamphetamine, amphetamine-dextroamphetamine, and citalopram. Additionally, I am having her start on OLANZapine and Premarin. Lastly, I am having her maintain her nitroGLYCERIN, ALPRAZolam, and omeprazole.  Meds ordered this encounter  Medications  . OLANZapine (ZYPREXA) 2.5 MG tablet    Sig: Take 1 tablet (2.5 mg total) by mouth at bedtime.    Dispense:  30 tablet    Refill:  3  . conjugated estrogens (PREMARIN) vaginal cream    Sig: Place 1 Applicatorful vaginally 2 (two) times a week.    Dispense:  42.5 g    Refill:  3  . amphetamine-dextroamphetamine (ADDERALL) 10 MG tablet    Sig: Take 1 tablet (10 mg total) by mouth daily. May 2021    Dispense:  30 tablet    Refill:  0  . amphetamine-dextroamphetamine (ADDERALL) 10 MG tablet    Sig: Take 1 tablet (10 mg total) by mouth daily. April 2021    Dispense:  30 tablet    Refill:  0  . amphetamine-dextroamphetamine  (ADDERALL) 10 MG tablet    Sig: Take 1 tablet (10 mg total) by mouth daily. March 2021    Dispense:  30 tablet    Refill:  0  . citalopram (CELEXA) 20 MG tablet    Sig: Take 1 tablet (20 mg total) by mouth daily.    Dispense:  90 tablet    Refill:  1     Danise Edge, MD

## 2019-12-04 NOTE — Assessment & Plan Note (Signed)
Hydrate and monitor 

## 2019-12-04 NOTE — Assessment & Plan Note (Signed)
minimize simple carbs. Increase exercise as tolerated.  

## 2019-12-04 NOTE — Assessment & Plan Note (Addendum)
Coitus has become painful with the vaginal mucosa splitting. Will try premarin cream small amount to the mucosa twice a week. She is aware of the risks and benefits and chooses to proceed.

## 2019-12-05 LAB — PAIN MGMT, PROFILE 8 W/CONF, U
6 Acetylmorphine: NEGATIVE ng/mL
Alcohol Metabolites: NEGATIVE ng/mL (ref <500)
Amphetamine: 1504 ng/mL
Amphetamines: POSITIVE ng/mL
Benzodiazepines: NEGATIVE ng/mL
Buprenorphine, Urine: NEGATIVE ng/mL
Cocaine Metabolite: NEGATIVE ng/mL
Creatinine: 49.4 mg/dL
MDMA: NEGATIVE ng/mL
Marijuana Metabolite: 217 ng/mL
Marijuana Metabolite: POSITIVE ng/mL
Methamphetamine: NEGATIVE ng/mL
Opiates: NEGATIVE ng/mL
Oxidant: NEGATIVE ug/mL
Oxycodone: NEGATIVE ng/mL
pH: 5.3 (ref 4.5–9.0)

## 2019-12-11 DIAGNOSIS — F411 Generalized anxiety disorder: Secondary | ICD-10-CM | POA: Diagnosis not present

## 2019-12-24 DIAGNOSIS — F419 Anxiety disorder, unspecified: Secondary | ICD-10-CM | POA: Diagnosis not present

## 2019-12-26 DIAGNOSIS — F411 Generalized anxiety disorder: Secondary | ICD-10-CM | POA: Diagnosis not present

## 2019-12-31 DIAGNOSIS — F411 Generalized anxiety disorder: Secondary | ICD-10-CM | POA: Diagnosis not present

## 2020-01-07 DIAGNOSIS — F411 Generalized anxiety disorder: Secondary | ICD-10-CM | POA: Diagnosis not present

## 2020-01-13 DIAGNOSIS — F419 Anxiety disorder, unspecified: Secondary | ICD-10-CM | POA: Diagnosis not present

## 2020-01-14 ENCOUNTER — Other Ambulatory Visit: Payer: Self-pay | Admitting: Family Medicine

## 2020-01-14 DIAGNOSIS — F411 Generalized anxiety disorder: Secondary | ICD-10-CM | POA: Diagnosis not present

## 2020-01-21 ENCOUNTER — Other Ambulatory Visit: Payer: Self-pay | Admitting: Family Medicine

## 2020-01-21 ENCOUNTER — Telehealth: Payer: Self-pay | Admitting: *Deleted

## 2020-01-21 MED ORDER — ALPRAZOLAM 0.25 MG PO TABS: ORAL_TABLET | ORAL | 0 refills | Status: DC

## 2020-01-21 MED FILL — ALPRAZolam 0.25 MG TABS: 0.25 | 23 days supply | Qty: 90 | Fill #0

## 2020-01-21 NOTE — Telephone Encounter (Signed)
[facility] requesting refill for alprazolam  Last written: 11/12/18  Last ov: 12/03/19 Next ov: none Contract: 12/03/19 UDS: 12/03/19

## 2020-01-21 NOTE — Telephone Encounter (Signed)
Sent in

## 2020-01-29 ENCOUNTER — Encounter: Payer: Self-pay | Admitting: Family Medicine

## 2020-02-26 DIAGNOSIS — F411 Generalized anxiety disorder: Secondary | ICD-10-CM | POA: Diagnosis not present

## 2020-02-28 ENCOUNTER — Encounter: Payer: Self-pay | Admitting: Cardiovascular Disease

## 2020-03-03 ENCOUNTER — Encounter: Payer: Self-pay | Admitting: Obstetrics & Gynecology

## 2020-03-04 ENCOUNTER — Other Ambulatory Visit: Payer: Self-pay | Admitting: Family Medicine

## 2020-03-04 ENCOUNTER — Encounter: Payer: Self-pay | Admitting: Family Medicine

## 2020-03-04 DIAGNOSIS — F411 Generalized anxiety disorder: Secondary | ICD-10-CM | POA: Diagnosis not present

## 2020-03-04 MED ORDER — AMPHETAMINE-DEXTROAMPHETAMINE 10 MG PO TABS: 10.0000 mg | ORAL_TABLET | Freq: Every day | ORAL | 0 refills | Status: DC

## 2020-03-04 NOTE — Telephone Encounter (Signed)
Last written: 12/03/19 Last ov: 12/03/19 Next ov: none Contract: 12/03/19 UDS: 12/03/19

## 2020-03-12 DIAGNOSIS — F411 Generalized anxiety disorder: Secondary | ICD-10-CM | POA: Diagnosis not present

## 2020-03-18 DIAGNOSIS — F411 Generalized anxiety disorder: Secondary | ICD-10-CM | POA: Diagnosis not present

## 2020-03-25 DIAGNOSIS — F411 Generalized anxiety disorder: Secondary | ICD-10-CM | POA: Diagnosis not present

## 2020-04-05 ENCOUNTER — Other Ambulatory Visit: Payer: Self-pay | Admitting: Family Medicine

## 2020-04-06 MED ORDER — AMPHETAMINE-DEXTROAMPHETAMINE 10 MG PO TABS: 10.0000 mg | ORAL_TABLET | Freq: Every day | ORAL | 0 refills | Status: DC

## 2020-04-06 NOTE — Telephone Encounter (Signed)
Last written: 03/04/20 Last ov: 12/03/19 Next ov: none Contract: 12/03/19 UDS: 12/03/19

## 2020-04-06 NOTE — Telephone Encounter (Signed)
I refilled but she will need a f/u appt by Sept to keep refills

## 2020-04-21 DIAGNOSIS — F411 Generalized anxiety disorder: Secondary | ICD-10-CM | POA: Diagnosis not present

## 2020-04-23 DIAGNOSIS — F411 Generalized anxiety disorder: Secondary | ICD-10-CM | POA: Diagnosis not present

## 2020-04-28 ENCOUNTER — Emergency Department (HOSPITAL_COMMUNITY)
Admission: EM | Admit: 2020-04-28 | Discharge: 2020-04-29 | Disposition: A | Discharge status: Other Acute Inpatient Hospital | Payer: BC Managed Care – PPO | Attending: Emergency Medicine | Admitting: Emergency Medicine

## 2020-04-28 ENCOUNTER — Encounter: Payer: Self-pay | Admitting: Family Medicine

## 2020-04-28 ENCOUNTER — Other Ambulatory Visit: Payer: Self-pay

## 2020-04-28 ENCOUNTER — Encounter (HOSPITAL_COMMUNITY): Payer: Self-pay | Admitting: Emergency Medicine

## 2020-04-28 DIAGNOSIS — R45851 Suicidal ideations: Secondary | ICD-10-CM | POA: Insufficient documentation

## 2020-04-28 DIAGNOSIS — Z87891 Personal history of nicotine dependence: Secondary | ICD-10-CM | POA: Insufficient documentation

## 2020-04-28 DIAGNOSIS — Z20822 Contact with and (suspected) exposure to covid-19: Secondary | ICD-10-CM | POA: Insufficient documentation

## 2020-04-28 DIAGNOSIS — F329 Major depressive disorder, single episode, unspecified: Secondary | ICD-10-CM | POA: Insufficient documentation

## 2020-04-28 DIAGNOSIS — T424X2A Poisoning by benzodiazepines, intentional self-harm, initial encounter: Secondary | ICD-10-CM | POA: Diagnosis not present

## 2020-04-28 DIAGNOSIS — F332 Major depressive disorder, recurrent severe without psychotic features: Secondary | ICD-10-CM | POA: Diagnosis not present

## 2020-04-28 DIAGNOSIS — Z79899 Other long term (current) drug therapy: Secondary | ICD-10-CM | POA: Diagnosis not present

## 2020-04-28 DIAGNOSIS — R519 Headache, unspecified: Secondary | ICD-10-CM | POA: Diagnosis not present

## 2020-04-28 LAB — COMPREHENSIVE METABOLIC PANEL
ALT: 89 U/L — ABNORMAL HIGH (ref 0–44)
AST: 41 U/L (ref 15–41)
Albumin: 4.1 g/dL (ref 3.5–5.0)
Alkaline Phosphatase: 62 U/L (ref 38–126)
Anion gap: 9 (ref 5–15)
BUN: 13 mg/dL (ref 6–20)
CO2: 28 mmol/L (ref 22–32)
Calcium: 9.5 mg/dL (ref 8.9–10.3)
Chloride: 101 mmol/L (ref 98–111)
Creatinine, Ser: 0.7 mg/dL (ref 0.44–1.00)
GFR calc Af Amer: >60 mL/min (ref >60)
GFR calc non Af Amer: >60 mL/min (ref >60)
Glucose, Bld: 89 mg/dL (ref 70–99)
Potassium: 4.3 mmol/L (ref 3.5–5.1)
Sodium: 138 mmol/L (ref 135–145)
Total Bilirubin: 0.4 mg/dL (ref 0.3–1.2)
Total Protein: 7 g/dL (ref 6.5–8.1)

## 2020-04-28 LAB — CBC
HCT: 39.3 % (ref 36.0–46.0)
Hemoglobin: 12.7 g/dL (ref 12.0–15.0)
MCH: 30.2 pg (ref 26.0–34.0)
MCHC: 32.3 g/dL (ref 30.0–36.0)
MCV: 93.6 fL (ref 80.0–100.0)
Platelets: 224 10*3/uL (ref 150–400)
RBC: 4.2 MIL/uL (ref 3.87–5.11)
RDW: 12.7 % (ref 11.5–15.5)
WBC: 6.6 10*3/uL (ref 4.0–10.5)
nRBC: 0 % (ref 0.0–0.2)

## 2020-04-28 LAB — I-STAT BETA HCG BLOOD, ED (MC, WL, AP ONLY): I-stat hCG, quantitative: <5 m[IU]/mL (ref <5)

## 2020-04-28 LAB — SALICYLATE LEVEL: Salicylate Lvl: <7 mg/dL — ABNORMAL LOW (ref 7.0–30.0)

## 2020-04-28 LAB — RAPID URINE DRUG SCREEN, HOSP PERFORMED
Amphetamines: NOT DETECTED
Barbiturates: NOT DETECTED
Benzodiazepines: POSITIVE — AB
Cocaine: NOT DETECTED
Opiates: NOT DETECTED
Tetrahydrocannabinol: POSITIVE — AB

## 2020-04-28 LAB — ETHANOL: Alcohol, Ethyl (B): <10 mg/dL (ref <10)

## 2020-04-28 LAB — ACETAMINOPHEN LEVEL: Acetaminophen (Tylenol), Serum: <10 ug/mL — ABNORMAL LOW (ref 10–30)

## 2020-04-28 NOTE — ED Triage Notes (Signed)
Patient reports suicidal ideation and depression this week , denies hallucinations , patient also stated that she took 10 tabs of Xanax last night , alert and oriented /respirations unlabored , denies pain/calm .

## 2020-04-29 DIAGNOSIS — F332 Major depressive disorder, recurrent severe without psychotic features: Secondary | ICD-10-CM | POA: Insufficient documentation

## 2020-04-29 DIAGNOSIS — R45851 Suicidal ideations: Secondary | ICD-10-CM | POA: Diagnosis not present

## 2020-04-29 DIAGNOSIS — F259 Schizoaffective disorder, unspecified: Secondary | ICD-10-CM | POA: Diagnosis not present

## 2020-04-29 DIAGNOSIS — Z87891 Personal history of nicotine dependence: Secondary | ICD-10-CM | POA: Diagnosis not present

## 2020-04-29 DIAGNOSIS — F1020 Alcohol dependence, uncomplicated: Secondary | ICD-10-CM | POA: Diagnosis not present

## 2020-04-29 DIAGNOSIS — F411 Generalized anxiety disorder: Secondary | ICD-10-CM | POA: Diagnosis not present

## 2020-04-29 DIAGNOSIS — Z62810 Personal history of physical and sexual abuse in childhood: Secondary | ICD-10-CM | POA: Diagnosis not present

## 2020-04-29 DIAGNOSIS — Z79899 Other long term (current) drug therapy: Secondary | ICD-10-CM | POA: Diagnosis not present

## 2020-04-29 DIAGNOSIS — K219 Gastro-esophageal reflux disease without esophagitis: Secondary | ICD-10-CM | POA: Diagnosis not present

## 2020-04-29 DIAGNOSIS — Z20822 Contact with and (suspected) exposure to covid-19: Secondary | ICD-10-CM | POA: Diagnosis not present

## 2020-04-29 DIAGNOSIS — F329 Major depressive disorder, single episode, unspecified: Secondary | ICD-10-CM | POA: Diagnosis not present

## 2020-04-29 DIAGNOSIS — F1220 Cannabis dependence, uncomplicated: Secondary | ICD-10-CM | POA: Diagnosis not present

## 2020-04-29 DIAGNOSIS — T424X2A Poisoning by benzodiazepines, intentional self-harm, initial encounter: Secondary | ICD-10-CM | POA: Diagnosis not present

## 2020-04-29 LAB — SARS CORONAVIRUS 2 BY RT PCR (HOSPITAL ORDER, PERFORMED IN ~~LOC~~ HOSPITAL LAB): SARS Coronavirus 2: NEGATIVE

## 2020-04-29 MED ORDER — IBUPROFEN 400 MG PO TABS
400.0000 mg | ORAL_TABLET | Freq: Once | ORAL | Status: AC | PRN
  Administered 2020-04-29: 400 mg via ORAL
  Filled 2020-04-29: qty 1

## 2020-04-29 MED ORDER — LORAZEPAM 1 MG PO TABS: 0.0000 mg | ORAL_TABLET | Freq: Four times a day (QID) | ORAL | Status: DC

## 2020-04-29 MED ORDER — LORAZEPAM 2 MG/ML IJ SOLN: 0.0000 mg | Freq: Four times a day (QID) | INTRAMUSCULAR | Status: DC

## 2020-04-29 MED ORDER — THIAMINE HCL 100 MG PO TABS
100.0000 mg | ORAL_TABLET | Freq: Every day | ORAL | Status: DC
  Administered 2020-04-29: 100 mg via ORAL
  Filled 2020-04-29: qty 1

## 2020-04-29 MED ORDER — ONDANSETRON HCL 4 MG PO TABS
4.0000 mg | ORAL_TABLET | Freq: Three times a day (TID) | ORAL | Status: DC | PRN
  Administered 2020-04-29: 4 mg via ORAL
  Filled 2020-04-29: qty 1

## 2020-04-29 MED ORDER — ACETAMINOPHEN 325 MG PO TABS: 650.0000 mg | ORAL_TABLET | ORAL | Status: DC | PRN

## 2020-04-29 MED ORDER — LORAZEPAM 1 MG PO TABS: 0.0000 mg | ORAL_TABLET | Freq: Two times a day (BID) | ORAL | Status: DC

## 2020-04-29 MED ORDER — LORAZEPAM 2 MG/ML IJ SOLN: 0.0000 mg | Freq: Two times a day (BID) | INTRAMUSCULAR | Status: DC

## 2020-04-29 MED ORDER — THIAMINE HCL 100 MG/ML IJ SOLN: 100.0000 mg | Freq: Every day | INTRAMUSCULAR | Status: DC

## 2020-04-29 NOTE — ED Notes (Signed)
Pt given ibuprofen, reports having a headache.

## 2020-04-29 NOTE — Progress Notes (Signed)
Pt meets inpatient criteria per Marciano Sequin, NP. Referral information has been sent to the following hospitals for review:  [facility]  [facility]  [facility]  [facility]  [facility]      Disposition will continue to follow.    Wells Guiles, LCSW, LCAS Disposition CSW [facility] [phone]

## 2020-04-29 NOTE — Progress Notes (Signed)
Pt accepted to [facility]  Dr. Theophilus Bones is the attending provider.    Call report to [phone]  Emily @ [facility] ED notified.     Pt is voluntary and will be transported by [employer]  Pt is scheduled to arrive at [facility] as soon as transportation can be arranged.    Wells Guiles, LCSW, LCAS Disposition CSW [facility] [phone]

## 2020-04-29 NOTE — BH Assessment (Signed)
Comprehensive Clinical Assessment (CCA) Note  04/29/2020 Sara JourneyMaura A West [id number]  Visit Diagnosis:      ICD-10-CM   1. Suicidal ideation  R45.851   2. Intentional benzodiazepine overdose, initial encounter (HCC)  T42.4X2A     Major Depressive Disorder, Recurrent, Severe w/o psychotic features  CCA Screening, Triage and Referral (STR)  Patient Reported Information How did you hear about us? Self  Referral name: Sara West  Referral phone number: No data recorded  Whom do you see for routine medical problems? Primary Care  Practice/Facility Name: [facility]  Practice/Facility Phone Number: No data recorded Name of Contact: Dr. Sharyne RichtersBlythe  Contact Number: No data recorded Contact Fax Number: No data recorded Prescriber Name: Dr. Rogelia RohrerBlythe  Prescriber Address (if known): No data recorded  What Is the Reason for Your Visit/Call Today? Overdose on Xanax combined with alcohol use  How Long Has This Been Causing You Problems? 1-6 months  What Do You Feel Would Help You the Most Today? Other (Comment);Therapy (Wants to stop drinking;)   Have You Recently Been in Any Inpatient Treatment (Hospital/Detox/Crisis Center/28-Day Program)? No  Name/Location of Program/Hospital:No data recorded How Long Were You There? No data recorded When Were You Discharged? No data recorded  Have You Ever Received Services From [facility] Before? No  Who Do You See at [facility]? No data recorded  Have You Recently Had Any Thoughts About Hurting Yourself? Yes  Are You Planning to Commit Suicide/Harm Yourself At This time? No   Have you Recently Had Thoughts About Hurting Someone Sara West? No  Explanation: No data recorded  Have You Used Any Alcohol or Drugs in the Past 24 Hours? No  How Long Ago Did You Use Drugs or Alcohol? No data recorded What Did You Use and How Much? No data recorded  Do You Currently Have a Therapist/Psychiatrist? Yes  Name of  Therapist/Psychiatrist: [facility]   Have You Been Recently Discharged From Any Office Practice or Programs? No  Explanation of Discharge From Practice/Program: No data recorded    CCA Screening Triage Referral Assessment Type of Contact: Tele-Assessment  Is this Initial or Reassessment? Initial Assessment  Date Telepsych consult ordered in [facility]:  04/29/20  Time Telepsych consult ordered in [facility]:  No data recorded  Patient Reported Information Reviewed? Yes  Patient Left Without Being Seen? No data recorded Reason for Not Completing Assessment: No data recorded  Collateral Involvement: No data recorded  Does Patient Have a Court Appointed Legal Guardian? No data recorded Name and Contact of Legal Guardian: No data recorded If Minor and Not Living with Parent(s), Who has Custody? No data recorded Is CPS involved or ever been involved? Never  Is APS involved or ever been involved? Never   Patient Determined To Be At Risk for Harm To Self or Others Based on Review of Patient Reported Information or Presenting Complaint? Yes, for Self-Harm  Method: No data recorded Availability of Means: No data recorded Intent: No data recorded Notification Required: No data recorded Additional Information for Danger to Others Potential: No data recorded Additional Comments for Danger to Others Potential: No data recorded Are There Guns or Other Weapons in Your Home? No data recorded Types of Guns/Weapons: No data recorded Are These Weapons Safely Secured?                            No data recorded Who Could Verify You Are Able To Have  These Secured: No data recorded Do You Have any Outstanding Charges, Pending Court Dates, Parole/Probation? No data recorded Contacted To Inform of Risk of Harm To Self or Others: No data recorded  Location of Assessment: [facility] ED   Does Patient Present under Involuntary Commitment? No  IVC Papers Initial File Date: No data recorded  [place] of  Residence: [place]   Patient Currently Receiving the Following Services: Medication Management;Individual Therapy   Determination of Need: Emergent (2 hours)   Options For Referral: Inpatient Hospitalization     CCA Biopsychosocial  Intake/Chief Complaint:  CCA Intake With Chief Complaint Chief Complaint/Presenting Problem: Pt intentionally overdosed on alcohol and Xanax in self-described suicide attempt Patient's Currently Reported Symptoms/Problems: Despondency; poor appetite; tearfulness; isolation; impulsivity Individual's Strengths: Supportive husband; fair insight Individual's Preferences: Inpatient Type of Services Patient Feels Are Needed: Inpatient  Pt is a 54 year old female who presented to [facility] on voluntary basis following an intentional overdose on 04/27/2020.  Pt lives in [place] with husband and children, and she works part-time.  Pt receives outpatient therapy services through the [facility], and she is prescribed psychotropic medication through a Dr. Rogelia Rohrer, a PCP with [facility].    Pt reported that she has a history of depression, and that on 04/27/2020, she intentionally overdosed on 10 tabs of Xanax and three glasses of wine following an argument with her husband.  Pt described the overdose as a suicide attempt.  Pt reported that she overdosed in October 2020 as well, but that she simply collapsed and fell asleep.  Pt endorsed a history of persistent and unremitting despondency, disturbed appetite, feelings of hopelessness and worthlessness, low energy, and suicidal ideation (Pt denied current suicidal ideation).  Pt also endorsed daily use of alcohol -- up to three bottles of wine at a time; daily use of marijuana; and episodic use of adderall, which she crushes and snorts.  Pt also endorsed a history of self-injury.  Pt cut herself last about two days ago.  Mental Health Symptoms Depression:  Depression: Fatigue, Hopelessness, Tearfulness,  Worthlessness, Weight gain/loss, Duration of symptoms greater than two weeks  Mania:  Mania: None  Anxiety:   Anxiety: None  Psychosis:  Psychosis: None  Trauma:  Trauma: None  Obsessions:  Obsessions: None  Compulsions:  Compulsions: None  Inattention:  Inattention: None  Hyperactivity/Impulsivity:  Hyperactivity/Impulsivity: N/A  Oppositional/Defiant Behaviors:  Oppositional/Defiant Behaviors: None  Emotional Irregularity:  Emotional Irregularity: Intense/unstable relationships, Mood lability, Potentially harmful impulsivity, Recurrent suicidal behaviors/gestures/threats  Other Mood/Personality Symptoms:      Mental Status Exam Appearance and self-care  Stature:  Stature: Average  Weight:  Weight: Average weight  Clothing:  Clothing: Casual  Grooming:  Grooming: Normal  Cosmetic use:  Cosmetic Use: None  Posture/gait:  Posture/Gait: Normal  Motor activity:  Motor Activity: Not Remarkable  Sensorium  Attention:  Attention: Normal  Concentration:  Concentration: Normal  Orientation:  Orientation: X5  Recall/memory:  Recall/Memory: Normal  Affect and Mood  Affect:  Affect: Blunted  Mood:  Mood: Depressed  Relating  Eye contact:  Eye Contact: Normal  Facial expression:  Facial Expression: Responsive  Attitude toward examiner:  Attitude Toward Examiner: Cooperative  Thought and Language  Speech flow: Speech Flow: Clear and Coherent  Thought content:  Thought Content: Appropriate to Mood and Circumstances  Preoccupation:  Preoccupations: None  Hallucinations:  Hallucinations: None  Organization:     Company secretary of Knowledge:  Fund of Knowledge: Average  Intelligence:  Intelligence:  Average  Abstraction:  Abstraction: Normal  Judgement:  Judgement: Poor  Reality Testing:  Reality Testing: Adequate, Realistic  Insight:  Insight: Fair  Decision Making:  Decision Making: Impulsive  Social Functioning  Social Maturity:  Social Maturity: Impulsive  Social  Judgement:  Social Judgement: Normal  Stress  Stressors:  Stressors: Family conflict, Relationship  Coping Ability:  Coping Ability: Building surveyor Deficits:  Skill Deficits: Decision making  Supports:  Supports: Friends/Service system     Religion:    Leisure/Recreation:    Exercise/Diet: Exercise/Diet Have You Gained or Lost A Significant Amount of Weight in the Past Six Months?: Yes-Gained Number of Pounds Gained: 20 Do You Follow a Special Diet?: No Do You Have Any Trouble Sleeping?: No   CCA Employment/Education  Employment/Work Situation: Employment / Work Situation Employment situation: Employed Where is patient currently employed?: Part-time employment  Education: Education Is Patient Currently Attending School?: No   CCA Family/Childhood History  Family and Relationship History: Family history Marital status: Married Are you sexually active?: Yes Does patient have children?: Yes  Childhood History:     Child/Adolescent Assessment:     CCA Substance Use  Alcohol/Drug Use: Alcohol / Drug Use Pain Medications: See MAR Prescriptions: See MAR Over the Counter: See MAR History of alcohol / drug use?: Yes Substance #1 Name of Substance 1: Alcohol 1 - Amount (size/oz): Varied -- up to three bottles 1 - Frequency: Daily 1 - Duration: Ongoing 1 - Last Use / Amount: 04/27/2020 -- three glasses of wine Substance #2 Name of Substance 2: Marijuana 2 - Amount (size/oz): varied 2 - Frequency: Daily 2 - Duration: Ongoing 2 - Last Use / Amount: 04/27/2020 Substance #3 Name of Substance 3: Adderall 3 - Amount (size/oz): Varied 3 - Frequency: episodic 3 - Duration: Ongoing 3 - Last Use / Amount: Not sure -- Pt will sometimes crush and snort prescribed Adderall                   ASAM's:  Six Dimensions of Multidimensional Assessment  Dimension 1:  Acute Intoxication and/or Withdrawal Potential:      Dimension 2:  Biomedical Conditions  and Complications:      Dimension 3:  Emotional, Behavioral, or Cognitive Conditions and Complications:     Dimension 4:  Readiness to Change:     Dimension 5:  Relapse, Continued use, or Continued Problem Potential:     Dimension 6:  Recovery/Living Environment:     ASAM Severity Score:    ASAM Recommended Level of Treatment:     Substance use Disorder (SUD)    Recommendations for Services/Supports/Treatments:    DSM5 Diagnoses: Patient Active Problem List   Diagnosis Date Noted  . Severe episode of recurrent major depressive disorder, without psychotic features (HCC)   . Atrophic vaginitis 12/04/2019  . Attention or concentration deficit 12/03/2019  . Renal insufficiency 04/04/2018  . Right hip pain 04/04/2018  . Nausea 07/31/2017  . Dizziness 07/27/2017  . High blood pressure 07/27/2017  . Atypical chest pain 07/27/2017  . Hyperglycemia 07/27/2017  . Abnormal thyroid blood test 02/06/2017  . Abnormal liver function test 02/06/2017  . Fatigue 10/28/2016  . Severe headache 04/12/2015  . Weight gain 01/19/2014  . Plantar fasciitis, bilateral 03/05/2013  . Leg pain, bilateral 03/05/2013  . Routine general medical examination at a health care facility 08/03/2011  . Palpitations 01/09/2011  . Insomnia 12/10/2009  . Calculus of gallbladder 05/22/2007  . Bipolar disorder, unspecified (HCC) 05/22/2007  .  Anxiety and depression 05/22/2007  . Psoriasis 04/24/2007    Patient Centered Plan: Patient is on the following Treatment Plan(s):     Referrals to Alternative Service(s): Referred to Alternative Service(s):   Place:   Date:   Time:    Referred to Alternative Service(s):   Place:   Date:   Time:    Referred to Alternative Service(s):   Place:   Date:   Time:    Referred to Alternative Service(s):   Place:   Date:   Time:     MSE:  During assessment, Pt presented as alert and oriented.  She had good eye contact and was cooperative.  Pt was dressed appropriately, and  demeanor was calm.  Pt's mood was depressed.  Affect was blunted.  Pt's speech was normal in rate, rhythm, and volume.  Thought processes were within normal range, and thought content was logical and goal-oriented.  There was no evidence of delusion.  Pt's memory and concentration were intact.  Insight, judgment, and impulse control were poor.    DISPOSITION:  Consulted with Rhona Raider NP, who determined that Pt meets inpatient criteria. Dorris Fetch Khilynn Borntreger

## 2020-04-29 NOTE — ED Provider Notes (Signed)
[facility] EMERGENCY DEPARTMENT Provider Note   CSN: [id number] Arrival date & time: 04/28/20  1831     History Chief Complaint  Patient presents with  . Suicidal    Overdose    Sara West is a 54 y.o. female.  54yo F w/ PMH including bipolar d/o who p/w depression and overdose.  Patient reports having progressively worsening problems with depression and suicidal ideation recently.  She reports multiple stressors including problems at home with her husband.  Lately she has been drinking during the daytime to cope with her symptoms although she knows this is not good for her.  She reports drinking heavily over the weekend and taking Xanax, staying in bed all day.  2 nights ago, she was intoxicated and having suicidal ideations, intentionally took 10 tablets of her Xanax.  She has felt nauseated since then but denies any vomiting.  She still feels hopeless and depressed.  Of note, she was previously on olanzapine but self weaned off of this medication due to the side effect of weight gain.  The history is provided by the patient.       Past Medical History:  Diagnosis Date  . Abnormal liver function test 02/06/2017  . Abnormal thyroid blood test 02/06/2017  . Anxiety 05/22/2007  . Bipolar disorder 05/22/2007  . Fatigue 10/28/2016  . Leg pain, bilateral 03/05/2013  . Plantar fasciitis, bilateral 03/05/2013  . Psoriasis 04/24/2007  . Weight gain 01/19/2014    Patient Active Problem List   Diagnosis Date Noted  . Atrophic vaginitis 12/04/2019  . Attention or concentration deficit 12/03/2019  . Renal insufficiency 04/04/2018  . Right hip pain 04/04/2018  . Nausea 07/31/2017  . Dizziness 07/27/2017  . High blood pressure 07/27/2017  . Atypical chest pain 07/27/2017  . Hyperglycemia 07/27/2017  . Abnormal thyroid blood test 02/06/2017  . Abnormal liver function test 02/06/2017  . Fatigue 10/28/2016  . Severe headache 04/12/2015  . Weight gain 01/19/2014  .  Plantar fasciitis, bilateral 03/05/2013  . Leg pain, bilateral 03/05/2013  . Routine general medical examination at a health care facility 08/03/2011  . Palpitations 01/09/2011  . Insomnia 12/10/2009  . Calculus of gallbladder 05/22/2007  . Bipolar disorder, unspecified (HCC) 05/22/2007  . Anxiety and depression 05/22/2007  . Psoriasis 04/24/2007    Past Surgical History:  Procedure Laterality Date  . CESAREAN SECTION     x2 requiring blood transfusion (BTL with 2nd one)  . CHOLECYSTECTOMY    . TONSILECTOMY, ADENOIDECTOMY, BILATERAL MYRINGOTOMY AND TUBES       OB History   No obstetric history on file.     Family History  Problem Relation Age of Onset  . Alcohol abuse Mother   . Cancer Mother        lung, smoker  . Arthritis Sister        walker, uses for balance issues  . Anorexia nervosa Daughter     Social History   Tobacco Use  . Smoking status: Former Smoker    Types: Cigarettes    Quit date: 11/19/2005    Years since quitting: 14.4  . Smokeless tobacco: Never Used  Substance Use Topics  . Alcohol use: Yes  . Drug use: No    Home Medications Prior to Admission medications   Medication Sig Start Date End Date Taking? Authorizing Provider  ALPRAZolam (XANAX) 0.25 MG tablet TAKE 1/2 TO 2 TABLETS BY MOUTH TWICE DAILY AS NEEDED FOR ANXIETY/INSOMNIA Patient taking differently: Take 0.125-0.25 mg  by mouth 2 (two) times daily as needed for anxiety or sleep.  01/21/20  Yes Bradd Canary, MD  amphetamine-dextroamphetamine (ADDERALL) 10 MG tablet Take 1 tablet (10 mg total) by mouth daily. Patient taking differently: Take 5 mg by mouth daily.  04/06/20  Yes Bradd Canary, MD  citalopram (CELEXA) 20 MG tablet TAKE 1 AND 1/2 TABLETS BY MOUTH DAILY. Patient taking differently: Take 10 mg by mouth daily.  01/14/20  Yes Bradd Canary, MD  nitroGLYCERIN (NITROSTAT) 0.4 MG SL tablet Place 1 tablet (0.4 mg total) every 5 (five) minutes as needed under the tongue for chest  pain. 07/27/17  Yes Bradd Canary, MD  omeprazole (PRILOSEC) 40 MG capsule Take 40 mg by mouth as needed (acid reflux).  11/18/19  Yes [provider]  amphetamine-dextroamphetamine (ADDERALL) 10 MG tablet Take 1 tablet (10 mg total) by mouth daily. April 2021 Patient not taking: Reported on 04/29/2020 12/03/19   Bradd Canary, MD  amphetamine-dextroamphetamine (ADDERALL) 10 MG tablet Take 1 tablet (10 mg total) by mouth daily. March 2021 Patient not taking: Reported on 04/29/2020 12/03/19   Bradd Canary, MD  conjugated estrogens (PREMARIN) vaginal cream Place 1 Applicatorful vaginally 2 (two) times a week. Patient not taking: Reported on 04/29/2020 12/05/19   Bradd Canary, MD  OLANZapine (ZYPREXA) 2.5 MG tablet Take 1 tablet (2.5 mg total) by mouth at bedtime. Patient not taking: Reported on 04/29/2020 12/03/19   Bradd Canary, MD    Allergies    Patient has no known allergies.  Review of Systems   Review of Systems  Gastrointestinal: Positive for nausea. Negative for vomiting.  Neurological: Positive for headaches.  Psychiatric/Behavioral: Positive for suicidal ideas.   All other systems reviewed and are negative except that which was mentioned in HPI  Physical Exam Updated Vital Signs BP 117/85 (BP Location: Right Arm)   Pulse 72   Temp 97.9 F (36.6 C) (Oral)   Resp 16   Ht 5\' 4"  (1.626 m)   Wt 80 kg   SpO2 99%   BMI 30.27 kg/m   Physical Exam Vitals and nursing note reviewed.  Constitutional:      General: She is not in acute distress.    Appearance: She is well-developed.  HENT:     Head: Normocephalic and atraumatic.  Eyes:     Conjunctiva/sclera: Conjunctivae normal.  Pulmonary:     Effort: Pulmonary effort is normal.  Musculoskeletal:     Cervical back: Neck supple.  Skin:    General: Skin is warm and dry.  Neurological:     Mental Status: She is alert and oriented to person, place, and time.  Psychiatric:        Attention and Perception:  Attention normal.        Mood and Affect: Mood is depressed. Affect is tearful.        Speech: Speech normal.        Behavior: Behavior normal. Behavior is cooperative.        Thought Content: Thought content includes suicidal ideation.        Cognition and Memory: Cognition and memory normal.        Judgment: Judgment is impulsive.     ED Results / Procedures / Treatments   Labs (all labs ordered are listed, but only abnormal results are displayed) Labs Reviewed  COMPREHENSIVE METABOLIC PANEL - Abnormal; Notable for the following components:      Result Value   ALT 89 (*)  All other components within normal limits  SALICYLATE LEVEL - Abnormal; Notable for the following components:   Salicylate Lvl <7.0 (*)    All other components within normal limits  ACETAMINOPHEN LEVEL - Abnormal; Notable for the following components:   Acetaminophen (Tylenol), Serum <10 (*)    All other components within normal limits  RAPID URINE DRUG SCREEN, HOSP PERFORMED - Abnormal; Notable for the following components:   Benzodiazepines POSITIVE (*)    Tetrahydrocannabinol POSITIVE (*)    All other components within normal limits  SARS CORONAVIRUS 2 BY RT PCR (HOSPITAL ORDER, PERFORMED IN [place] [facility])  ETHANOL  CBC  I-STAT BETA HCG BLOOD, ED ([facility], [facility], [facility] ONLY)    EKG None  Radiology No results found.  Procedures Procedures (including critical care time)  Medications Ordered in ED Medications  LORazepam (ATIVAN) injection 0-4 mg (has no administration in time range)    Or  LORazepam (ATIVAN) tablet 0-4 mg (has no administration in time range)  LORazepam (ATIVAN) injection 0-4 mg (has no administration in time range)    Or  LORazepam (ATIVAN) tablet 0-4 mg (has no administration in time range)  thiamine tablet 100 mg (has no administration in time range)    Or  thiamine (B-1) injection 100 mg (has no administration in time range)  acetaminophen (TYLENOL) tablet 650 mg (has no  administration in time range)  ondansetron (ZOFRAN) tablet 4 mg (has no administration in time range)  ibuprofen (ADVIL) tablet 400 mg (400 mg Oral Given 04/29/20 0421)    ED Course  I have reviewed the triage vital signs and the nursing notes.  Pertinent labs that were available during my care of the patient were reviewed by me and considered in my medical decision making (see chart for details).    MDM Rules/Calculators/A&P                          Patient was calm, tearful but appropriate and cooperative on exam.  Normal vital signs, no obvious signs of alcohol withdrawal.  Screening lab work reassuring.  Because she is greater than 24 hours out from her intentional overdose, she is medically clear.  I have consulted psychiatry team due to her worsening depression and SI, disposition will be determined by psychiatry team recommendations. Final Clinical Impression(s) / ED Diagnoses Final diagnoses:  None    Rx / DC Orders ED Discharge Orders    None       Jalin Erpelding, Ambrose [place], MD 04/29/20 [phone]

## 2020-05-08 DIAGNOSIS — F411 Generalized anxiety disorder: Secondary | ICD-10-CM | POA: Diagnosis not present

## 2020-05-11 DIAGNOSIS — F1020 Alcohol dependence, uncomplicated: Secondary | ICD-10-CM | POA: Diagnosis not present

## 2020-05-11 DIAGNOSIS — F411 Generalized anxiety disorder: Secondary | ICD-10-CM | POA: Diagnosis not present

## 2020-05-12 DIAGNOSIS — F411 Generalized anxiety disorder: Secondary | ICD-10-CM | POA: Diagnosis not present

## 2020-05-13 DIAGNOSIS — F411 Generalized anxiety disorder: Secondary | ICD-10-CM | POA: Diagnosis not present

## 2020-05-13 DIAGNOSIS — F1020 Alcohol dependence, uncomplicated: Secondary | ICD-10-CM | POA: Diagnosis not present

## 2020-05-14 DIAGNOSIS — F411 Generalized anxiety disorder: Secondary | ICD-10-CM | POA: Diagnosis not present

## 2020-05-15 DIAGNOSIS — F411 Generalized anxiety disorder: Secondary | ICD-10-CM | POA: Diagnosis not present

## 2020-05-18 DIAGNOSIS — F411 Generalized anxiety disorder: Secondary | ICD-10-CM | POA: Diagnosis not present

## 2020-05-25 DIAGNOSIS — F411 Generalized anxiety disorder: Secondary | ICD-10-CM | POA: Diagnosis not present

## 2020-05-25 DIAGNOSIS — F1020 Alcohol dependence, uncomplicated: Secondary | ICD-10-CM | POA: Diagnosis not present

## 2020-05-29 ENCOUNTER — Encounter: Payer: Self-pay | Admitting: Obstetrics & Gynecology

## 2020-05-29 ENCOUNTER — Ambulatory Visit (INDEPENDENT_AMBULATORY_CARE_PROVIDER_SITE_OTHER): Payer: BC Managed Care – PPO | Admitting: Obstetrics & Gynecology

## 2020-05-29 ENCOUNTER — Other Ambulatory Visit: Payer: Self-pay

## 2020-05-29 VITALS — BP 114/80 | HR 68 | Resp 16 | Ht 64.75 in | Wt 167.0 lb

## 2020-05-29 DIAGNOSIS — N941 Unspecified dyspareunia: Secondary | ICD-10-CM | POA: Diagnosis not present

## 2020-05-29 DIAGNOSIS — N951 Menopausal and female climacteric states: Secondary | ICD-10-CM | POA: Diagnosis not present

## 2020-05-29 MED ORDER — ESTRADIOL 0.1 MG/GM VA CREA: TOPICAL_CREAM | VAGINAL | 6 refills | Status: DC

## 2020-05-29 NOTE — Progress Notes (Addendum)
54 y.o. G64P2020 Married White or Caucasian female here for menopausal symptoms.  Her last cycle was 11/2018.  Has some hot flashes.  She finds her hot flashes managable.  She does feel they are cyclical with episodes for a few weeks that are worse and then they get better.  Last summer, she had more trouble sleeping with night sweats.  She reports this is better.  She is having a lot of issues with painful intercourse.  She feels very sharp pain with intercourse.  She is using KY jelly.  Denies vaginal bleeding.     Had an attempted suicide in early August.  Was hospitalized for 8 weeks later.  Reports her daughter has anorexia and this was such a stressors.  She's had issues with her spouse.  She drank 3 bottles of wine and took Xanax.  She is dry and reports she is in an outpatient program with the [facility].  She and her husband are in marriage counseling.   She and husband are in a good place.  Medication has been adjusted.  Feels this is good for her.  She feels very supported.           Sexually active: Yes.    The current method of family planning is tubal ligation.    Exercising: No.  exercise Smoker:  no  Health Maintenance: Pap:  07/2019 (outside records came and pap was neg with neg HR HPV 08/14/2019) History of abnormal Pap:  no MMG:  09/2018 bilateral & rt breast u/s category c density birads 1:neg Colonoscopy:  Age 51, Dr. Elnoria Howard.  This was negative.   BMD:   none TDaP:  01/2010 Pneumonia vaccine(s):  Not done Shingrix:   D/w pt today.   Hep C testing: not done Screening Labs:  04/2020   reports that she quit smoking about 14 years ago. Her smoking use included cigarettes. She has never used smokeless tobacco. She reports previous alcohol use. She reports that she does not use drugs.  Past Medical History:  Diagnosis Date  . Abnormal liver function test 02/06/2017  . Abnormal thyroid blood test 02/06/2017  . Anxiety 05/22/2007  . Bipolar disorder 05/22/2007   per patient no  .  Depression   . Fatigue 10/28/2016  . Leg pain, bilateral 03/05/2013  . Plantar fasciitis, bilateral 03/05/2013  . Psoriasis 04/24/2007  . Weight gain 01/19/2014    Past Surgical History:  Procedure Laterality Date  . CESAREAN SECTION     x2 requiring blood transfusion (BTL with 2nd one)  . CHOLECYSTECTOMY    . heart ablation     times 2  . TONSILECTOMY, ADENOIDECTOMY, BILATERAL MYRINGOTOMY AND TUBES      Current Outpatient Medications  Medication Sig Dispense Refill  . busPIRone (BUSPAR) 5 MG tablet Take 5 mg by mouth 2 (two) times daily.    . citalopram (CELEXA) 20 MG tablet TAKE 1 AND 1/2 TABLETS BY MOUTH DAILY. (Patient taking differently: Take 10 mg by mouth daily. ) 135 tablet 1  . hydrOXYzine (VISTARIL) 25 MG capsule Take 25 mg by mouth every 6 (six) hours as needed.    Marland Kitchen omeprazole (PRILOSEC) 40 MG capsule Take 40 mg by mouth as needed (acid reflux).     . traZODone (DESYREL) 50 MG tablet Take 50 mg by mouth at bedtime as needed. for sleep     No current facility-administered medications for this visit.    Family History  Problem Relation Age of Onset  . Alcohol abuse  Mother   . Cancer Mother        lung, smoker  . Arthritis Sister        walker, uses for balance issues  . Hypertension Sister   . Anorexia nervosa Daughter   . Breast cancer Paternal Aunt     Review of Systems  Constitutional:       Hot flashes, weight gain, no cycle  HENT: Negative.   Eyes: Negative.   Respiratory: Negative.   Cardiovascular: Negative.   Gastrointestinal: Negative.   Endocrine: Negative.   Genitourinary: Negative.   Musculoskeletal: Negative.   Skin: Negative.   Allergic/Immunologic: Negative.   Neurological: Negative.   Hematological: Negative.   Psychiatric/Behavioral: Negative.     Exam:   Ht 5' 4.75" (1.645 m)   Wt 167 lb (75.8 kg)   BMI 28.01 kg/m   Height: 5' 4.75" (164.5 cm)  General appearance: alert, cooperative and appears stated age Lymph nodes: No abnormal  inguinal nodes palpated   Pelvic: External genitalia:  no lesions              Urethra:  normal appearing urethra with no masses, tenderness or lesions              Bartholins and Skenes: normal                 Vagina: atrophic changes              Cervix: no lesions              Pap taken: No. Bimanual Exam:  Uterus:  normal size, contour, position, consistency, mobility, non-tender              Adnexa: normal adnexa and no mass, fullness, tenderness               Rectovaginal: Confirms               Anus:  normal sphincter tone, no lesions  Chaperone, Cornelia Copa, CMA, was present for exam.  A:  Menopausal changes including dyspareunia Vaginal atrophy Recent attempted suicide, now in couple's counseling, followed by psychiatry, feeling well H/o blood transfusion with cesarean section Family hx of breast cancer in paternal uncle  P:   Mammogram appears to be overdue.  Guidelines reviewed. pap smear 07/2020 per pt report.  Release of records signed. Release for colonoscopy signed today as well Will start vaginal estrogen cream, estrace 1 gram pv twice weekly.  Will need follow up but will wait for records before determining when needed.  Do not feel she needs HRT at this time.  32 minutes total spent with pt.

## 2020-06-01 ENCOUNTER — Encounter: Payer: Self-pay | Admitting: Obstetrics & Gynecology

## 2020-06-01 DIAGNOSIS — F411 Generalized anxiety disorder: Secondary | ICD-10-CM | POA: Diagnosis not present

## 2020-06-15 DIAGNOSIS — F411 Generalized anxiety disorder: Secondary | ICD-10-CM | POA: Diagnosis not present

## 2020-06-22 ENCOUNTER — Telehealth: Payer: Self-pay

## 2020-06-22 NOTE — Telephone Encounter (Signed)
Patient is aware her last pap was 08-14-2019 & it was neg pap HPV HR neg. She is also aware her last tetanus was 5/11. Patient asking if she needs to have a pap done with you this year since she had one last year?  Routing to Dr Hyacinth Meeker for review.

## 2020-06-22 NOTE — Telephone Encounter (Signed)
She has no hx of abnormal pap smears and had negative pap with negative HR HPV 07/2019 so she does not need a pap smear in November this year.  Thanks.

## 2020-06-22 NOTE — Telephone Encounter (Signed)
Patient notified

## 2020-06-29 DIAGNOSIS — F1020 Alcohol dependence, uncomplicated: Secondary | ICD-10-CM | POA: Diagnosis not present

## 2020-06-29 DIAGNOSIS — F411 Generalized anxiety disorder: Secondary | ICD-10-CM | POA: Diagnosis not present

## 2020-06-30 DIAGNOSIS — F411 Generalized anxiety disorder: Secondary | ICD-10-CM | POA: Diagnosis not present

## 2020-07-01 DIAGNOSIS — F411 Generalized anxiety disorder: Secondary | ICD-10-CM | POA: Diagnosis not present

## 2020-07-03 DIAGNOSIS — F411 Generalized anxiety disorder: Secondary | ICD-10-CM | POA: Diagnosis not present

## 2020-07-06 DIAGNOSIS — F1020 Alcohol dependence, uncomplicated: Secondary | ICD-10-CM | POA: Diagnosis not present

## 2020-07-06 DIAGNOSIS — F411 Generalized anxiety disorder: Secondary | ICD-10-CM | POA: Diagnosis not present

## 2020-07-07 DIAGNOSIS — F411 Generalized anxiety disorder: Secondary | ICD-10-CM | POA: Diagnosis not present

## 2020-07-08 DIAGNOSIS — F411 Generalized anxiety disorder: Secondary | ICD-10-CM | POA: Diagnosis not present

## 2020-07-10 DIAGNOSIS — F411 Generalized anxiety disorder: Secondary | ICD-10-CM | POA: Diagnosis not present

## 2020-07-13 DIAGNOSIS — F411 Generalized anxiety disorder: Secondary | ICD-10-CM | POA: Diagnosis not present

## 2020-07-14 DIAGNOSIS — F411 Generalized anxiety disorder: Secondary | ICD-10-CM | POA: Diagnosis not present

## 2020-07-15 DIAGNOSIS — F411 Generalized anxiety disorder: Secondary | ICD-10-CM | POA: Diagnosis not present

## 2020-07-19 ENCOUNTER — Other Ambulatory Visit: Payer: Self-pay

## 2020-07-19 ENCOUNTER — Encounter (HOSPITAL_COMMUNITY): Payer: Self-pay | Admitting: *Deleted

## 2020-07-19 ENCOUNTER — Ambulatory Visit (INDEPENDENT_AMBULATORY_CARE_PROVIDER_SITE_OTHER): Payer: BC Managed Care – PPO

## 2020-07-19 ENCOUNTER — Ambulatory Visit (HOSPITAL_COMMUNITY)
Admission: EM | Admit: 2020-07-19 | Discharge: 2020-07-19 | Disposition: A | Discharge status: Home / Self Care | Payer: BC Managed Care – PPO | Attending: Family Medicine | Admitting: Family Medicine

## 2020-07-19 DIAGNOSIS — M160 Bilateral primary osteoarthritis of hip: Secondary | ICD-10-CM | POA: Diagnosis not present

## 2020-07-19 DIAGNOSIS — M545 Low back pain, unspecified: Secondary | ICD-10-CM

## 2020-07-19 DIAGNOSIS — M533 Sacrococcygeal disorders, not elsewhere classified: Secondary | ICD-10-CM

## 2020-07-19 DIAGNOSIS — W19XXXA Unspecified fall, initial encounter: Secondary | ICD-10-CM

## 2020-07-19 IMAGING — DX DG SACRUM/COCCYX 2+V
3 series · 3 of 3 positions shown · non-contrast
Comparison: None.

CLINICAL DATA: Fall with pain and tenderness

EXAM:
SACRUM AND COCCYX - 2+ VIEW

[coccyx ap]
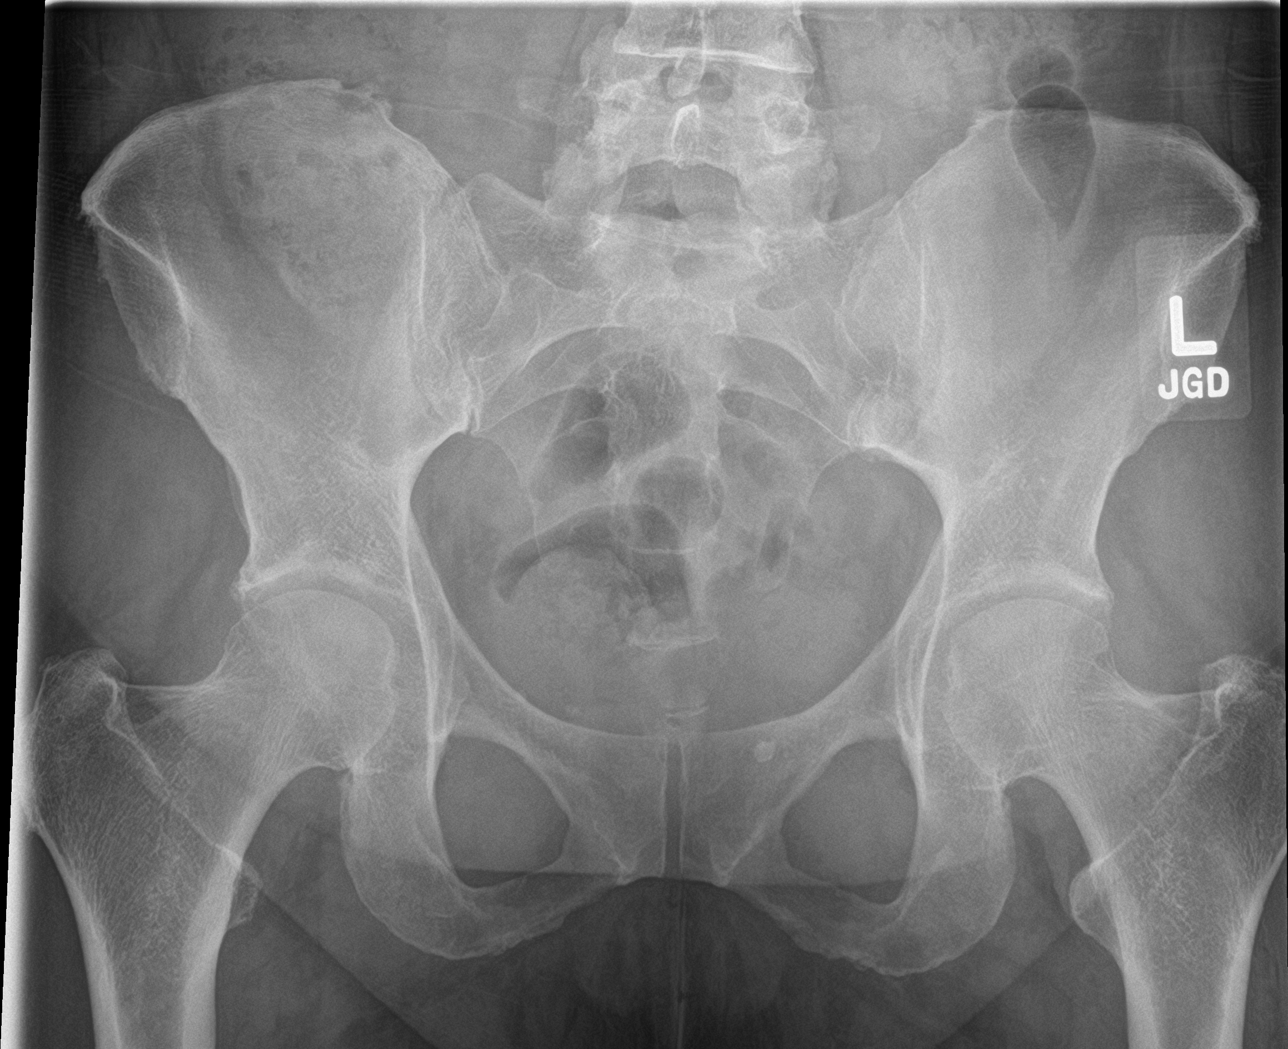

[sacrum ap]
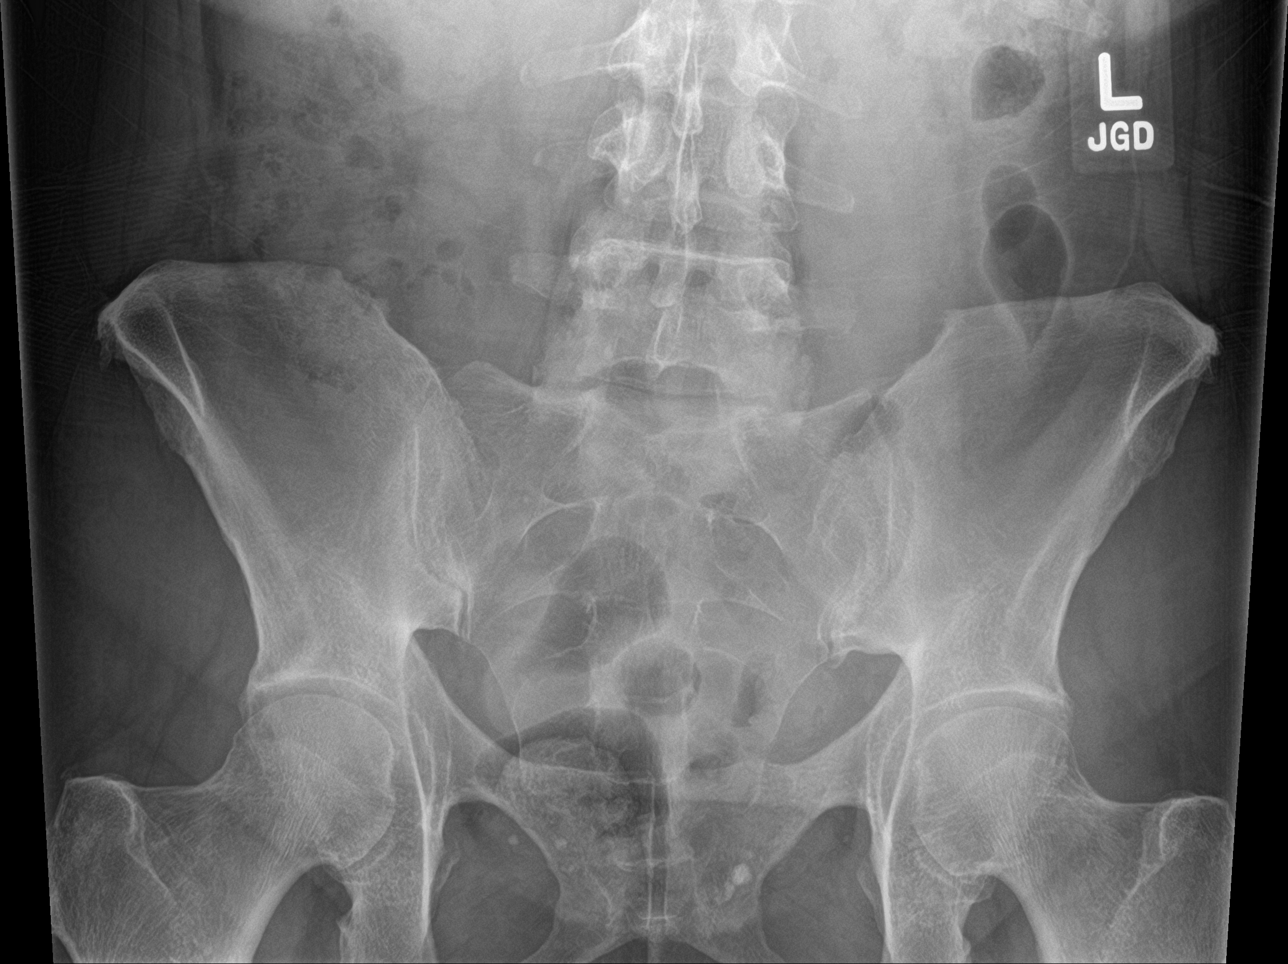

[sacrum lat]
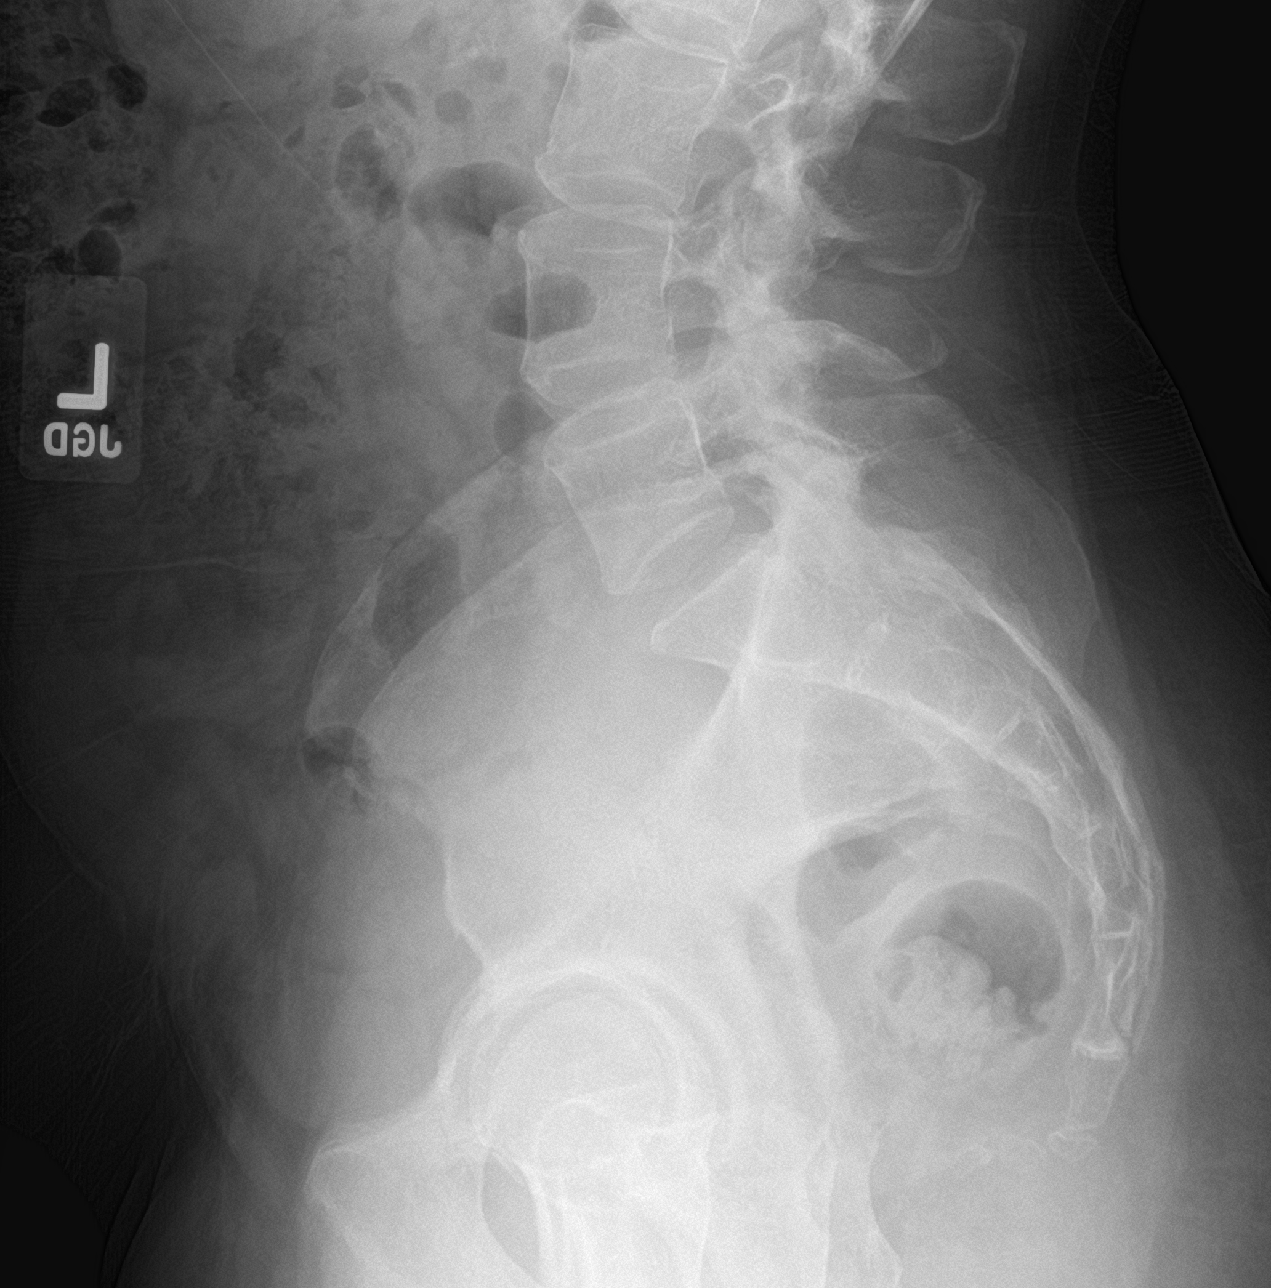

[3 of 3 positions shown; findings below may reference images not displayed]

FINDINGS: There is no evidence of fracture or dislocation. Mild degenerative
changes are seen in both hips.
IMPRESSION: No acute osseous injury.

## 2020-07-19 MED ORDER — NABUMETONE 500 MG PO TABS: 500.0000 mg | ORAL_TABLET | Freq: Every day | ORAL | 0 refills | Status: DC

## 2020-07-19 MED ORDER — ONDANSETRON HCL 4 MG PO TABS: 4.0000 mg | ORAL_TABLET | Freq: Four times a day (QID) | ORAL | 0 refills | Status: DC

## 2020-07-19 MED ORDER — HYDROCODONE-ACETAMINOPHEN 5-325 MG PO TABS: 1.0000 | ORAL_TABLET | Freq: Four times a day (QID) | ORAL | 0 refills | Status: DC | PRN

## 2020-07-19 MED ORDER — CYCLOBENZAPRINE HCL 10 MG PO TABS: 10.0000 mg | ORAL_TABLET | Freq: Two times a day (BID) | ORAL | 0 refills | Status: DC | PRN

## 2020-07-19 NOTE — ED Triage Notes (Signed)
Pt reports slipping in the kitchen yesterday and sat down with injury to tail bone. Pt reports nausea with pain . Pt ambulatory to room pain is making legs feel weak.

## 2020-07-19 NOTE — ED Provider Notes (Signed)
[facility]   [id number] 07/19/20 Arrival Time: 1309  [id number] PAIN  SUBJECTIVE: History from: patient. Sara West is a 54 y.o. female complains of tailbone and left elbow pain that began last night. Reports that she slipped and fell in her kitchen. Cannot put weight on her tailbone. Pain is constant and sharp in character. Has tried OTC medications without relief.  Symptoms are made worse with activity. Denies similar symptoms in the past. Denies fever, chills, erythema, ecchymosis, effusion, weakness, numbness and tingling, saddle paresthesias, loss of bowel or bladder function.      ROS: As per HPI.  All other pertinent ROS negative.     Past Medical History:  Diagnosis Date  . Abnormal liver function test 02/06/2017  . Abnormal thyroid blood test 02/06/2017  . Anxiety 05/22/2007  . Bipolar disorder 05/22/2007   per patient no  . Depression   . Fatigue 10/28/2016  . Leg pain, bilateral 03/05/2013  . Plantar fasciitis, bilateral 03/05/2013  . Psoriasis 04/24/2007  . Weight gain 01/19/2014   Past Surgical History:  Procedure Laterality Date  . CESAREAN SECTION     x2 requiring blood transfusion (BTL with 2nd one)  . CHOLECYSTECTOMY    . heart ablation     times 2  . TONSILECTOMY, ADENOIDECTOMY, BILATERAL MYRINGOTOMY AND TUBES     No Known Allergies No current facility-administered medications on file prior to encounter.   Current Outpatient Medications on File Prior to Encounter  Medication Sig Dispense Refill  . busPIRone (BUSPAR) 5 MG tablet Take 5 mg by mouth 2 (two) times daily.    Marland Kitchen estradiol (ESTRACE) 0.1 MG/GM vaginal cream 1 gram vaginally twice weekly 42.5 g 6  . hydrOXYzine (VISTARIL) 25 MG capsule Take 25 mg by mouth every 6 (six) hours as needed.    Marland Kitchen omeprazole (PRILOSEC) 40 MG capsule Take 40 mg by mouth as needed (acid reflux).     . traZODone (DESYREL) 50 MG tablet Take 50 mg by mouth at bedtime as needed. for sleep    . citalopram (CELEXA) 20 MG  tablet TAKE 1 AND 1/2 TABLETS BY MOUTH DAILY. (Patient taking differently: Take 10 mg by mouth daily. ) 135 tablet 1   Social History   Socioeconomic History  . Marital status: Married    Spouse name: Not on file  . Number of children: Not on file  . Years of education: Not on file  . Highest education level: Not on file  Occupational History  . Occupation: free lance for [employer]  Tobacco Use  . Smoking status: Former Smoker    Types: Cigarettes    Quit date: 11/19/2005    Years since quitting: 14.6  . Smokeless tobacco: Never Used  Substance and Sexual Activity  . Alcohol use: Not Currently  . Drug use: No  . Sexual activity: Yes    Birth control/protection: Surgical    Comment: BTL  Other Topics Concern  . Not on file  Social History Narrative  . Not on file   Social Determinants of Health   Financial Resource Strain:   . Difficulty of Paying Living Expenses: Not on file  Food Insecurity:   . Worried About Programme researcher, broadcasting/film/video in the Last Year: Not on file  . Ran Out of Food in the Last Year: Not on file  Transportation Needs:   . Lack of Transportation (Medical): Not on file  . Lack of Transportation (Non-Medical): Not on file  Physical Activity:   .  Days of Exercise per Week: Not on file  . Minutes of Exercise per Session: Not on file  Stress:   . Feeling of Stress : Not on file  Social Connections:   . Frequency of Communication with Friends and Family: Not on file  . Frequency of Social Gatherings with Friends and Family: Not on file  . Attends Religious Services: Not on file  . Active Member of Clubs or Organizations: Not on file  . Attends Banker Meetings: Not on file  . Marital Status: Not on file  Intimate Partner Violence:   . Fear of Current or Ex-Partner: Not on file  . Emotionally Abused: Not on file  . Physically Abused: Not on file  . Sexually Abused: Not on file   Family History  Problem Relation Age of Onset  .  Alcohol abuse Mother   . Cancer Mother        lung, smoker  . Arthritis Sister        walker, uses for balance issues  . Hypertension Sister   . Anorexia nervosa Daughter   . Breast cancer Paternal Aunt     OBJECTIVE:  Vitals:   07/19/20 1407 07/19/20 1411  BP:  (!) 145/69  Pulse:  65  Resp:  (!) 8  Temp:  98.1 F (36.7 C)  TempSrc:  Oral  SpO2:  99%  Weight: 160 lb (72.6 kg)   Height: 5\' 4"  (1.626 m)     General appearance: ALERT; in no acute distress.  Head: NCAT Lungs: Normal respiratory effort CV: pulses 2+ bilaterally. Cap refill < 2 seconds Musculoskeletal:  Inspection: Skin warm, dry, clear and intact, not able to sit normally Palpation: Coccyx tender to palpation ROM: limited ROM active and passive Skin: warm and dry Neurologic: Ambulates without difficulty; Sensation intact about the upper/ lower extremities Psychological: alert and cooperative; normal mood and affect  DIAGNOSTIC STUDIES:  DG Sacrum/Coccyx  Result Date: 07/19/2020 CLINICAL DATA:  Fall with pain and tenderness EXAM: SACRUM AND COCCYX - 2+ VIEW COMPARISON:  None. FINDINGS: There is no evidence of fracture or dislocation. Mild degenerative changes are seen in both hips. IMPRESSION: No acute osseous injury. Electronically Signed   By: 07/21/2020 [name]   On: 07/19/2020 15:05     ASSESSMENT & PLAN:  1. Coccyx pain   2. Acute midline low back pain without sciatica      Meds ordered this encounter  Medications  . HYDROcodone-acetaminophen (NORCO/VICODIN) 5-325 MG tablet    Sig: Take 1 tablet by mouth every 6 (six) hours as needed for up to 3 days for moderate pain or severe pain.    Dispense:  10 tablet    Refill:  0    Order Specific Question:   Supervising Provider    Answer:   07/21/2020 Merrilee Jansky  . cyclobenzaprine (FLEXERIL) 10 MG tablet    Sig: Take 1 tablet (10 mg total) by mouth 2 (two) times daily as needed for muscle spasms.    Dispense:  20 tablet    Refill:  0     Order Specific Question:   Supervising Provider    Answer:   [id number] Merrilee Jansky  . nabumetone (RELAFEN) 500 MG tablet    Sig: Take 1 tablet (500 mg total) by mouth daily.    Dispense:  30 tablet    Refill:  0    Order Specific Question:   Supervising Provider    Answer:   [id number] [  [id number]]  . ondansetron (ZOFRAN) 4 MG tablet    Sig: Take 1 tablet (4 mg total) by mouth every 6 (six) hours.    Dispense:  12 tablet    Refill:  0    Order Specific Question:   Supervising Provider    Answer:   Merrilee Jansky [[id number]]   Prescribed Zofran Prescribed relafen Prescribed Norco Continue conservative management of rest, ice, and gentle stretches Take ibuprofen as needed for pain relief (may cause abdominal discomfort, ulcers, and GI bleeds avoid taking with other NSAIDs) Take cyclobenzaprine at nighttime for symptomatic relief. Avoid driving or operating heavy machinery while using medication. Follow up with PCP if symptoms persist Return or go to the ER if you have any new or worsening symptoms (fever, chills, chest pain, abdominal pain, changes in bowel or bladder habits, pain radiating into lower legs)   [place] Controlled Substances Registry consulted for this patient. I feel the risk/benefit ratio today is favorable for proceeding with this prescription for a controlled substance. Medication sedation precautions given.  Reviewed expectations re: course of current medical issues. Questions answered. Outlined signs and symptoms indicating need for more acute intervention. Patient verbalized understanding. After Visit Summary given.       Moshe Cipro, NP 07/19/20 1656

## 2020-07-19 NOTE — Discharge Instructions (Signed)
You have fractured your tailbone.   I have sent in Norco for you to take for pain one tablet every 6 hours as needed for pain.  I have sent in Ibuprofen 800mg  for you to take one tablet every 8 hours as needed for inflammation and pain  I have sent in flexeril for you to take twice a day as needed for muscle spasms  Get a donut pillow to offset the pressure when you are sitting or lying.  Follow up with orthopedics as needed  Follow up with this office or with primary care if symptoms are persisting.  Follow up in the ER for high fever, trouble swallowing, trouble breathing, other concerning symptoms.

## 2020-07-22 ENCOUNTER — Other Ambulatory Visit: Payer: Self-pay

## 2020-07-22 ENCOUNTER — Encounter: Payer: Self-pay | Admitting: Family Medicine

## 2020-07-22 ENCOUNTER — Ambulatory Visit (INDEPENDENT_AMBULATORY_CARE_PROVIDER_SITE_OTHER): Payer: BC Managed Care – PPO | Admitting: Family Medicine

## 2020-07-22 VITALS — BP 104/72 | HR 69 | Ht 64.0 in | Wt 164.8 lb

## 2020-07-22 DIAGNOSIS — M25522 Pain in left elbow: Secondary | ICD-10-CM

## 2020-07-22 DIAGNOSIS — M533 Sacrococcygeal disorders, not elsewhere classified: Secondary | ICD-10-CM | POA: Diagnosis not present

## 2020-07-22 MED ORDER — HYDROCODONE-ACETAMINOPHEN 5-325 MG PO TABS: 1.0000 | ORAL_TABLET | Freq: Four times a day (QID) | ORAL | 0 refills | Status: DC | PRN

## 2020-07-22 NOTE — Progress Notes (Signed)
    Subjective:    CC: Sacrum/coccyx pain  I, Sara West, LAT, ATC, am serving as scribe for Dr. Clementeen Graham.  HPI: Sara West is a 54 y/o female presenting w/ c/o sacral/coccyx after slipping and falling in her kitchen, landing on her tailbone on 07/18/20.  She was seen at the the [facility] on 07/19/20 and was prescribed hyrdrocodone-acetaminophen, Flexeril and Relafen.  Since then, Sara West reports that her pain is better but still present, particularly w/ sitting/pressure to the area.  She locates her pain to midline sacral pain.  Radiating pain: yes into her buttocks Aggravating factors: sitting; pressure to the area; active L hip flexion; lumbar flexion bowel movements Treatments tried: hydrocodocne-acetaminophen, Flexeril; Relafen; ice; heat  Diagnostic imaging: sacrum/coccyx XR- 07/19/20  Pertinent review of Systems: No fevers or chills  Relevant historical information: History of bipolar disorder and history of suicide attempts with Xanax overdose earlier this year.   Objective:    Vitals:   07/22/20 0929  BP: 104/72  Pulse: 69  SpO2: 99%   General: Well Developed, well nourished, and in no acute distress.   MSK: L-spine nontender.  Sacrum tender palpation inferior sacrum/coccyx. Decreased lumbar and hip motion.  Antalgic gait. . Left elbow slight bruising present no bursitis.  Not particular tender normal elbow motion.  Lab and Radiology Results No results found for this or any previous visit (from the past 72 hour(s)). DG Sacrum/Coccyx  Result Date: 07/19/2020 CLINICAL DATA:  Fall with pain and tenderness EXAM: SACRUM AND COCCYX - 2+ VIEW COMPARISON:  None. FINDINGS: There is no evidence of fracture or dislocation. Mild degenerative changes are seen in both hips. IMPRESSION: No acute osseous injury. Electronically Signed   By: Romona Curls [name]   On: 07/19/2020 15:05   I, Clementeen Graham, personally (independently) visualized and performed the interpretation of  the images attached in this note.    Impression and Recommendations:    Assessment and Plan: 54 y.o. female with coccydynia after fall.  Pain pretty significant but improving.  Discussed offloading and will proceed to physical therapy starting in a week or 2.  Talked about transition away from opiates for pain control.  Will prescribe small amount of hydrocodone but anticipate switching off of opiates in the next few days.  Recommend heat as well.  Left elbow pain contusion.  No evidence of bursitis at this point.  Recommend continued padding and protection to avoid developing bursitis.   Additionally discussed constipation risk.  Patient has coccydynia which often cause constipation is on opiates which can cause constipation.  Discussed laxative management strategy.  Recheck 2 weeks  PDMP reviewed during this encounter. Orders Placed This Encounter  Procedures  . Ambulatory referral to Physical Therapy    Referral Priority:   Routine    Referral Type:   Physical Medicine    Referral Reason:   Specialty Services Required    Requested Specialty:   Physical Therapy   Meds ordered this encounter  Medications  . HYDROcodone-acetaminophen (NORCO/VICODIN) 5-325 MG tablet    Sig: Take 1 tablet by mouth every 6 (six) hours as needed.    Dispense:  10 tablet    Refill:  0    Discussed warning signs or symptoms. Please see discharge instructions. Patient expresses understanding.   The above documentation has been reviewed and is accurate and complete Clementeen Graham, [name]

## 2020-07-22 NOTE — Patient Instructions (Addendum)
Thank you for coming in today.  Continue ralafen up to 2x daily and tylenol for pain as needed.  Wean off hydrocodone.   Take laxatives.  Miralax as baseline with exalax (sennakt) or Bisacodyl pills or suppositories.  Up to enema as needed.   Plan for PT starting in about 1-2 weeks.   Recheck with me in 2 weeks. Let me know sooner if you are not doing well.   The elbow: Apply voltaren gel up to 4x daily.  Keep it protected from pressure if able. This will prevent swelling.   Use a cushion with a hole in it.

## 2020-08-04 NOTE — Progress Notes (Signed)
   I, Sara West, LAT, ATC, am serving as scribe for Dr. Clementeen Graham.  Sara West is a 54 y.o. female who presents to [facility] today for f/u sacral/coccyx pn after slipping and falling in her kitchen, landing on her tailbone on 07/18/20. Pt was last seen by Dr. Denyse West on 07/22/20 and advised to PT in a week or 2, of which she's completed 0 visits.  Pt was advised to transition away from opiates for pain control and was prescribed a small amount of hydrocodone. Recommend heat as well.   Pt also c/o L elbow pain contusion.  No evidence of bursitis  Pt was advised to continue padding and protection.  Today, pt reports that she is feeling much better.  She has not been to PT and states that they have not called her to schedule.  She states that her L elbow is fine but con't to have some pain in her sacrum and low back.  She locates the majority of her pain in her low back at this point and less in the sacrum. She has been using some topical pain relievers.  She rates her improvement at 80%.    Dx imaging:  sacrum/coccyx XR - 07/19/20  Pertinent review of systems: No fevers or chills  Relevant historical information: Bipolar disorder.  History drug overdose suicide attempt with benzodiazepine earlier this year.   Exam:  BP 110/74 (BP Location: Right Arm, Patient Position: Sitting, Cuff Size: Normal)   Pulse 78   Ht 5\' 4"  (1.626 m)   Wt 167 lb 3.2 oz (75.8 kg)   SpO2 97%   BMI 28.70 kg/m  General: Well Developed, well nourished, and in no acute distress.   MSK: L-spine normal-appearing Nontender midline. Tender palpation lumbar paraspinal musculature.  Spinal musculature rigid. Decreased lumbar motion. Lakshmi strength is intact.    Lab and Radiology Results Recent images sacrum and coccyx obtained October 31 personally independently interpreted again today. Portion of lumbar spine visible on these images do not show significant abnormality  including no fracture visible.  Full lumbar spine not visible on these x-rays.    Assessment and Plan: 54 y.o. female with coccydynia after fall improving about 85% better now.  However patient has new/worsening low back pain due to lumbosacral spasm and dysfunction.  This should be well treated with physical therapy.  Recommend continued cyclobenzaprine heating pad and adding TENS unit.  Will reorder physical therapy.  Provided patient the phone number to call to schedule.  If she has a problem scheduling physical therapy she will let 57 know.    Check back in 1 month.   PDMP not reviewed this encounter. Orders Placed This Encounter  Procedures  . Ambulatory referral to Physical Therapy    Referral Priority:   Routine    Referral Type:   Physical Medicine    Referral Reason:   Specialty Services Required    Requested Specialty:   Physical Therapy   Meds ordered this encounter  Medications  . cyclobenzaprine (FLEXERIL) 10 MG tablet    Sig: Take 1 tablet (10 mg total) by mouth 2 (two) times daily as needed for muscle spasms.    Dispense:  30 tablet    Refill:  1     Discussed warning signs or symptoms. Please see discharge instructions. Patient expresses understanding.   The above documentation has been reviewed and is accurate and complete US, [name]

## 2020-08-05 ENCOUNTER — Encounter: Payer: Self-pay | Admitting: Family Medicine

## 2020-08-05 ENCOUNTER — Other Ambulatory Visit: Payer: Self-pay

## 2020-08-05 ENCOUNTER — Ambulatory Visit (INDEPENDENT_AMBULATORY_CARE_PROVIDER_SITE_OTHER): Payer: BC Managed Care – PPO | Admitting: Family Medicine

## 2020-08-05 VITALS — BP 110/74 | HR 78 | Ht 64.0 in | Wt 167.2 lb

## 2020-08-05 DIAGNOSIS — S39012A Strain of muscle, fascia and tendon of lower back, initial encounter: Secondary | ICD-10-CM | POA: Diagnosis not present

## 2020-08-05 DIAGNOSIS — M533 Sacrococcygeal disorders, not elsewhere classified: Secondary | ICD-10-CM

## 2020-08-05 MED ORDER — CYCLOBENZAPRINE HCL 10 MG PO TABS: 10.0000 mg | ORAL_TABLET | Freq: Two times a day (BID) | ORAL | 1 refills | Status: DC | PRN

## 2020-08-05 NOTE — Patient Instructions (Addendum)
Thank you for coming in today.  Plan for PT.  Call the office and schedule.  Let me know if you have trouble scheduling.   Use heat on the back and TENS unit.   Recheck in 1 month.   Ok to use muscle relaxer.   TENS UNIT: This is helpful for muscle pain and spasm.   Search and Purchase a TENS 7000 2nd edition at  www.tenspros.com or www.Amazon.com It should be less than $30.     TENS unit instructions: Do not shower or bathe with the unit on . Turn the unit off before removing electrodes or batteries . If the electrodes lose stickiness add a drop of water to the electrodes after they are disconnected from the unit and place on plastic sheet. If you continued to have difficulty, call the TENS unit company to purchase more electrodes. . Do not apply lotion on the skin area prior to use. Make sure the skin is clean and dry as this will help prolong the life of the electrodes. . After use, always check skin for unusual red areas, rash or other skin difficulties. If there are any skin problems, does not apply electrodes to the same area. . Never remove the electrodes from the unit by pulling the wires. . Do not use the TENS unit or electrodes other than as directed. . Do not change electrode placement without consultating your therapist or physician. Marland Kitchen Keep 2 fingers with between each electrode. . Wear time ratio is 2:1, on to off times.    For example on for 30 minutes off for 15 minutes and then on for 30 minutes off for 15 minutes     Lumbosacral Strain Lumbosacral strain is an injury that causes pain in the lower back (lumbosacral spine). This injury usually happens from overstretching the muscles or ligaments along your spine. Ligaments are cord-like tissues that connect bones to other bones. A strain can affect one or more muscles or ligaments. What are the causes? This condition may be caused by: A hard, direct hit to the back. Overstretching the lower back muscles. This  may result from: A fall. Lifting something heavy. Repetitive movements such as bending or crouching. What increases the risk? The following factors may make you more likely to develop this condition: Participating in sports or activities that involve: A sudden twist of the back. Pushing or pulling motions. Being overweight or obese. Having poor strength and flexibility, especially tight hamstrings or weak muscles in the back or abdomen. Having too much of a curve in the lower back. Having a pelvis that is tilted forward. What are the signs or symptoms? The main symptom of this condition is pain in the lower back, at the site of the strain. Pain may also be felt down one or both legs. How is this diagnosed? This condition is diagnosed based on your symptoms, your medical history, and a physical exam. During the physical exam, your health care provider may push on certain areas of your back to find the source of your pain. You may be asked to bend forward, backward, and side to side to check your pain and range of motion. You may also have imaging tests, such as X-rays and an MRI. How is this treated? This condition may be treated by: Applying heat and cold on the affected area. Taking medicines to help relieve pain and relax your muscles. Taking NSAIDs, such as ibuprofen, to help reduce swelling and discomfort. Doing stretching and strengthening exercises for  your lower back. Symptoms usually improve within several weeks of treatment. However, recovery time varies. When your symptoms improve, gradually return to your normal routine as soon as possible to reduce pain, avoid stiffness, and keep muscle strength. Follow these instructions at home: Medicines Take over-the-counter and prescription medicines only as told by your health care provider. Ask your health care provider if the medicine prescribed to you: Requires you to avoid driving or using heavy machinery. Can cause constipation.  You may need to take these actions to prevent or treat constipation: Drink enough fluid to keep your urine pale yellow. Take over-the-counter or prescription medicines. Eat foods that are high in fiber, such as beans, whole grains, and fresh fruits and vegetables. Limit foods that are high in fat and processed sugars, such as fried or sweet foods. Managing pain, stiffness, and swelling     If directed, put ice on the injured area. To do this: Put ice in a plastic bag. Place a towel between your skin and the bag. Leave the ice on for 20 minutes, 2-3 times a day. If directed, apply heat on the affected area as often as told by your health care provider. Use the heat source that your health care provider recommends, such as a moist heat pack or a heating pad. Place a towel between your skin and the heat source. Leave the heat on for 20-30 minutes. Remove the heat if your skin turns bright red. This is especially important if you are unable to feel pain, heat, or cold. You may have a greater risk of getting burned. Activity Rest as told by your health care provider. Do not stay in bed. Staying in bed for more than 1-2 days can delay your recovery. Return to your normal activities as told by your health care provider. Ask your health care provider what activities are safe for you. Avoid activities that take a lot of energy for as long as told by your health care provider. Do exercises as told by your health care provider. This includes stretching and strengthening exercises. General instructions Sit up and stand up straight. Avoid leaning forward when you sit, or hunching over when you stand. Do not use any products that contain nicotine or tobacco, such as cigarettes, e-cigarettes, and chewing tobacco. If you need help quitting, ask your health care provider. Keep all follow-up visits as told by your health care provider. This is important. How is this prevented?  Use correct form when  playing sports and lifting heavy objects. Use good posture when sitting and standing. Maintain a healthy weight. Sleep on a mattress with medium firmness to support your back. Do at least 150 minutes of moderate-intensity exercise each week, such as brisk walking or water aerobics. Try a form of exercise that takes stress off your back, such as swimming or stationary cycling. Maintain physical fitness, including: Strength. Flexibility. Contact a health care provider if: Your back pain does not improve after several weeks of treatment. Your symptoms get worse. Get help right away if: Your back pain is severe. You cannot stand or walk. You have difficulty controlling when you urinate or when you have a bowel movement. You feel nauseous or you vomit. Your feet or legs get very cold, turn pale, or look blue. You have numbness, tingling, weakness, or problems using your arms or legs. You develop any of the following: Shortness of breath. Dizziness. Pain in your legs. Weakness in your buttocks or legs. Summary Lumbosacral strain is an injury that  causes pain in the lower back (lumbosacral spine). This injury usually happens from overstretching the muscles or ligaments along your spine. This condition may be caused by a direct hit to the lower back or by overstretching the lower back muscles. Symptoms usually improve within several weeks of treatment. This information is not intended to replace advice given to you by your health care provider. Make sure you discuss any questions you have with your health care provider. Document Revised: 01/29/2019 Document Reviewed: 01/29/2019 Elsevier Patient Education  2020 [employer].

## 2020-08-21 ENCOUNTER — Encounter: Payer: Self-pay | Admitting: Family Medicine

## 2020-08-22 ENCOUNTER — Other Ambulatory Visit: Payer: Self-pay | Admitting: Family Medicine

## 2020-08-22 MED ORDER — AMPHETAMINE-DEXTROAMPHETAMINE 10 MG PO TABS
10.0000 mg | ORAL_TABLET | Freq: Every day | ORAL | 0 refills | Status: DC
Start: 2020-08-22 — End: 2020-10-07

## 2020-08-24 ENCOUNTER — Encounter: Payer: Self-pay | Admitting: Family Medicine

## 2020-08-24 DIAGNOSIS — H2513 Age-related nuclear cataract, bilateral: Secondary | ICD-10-CM | POA: Diagnosis not present

## 2020-08-24 DIAGNOSIS — H31093 Other chorioretinal scars, bilateral: Secondary | ICD-10-CM | POA: Diagnosis not present

## 2020-08-24 DIAGNOSIS — H5319 Other subjective visual disturbances: Secondary | ICD-10-CM | POA: Diagnosis not present

## 2020-08-24 DIAGNOSIS — H04123 Dry eye syndrome of bilateral lacrimal glands: Secondary | ICD-10-CM | POA: Diagnosis not present

## 2020-09-24 DIAGNOSIS — F411 Generalized anxiety disorder: Secondary | ICD-10-CM | POA: Diagnosis not present

## 2020-10-05 ENCOUNTER — Encounter: Payer: Self-pay | Admitting: Family Medicine

## 2020-10-06 NOTE — Progress Notes (Unsigned)
   I, Philbert Riser, LAT, ATC acting as a scribe for Clementeen Graham, MD.  Sara West is a 55 y.o. female who presents to [facility] today for f/u low back pain due to lumbosacral spasm and dysfunction that she injured slipping and falling in her kitchen, landing on her tailbone on 07/18/20. Pt was last seen by Dr. [name] on 08/05/20 and was referred to PT, however due to high co-pay pt has not been able to attend and was advised to continue cyclobenzaprine, heating pad, and add using a TENS unit. Today, pt reports back coccyx feels someone better, but she had pain through thoracic-lumbar bilaterally and then across superior portion of sacrum. Pt reports falling a second time at work 2-3 weeks ago, landing on R knee, back, and hip/buttock. Pt c/o back is "popping" when rotation to the R and extending back.   LE numbness/tingling: no LE weakness: yes Aggravates: being on her feet, later in the day Rx tried: massage therapy, massage ball.   Dx imaging: 07/19/20 Sacrum/coccyx XR   Pertinent review of systems: No fevers or chills  Relevant historical information: History of depression.   Exam:  BP 132/90 (BP Location: Right Arm, Patient Position: Sitting, Cuff Size: Normal)   Pulse 80   Ht 5\' 4"  (1.626 m)   Wt 167 lb 12.8 oz (76.1 kg)   SpO2 98%   BMI 28.80 kg/m  General: Well Developed, well nourished, and in no acute distress.   MSK: L-spine: Normal. Nontender midline.  Tender palpation lumbar paraspinal musculature.  Paraspinal muscles are rigid and tender to palpation. Decreased lumbar motion. Lower extremity strength reflexes and sensation are intact distally.    Lab and Radiology Results  X-ray images L-spine obtained today personally and independently interpreted Neuroforaminal stenosis and DDD at L5-S1.  No fractures visible. Await formal radiology review    Assessment and Plan: 55 y.o. female with low back pain.  Significantly worsening  low back pain after fall at work about 3 weeks ago.  Pain thought to be due to lumbosacral strain and spasm.  She is having trouble affording physical therapy so we will proceed with home exercise program as taught in clinic today by ATC. Is conservative management with muscle relaxers, NSAIDs, heating pad TENS unit.  Limited hydrocodone.  Recheck if not improving.  Consider formal physical therapy if not improving.   PDMP reviewed during this encounter. Orders Placed This Encounter  Procedures  . DG Lumbar Spine 2-3 Views    Standing Status:   Future    Standing Expiration Date:   10/07/2021    Order Specific Question:   Reason for Exam (SYMPTOM  OR DIAGNOSIS REQUIRED)    Answer:   eval low back pain    Order Specific Question:   Is patient pregnant?    Answer:   No    Order Specific Question:   Preferred imaging location?    Answer:   10/09/2021   Meds ordered this encounter  Medications  . HYDROcodone-acetaminophen (NORCO/VICODIN) 5-325 MG tablet    Sig: Take 1 tablet by mouth every 6 (six) hours as needed.    Dispense:  10 tablet    Refill:  0     Discussed warning signs or symptoms. Please see discharge instructions. Patient expresses understanding.   The above documentation has been reviewed and is accurate and complete Kyra Searles, [name]

## 2020-10-07 ENCOUNTER — Other Ambulatory Visit: Payer: Self-pay

## 2020-10-07 ENCOUNTER — Ambulatory Visit (INDEPENDENT_AMBULATORY_CARE_PROVIDER_SITE_OTHER): Payer: BC Managed Care – PPO

## 2020-10-07 ENCOUNTER — Ambulatory Visit: Payer: BC Managed Care – PPO | Admitting: Family Medicine

## 2020-10-07 ENCOUNTER — Other Ambulatory Visit: Payer: Self-pay | Admitting: Family Medicine

## 2020-10-07 VITALS — BP 132/90 | HR 80 | Ht 64.0 in | Wt 167.8 lb

## 2020-10-07 DIAGNOSIS — S39012A Strain of muscle, fascia and tendon of lower back, initial encounter: Secondary | ICD-10-CM

## 2020-10-07 DIAGNOSIS — M545 Low back pain, unspecified: Secondary | ICD-10-CM | POA: Diagnosis not present

## 2020-10-07 DIAGNOSIS — W19XXXA Unspecified fall, initial encounter: Secondary | ICD-10-CM

## 2020-10-07 IMAGING — DX DG LUMBAR SPINE 2-3V
3 series · 3 of 3 positions shown · non-contrast
Comparison: [DATE]

CLINICAL DATA: Low back pain.

EXAM:
LUMBAR SPINE - 2-3 VIEW

[l-spine ap]
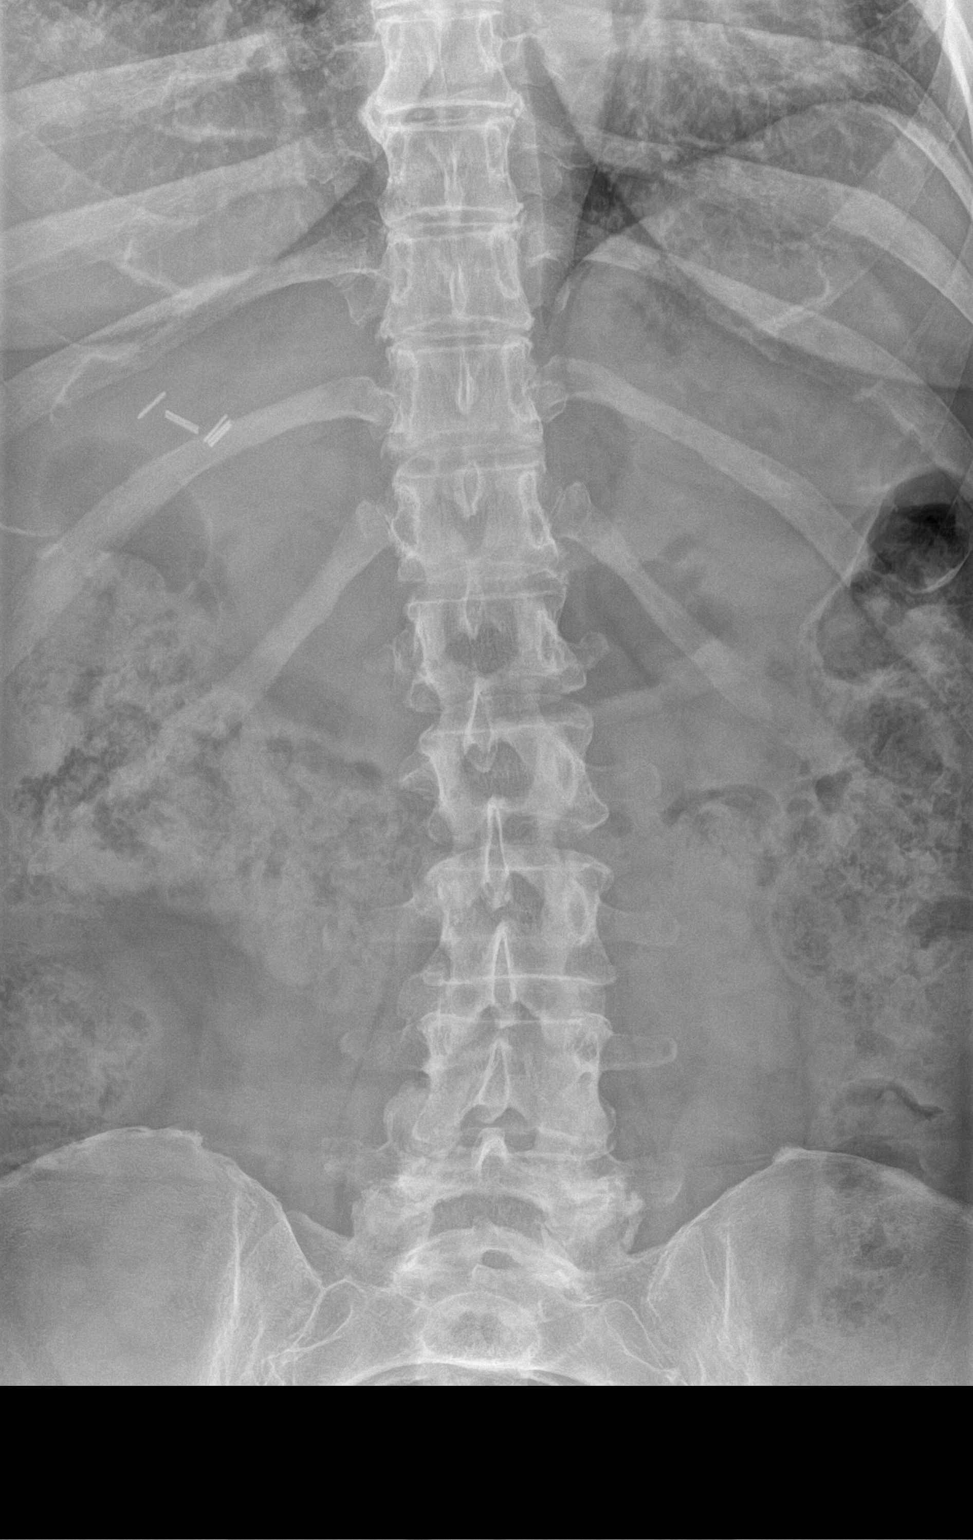

[l-spine lateral (1 of 2)]
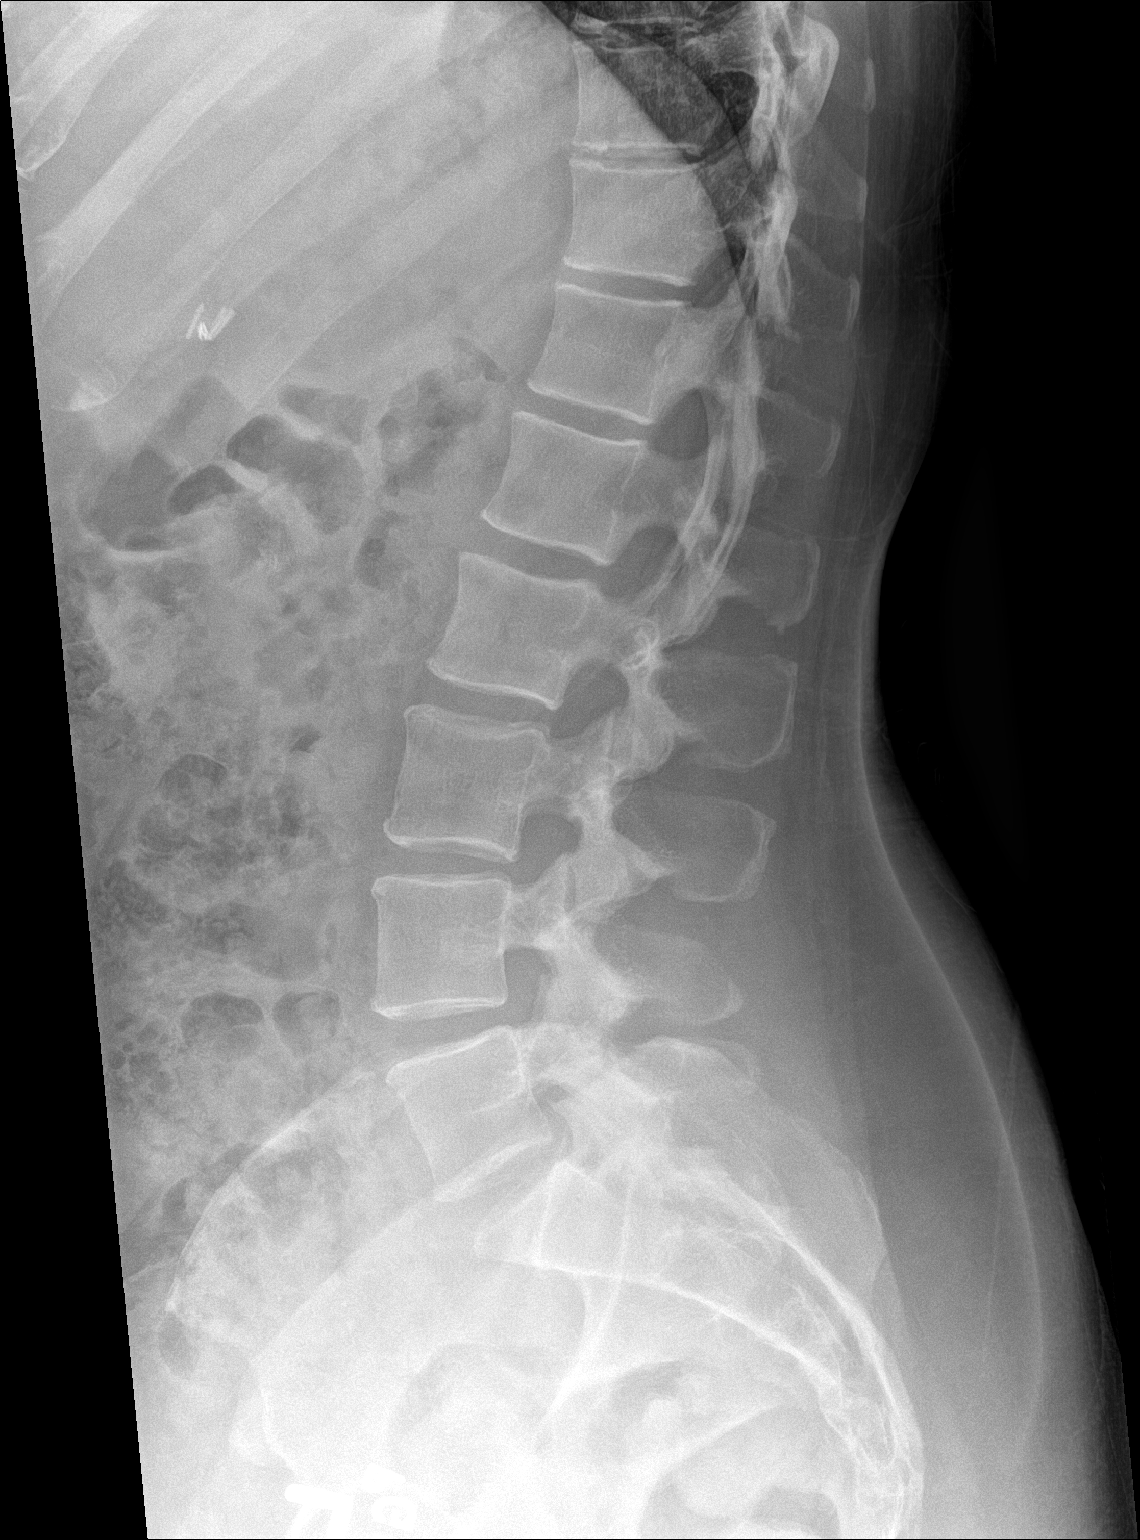

[l-spine lateral (2 of 2)]
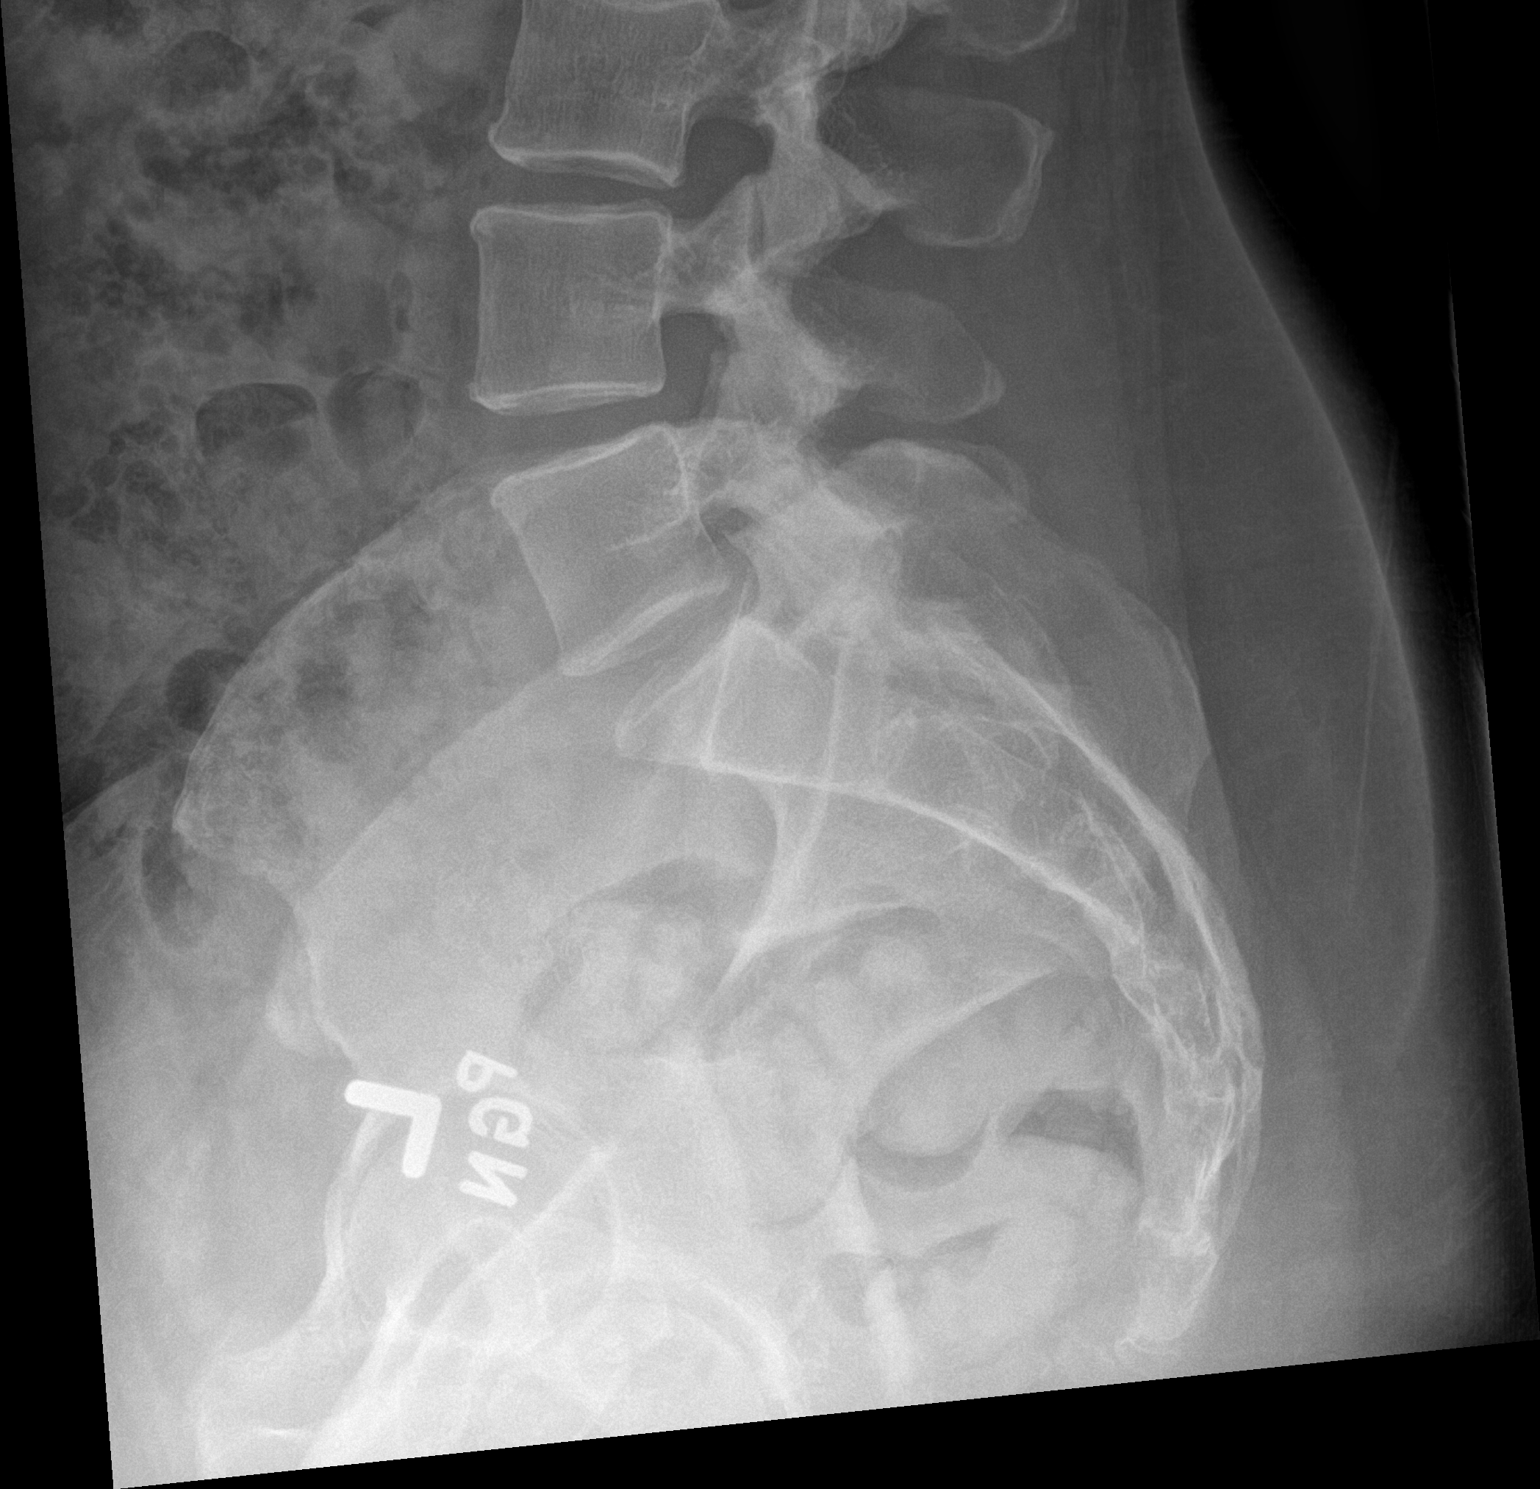

[3 of 3 positions shown; findings below may reference images not displayed]

FINDINGS: Mild disc height loss is noted throughout the lumbar spine, greatest
at the L5-S1 level. There appears to be some mild facet arthrosis at
the lower lumbar segments. There is no acute fracture. No
significant malalignment.
IMPRESSION: Mild multilevel degenerative changes of the lumbar spine.

## 2020-10-07 MED ORDER — AMPHETAMINE-DEXTROAMPHETAMINE 10 MG PO TABS
10.0000 mg | ORAL_TABLET | Freq: Every day | ORAL | 0 refills | Status: DC
Start: 2020-10-07 — End: 2020-11-26

## 2020-10-07 MED ORDER — HYDROCODONE-ACETAMINOPHEN 5-325 MG PO TABS: 1.0000 | ORAL_TABLET | Freq: Four times a day (QID) | ORAL | 0 refills | Status: DC | PRN

## 2020-10-07 NOTE — Telephone Encounter (Signed)
I am sure I have already asked her to make an appt but she needs an appt for the next month in the meantime I have sent in a refill.

## 2020-10-07 NOTE — Patient Instructions (Addendum)
Thank you for coming in today.  Please complete the exercises that the athletic trainer went over with you:  View at [email or web address] using code: [email or web address]   Use heating pad and TENS unit.   Do the home exercises.   If not better let me know and we can do PT.   Please get an Xray today before you leave   TENS UNIT: This is helpful for muscle pain and spasm.   Search and Purchase a TENS 7000 2nd edition at  www.tenspros.com or www.Amazon.com It should be less than $30.     TENS unit instructions: Do not shower or bathe with the unit on . Turn the unit off before removing electrodes or batteries . If the electrodes lose stickiness add a drop of water to the electrodes after they are disconnected from the unit and place on plastic sheet. If you continued to have difficulty, call the TENS unit company to purchase more electrodes. . Do not apply lotion on the skin area prior to use. Make sure the skin is clean and dry as this will help prolong the life of the electrodes. . After use, always check skin for unusual red areas, rash or other skin difficulties. If there are any skin problems, does not apply electrodes to the same area. . Never remove the electrodes from the unit by pulling the wires. . Do not use the TENS unit or electrodes other than as directed. . Do not change electrode placement without consultating your therapist or physician. Marland Kitchen Keep 2 fingers with between each electrode. . Wear time ratio is 2:1, on to off times.    For example on for 30 minutes off for 15 minutes and then on for 30 minutes off for 15 minutes

## 2020-10-07 NOTE — Telephone Encounter (Signed)
Requesting: Adderall 10mg  Contract: 12/03/2019 UDS: 04/28/2020  Last Visit: 12/03/2019 Next Visit: None Last Refill: 08/22/2020 #30 and 0RF  Please Advise

## 2020-10-08 ENCOUNTER — Encounter: Payer: Self-pay | Admitting: Family Medicine

## 2020-10-08 DIAGNOSIS — F411 Generalized anxiety disorder: Secondary | ICD-10-CM | POA: Diagnosis not present

## 2020-10-08 NOTE — Progress Notes (Signed)
X-ray lumbar spine shows multilevel degenerative changes.

## 2020-10-13 DIAGNOSIS — F411 Generalized anxiety disorder: Secondary | ICD-10-CM | POA: Diagnosis not present

## 2020-10-13 DIAGNOSIS — F1020 Alcohol dependence, uncomplicated: Secondary | ICD-10-CM | POA: Diagnosis not present

## 2020-10-19 ENCOUNTER — Emergency Department (HOSPITAL_COMMUNITY)
Admission: EM | Admit: 2020-10-19 | Discharge: 2020-10-20 | Disposition: A | Discharge status: Home / Self Care | Payer: BC Managed Care – PPO | Attending: Emergency Medicine | Admitting: Emergency Medicine

## 2020-10-19 ENCOUNTER — Encounter (HOSPITAL_COMMUNITY): Payer: Self-pay

## 2020-10-19 ENCOUNTER — Other Ambulatory Visit: Payer: Self-pay

## 2020-10-19 DIAGNOSIS — R41840 Attention and concentration deficit: Secondary | ICD-10-CM | POA: Insufficient documentation

## 2020-10-19 DIAGNOSIS — I10 Essential (primary) hypertension: Secondary | ICD-10-CM | POA: Insufficient documentation

## 2020-10-19 DIAGNOSIS — Z79899 Other long term (current) drug therapy: Secondary | ICD-10-CM | POA: Diagnosis not present

## 2020-10-19 DIAGNOSIS — Z87891 Personal history of nicotine dependence: Secondary | ICD-10-CM | POA: Diagnosis not present

## 2020-10-19 DIAGNOSIS — X788XXA Intentional self-harm by other sharp object, initial encounter: Secondary | ICD-10-CM | POA: Insufficient documentation

## 2020-10-19 DIAGNOSIS — Z20822 Contact with and (suspected) exposure to covid-19: Secondary | ICD-10-CM | POA: Insufficient documentation

## 2020-10-19 DIAGNOSIS — Z23 Encounter for immunization: Secondary | ICD-10-CM | POA: Insufficient documentation

## 2020-10-19 DIAGNOSIS — F29 Unspecified psychosis not due to a substance or known physiological condition: Secondary | ICD-10-CM | POA: Diagnosis not present

## 2020-10-19 DIAGNOSIS — F332 Major depressive disorder, recurrent severe without psychotic features: Secondary | ICD-10-CM | POA: Insufficient documentation

## 2020-10-19 DIAGNOSIS — T1491XA Suicide attempt, initial encounter: Secondary | ICD-10-CM | POA: Diagnosis not present

## 2020-10-19 DIAGNOSIS — R45851 Suicidal ideations: Secondary | ICD-10-CM | POA: Insufficient documentation

## 2020-10-19 DIAGNOSIS — R0689 Other abnormalities of breathing: Secondary | ICD-10-CM | POA: Diagnosis not present

## 2020-10-19 DIAGNOSIS — S41112A Laceration without foreign body of left upper arm, initial encounter: Secondary | ICD-10-CM | POA: Insufficient documentation

## 2020-10-19 DIAGNOSIS — S51812A Laceration without foreign body of left forearm, initial encounter: Secondary | ICD-10-CM | POA: Diagnosis not present

## 2020-10-19 LAB — SALICYLATE LEVEL: Salicylate Lvl: <7 mg/dL — ABNORMAL LOW (ref 7.0–30.0)

## 2020-10-19 LAB — COMPREHENSIVE METABOLIC PANEL
ALT: 49 U/L — ABNORMAL HIGH (ref 0–44)
AST: 35 U/L (ref 15–41)
Albumin: 5.2 g/dL — ABNORMAL HIGH (ref 3.5–5.0)
Alkaline Phosphatase: 63 U/L (ref 38–126)
Anion gap: 15 (ref 5–15)
BUN: 11 mg/dL (ref 6–20)
CO2: 21 mmol/L — ABNORMAL LOW (ref 22–32)
Calcium: 9.7 mg/dL (ref 8.9–10.3)
Chloride: 107 mmol/L (ref 98–111)
Creatinine, Ser: 0.77 mg/dL (ref 0.44–1.00)
GFR, Estimated: >60 mL/min (ref >60)
Glucose, Bld: 97 mg/dL (ref 70–99)
Potassium: 4 mmol/L (ref 3.5–5.1)
Sodium: 143 mmol/L (ref 135–145)
Total Bilirubin: 0.4 mg/dL (ref 0.3–1.2)
Total Protein: 9 g/dL — ABNORMAL HIGH (ref 6.5–8.1)

## 2020-10-19 LAB — ACETAMINOPHEN LEVEL: Acetaminophen (Tylenol), Serum: <10 ug/mL — ABNORMAL LOW (ref 10–30)

## 2020-10-19 LAB — SARS CORONAVIRUS 2 BY RT PCR (HOSPITAL ORDER, PERFORMED IN ~~LOC~~ HOSPITAL LAB): SARS Coronavirus 2: NEGATIVE

## 2020-10-19 LAB — ETHANOL: Alcohol, Ethyl (B): 127 mg/dL — ABNORMAL HIGH (ref <10)

## 2020-10-19 LAB — CBC
HCT: 45.5 % (ref 36.0–46.0)
Hemoglobin: 14.8 g/dL (ref 12.0–15.0)
MCH: 30 pg (ref 26.0–34.0)
MCHC: 32.5 g/dL (ref 30.0–36.0)
MCV: 92.1 fL (ref 80.0–100.0)
Platelets: 295 10*3/uL (ref 150–400)
RBC: 4.94 MIL/uL (ref 3.87–5.11)
RDW: 12.7 % (ref 11.5–15.5)
WBC: 8.3 10*3/uL (ref 4.0–10.5)
nRBC: 0 % (ref 0.0–0.2)

## 2020-10-19 LAB — RAPID URINE DRUG SCREEN, HOSP PERFORMED
Amphetamines: NOT DETECTED
Barbiturates: NOT DETECTED
Benzodiazepines: NOT DETECTED
Cocaine: NOT DETECTED
Opiates: NOT DETECTED
Tetrahydrocannabinol: POSITIVE — AB

## 2020-10-19 LAB — I-STAT BETA HCG BLOOD, ED (MC, WL, AP ONLY): I-stat hCG, quantitative: <5 m[IU]/mL (ref <5)

## 2020-10-19 MED ORDER — LORAZEPAM 1 MG PO TABS
1.0000 mg | ORAL_TABLET | Freq: Once | ORAL | Status: AC
  Administered 2020-10-19: 1 mg via ORAL
  Filled 2020-10-19: qty 1

## 2020-10-19 MED ORDER — TETANUS-DIPHTH-ACELL PERTUSSIS 5-2.5-18.5 LF-MCG/0.5 IM SUSY
0.5000 mL | PREFILLED_SYRINGE | Freq: Once | INTRAMUSCULAR | Status: AC
  Administered 2020-10-19: 0.5 mL via INTRAMUSCULAR
  Filled 2020-10-19: qty 0.5

## 2020-10-19 MED ORDER — LIDOCAINE-EPINEPHRINE (PF) 2 %-1:200000 IJ SOLN
10.0000 mL | Freq: Once | INTRAMUSCULAR | Status: AC
  Administered 2020-10-19: 10 mL via INTRADERMAL
  Filled 2020-10-19: qty 20

## 2020-10-19 MED ORDER — IBUPROFEN 200 MG PO TABS
400.0000 mg | ORAL_TABLET | Freq: Once | ORAL | Status: AC
  Administered 2020-10-20: 400 mg via ORAL
  Filled 2020-10-19: qty 2

## 2020-10-19 NOTE — ED Triage Notes (Signed)
Per EMS- patient is from home. Patient's husband came into the home and saw the wife frantically cleaning the house and yelling at the kids. Patient's husband confronted the patient and she picked up a razor blade and cut her left forearm. There is a superficial 2 inch cut on her left forearm  Patient denied SI to EMS,but stated, the cut was "a cry for help."

## 2020-10-19 NOTE — ED Provider Notes (Signed)
[facility]-EMERGENCY DEPT Provider Note   CSN: [id number] Arrival date & time: 10/19/20  1658     History Chief Complaint  Patient presents with  . Psychiatric Evaluation  . Suicidal    Sara West is a 55 y.o. female past medical history of bipolar, depression who presents for evaluation of suicidal ideation, wanting to hurt self.  Patient states that she "has been in a bad way for a long time" and "it all came to ahead today.  She has been dealing with a lot of stressors, including her son social anxiety and homeschooling him, her daughter's eating disorder in recovery as well as marriage problems with her husband.  She states that she feels like "it is all on her and she is losing control and she cannot take it anymore."  She states a few weeks ago, she started cutting herself.  She states that she did it because "she was in so much pain and she needed a release from it."  She states she has not done this since she was very young.  Today, she cut herself with a razor to her left arm causing a small laceration.  She also started having some suicidal ideations and states that she thought about overdosing.  She denies any homicidal ideation.  She reports that when she gets very stressed, she will drink alcohol.  She has difficulty quantifying how much she will drink but states it is situation dependent.  She also occasionally smokes marijuana.  Denies any other illicit drug use.  She states that she has not spoken to her husband in 2 weeks.  They had lunch yesterday and she felt like things were turning a corner.  Then today, they had a discussion she states he lied to her and she lost it.  She feels like he is making her "question her reality." she states that she feels "all the pressure from everything is too much for her."  She does not currently see a therapist.  The history is provided by the patient.       Past Medical History:  Diagnosis Date  . Abnormal liver  function test 02/06/2017  . Abnormal thyroid blood test 02/06/2017  . Anxiety 05/22/2007  . Bipolar disorder 05/22/2007   per patient no  . Depression   . Fatigue 10/28/2016  . Leg pain, bilateral 03/05/2013  . Plantar fasciitis, bilateral 03/05/2013  . Psoriasis 04/24/2007  . Weight gain 01/19/2014    Patient Active Problem List   Diagnosis Date Noted  . Severe episode of recurrent major depressive disorder, without psychotic features (HCC)   . Atrophic vaginitis 12/04/2019  . Attention or concentration deficit 12/03/2019  . Laryngopharyngeal reflux (LPR) 11/18/2019  . Renal insufficiency 04/04/2018  . Right hip pain 04/04/2018  . Nausea 07/31/2017  . Dizziness 07/27/2017  . High blood pressure 07/27/2017  . Atypical chest pain 07/27/2017  . Hyperglycemia 07/27/2017  . Abnormal thyroid blood test 02/06/2017  . Abnormal liver function test 02/06/2017  . Fatigue 10/28/2016  . Severe headache 04/12/2015  . Weight gain 01/19/2014  . Plantar fasciitis, bilateral 03/05/2013  . Leg pain, bilateral 03/05/2013  . Routine general medical examination at a health care facility 08/03/2011  . Palpitations 01/09/2011  . Insomnia 12/10/2009  . Calculus of gallbladder 05/22/2007  . Bipolar disorder, unspecified (HCC) 05/22/2007  . Anxiety and depression 05/22/2007  . Psoriasis 04/24/2007    Past Surgical History:  Procedure Laterality Date  . CESAREAN SECTION  x2 requiring blood transfusion (BTL with 2nd one)  . CHOLECYSTECTOMY    . heart ablation     times 2  . TONSILECTOMY, ADENOIDECTOMY, BILATERAL MYRINGOTOMY AND TUBES       OB History    Gravida  4   Para  2   Term  2   Preterm      AB  2   Living        SAB      IAB  2   Ectopic      Multiple      Live Births              Family History  Problem Relation Age of Onset  . Alcohol abuse Mother   . Cancer Mother        lung, smoker  . Arthritis Sister        walker, uses for balance issues  .  Hypertension Sister   . Anorexia nervosa Daughter   . Breast cancer Paternal Aunt     Social History   Tobacco Use  . Smoking status: Former Smoker    Types: Cigarettes    Quit date: 11/19/2005    Years since quitting: 14.9  . Smokeless tobacco: Never Used  Vaping Use  . Vaping Use: Never used  Substance Use Topics  . Alcohol use: Yes  . Drug use: Yes    Types: Marijuana    Home Medications Prior to Admission medications   Medication Sig Start Date End Date Taking? Authorizing Provider  amphetamine-dextroamphetamine (ADDERALL) 10 MG tablet Take 1 tablet (10 mg total) by mouth daily. December 2021 10/07/20  Yes Bradd Canary, MD  busPIRone (BUSPAR) 5 MG tablet Take 5 mg by mouth 2 (two) times daily. 05/05/20  Yes [provider]  citalopram (CELEXA) 20 MG tablet TAKE 1 AND 1/2 TABLETS BY MOUTH DAILY. Patient taking differently: Take 30 mg by mouth daily. 01/14/20  Yes Bradd Canary, MD  hydrOXYzine (VISTARIL) 25 MG capsule Take 25 mg by mouth every evening. 05/05/20  Yes [provider]  omeprazole (PRILOSEC) 40 MG capsule Take 40 mg by mouth as needed (acid reflux).  11/18/19  Yes [provider]  traZODone (DESYREL) 50 MG tablet Take 50 mg by mouth at bedtime as needed. for sleep 05/05/20  Yes [provider]    Allergies    Tylenol with codeine #3 [acetaminophen-codeine]  Review of Systems   Review of Systems  Skin: Positive for wound.  Psychiatric/Behavioral: Positive for suicidal ideas.  All other systems reviewed and are negative.   Physical Exam Updated Vital Signs BP (!) 153/91   Pulse 86   Temp 98.3 F (36.8 C) (Oral)   Resp 20   Ht 5\' 4"  (1.626 m)   Wt 72.6 kg   LMP 10/10/2016 (Approximate)   SpO2 97%   BMI 27.46 kg/m   Physical Exam Vitals and nursing note reviewed.  Constitutional:      Appearance: Normal appearance. She is well-developed and well-nourished.     Comments: Tearful and anxious appearing  HENT:      Head: Normocephalic and atraumatic.     Mouth/Throat:     Mouth: Oropharynx is clear and moist and mucous membranes are normal.  Eyes:     General: Lids are normal.     Extraocular Movements: EOM normal.     Conjunctiva/sclera: Conjunctivae normal.     Pupils: Pupils are equal, round, and reactive to light.  Cardiovascular:  Rate and Rhythm: Normal rate and regular rhythm.     Pulses: Normal pulses.          Radial pulses are 2+ on the right side and 2+ on the left side.     Heart sounds: Normal heart sounds. No murmur heard. No friction rub. No gallop.   Pulmonary:     Effort: Pulmonary effort is normal.     Breath sounds: Normal breath sounds.     Comments: Lungs clear to auscultation bilaterally.  Symmetric chest rise.  No wheezing, rales, rhonchi. Abdominal:     Palpations: Abdomen is soft. Abdomen is not rigid.     Tenderness: There is no abdominal tenderness. There is no guarding.     Comments: Abdomen is soft, non-distended, non-tender. No rigidity, No guarding. No peritoneal signs.  Musculoskeletal:        General: Normal range of motion.     Cervical back: Full passive range of motion without pain.  Skin:    General: Skin is warm and dry.     Capillary Refill: Capillary refill takes less than 2 seconds.     Comments: 2 cm linear abrasion that has a small laceration (approximately 0.5 cm in length) in the middle portion to the LUE. Older, healing superficial abrasions noted to LUE.   Neurological:     Mental Status: She is alert and oriented to person, place, and time.  Psychiatric:        Mood and Affect: Mood and affect normal.        Speech: Speech normal.        Thought Content: Thought content includes suicidal ideation. Thought content does not include homicidal ideation. Thought content includes suicidal plan. Thought content does not include homicidal plan.     ED Results / Procedures / Treatments   Labs (all labs ordered are listed, but only abnormal  results are displayed) Labs Reviewed  COMPREHENSIVE METABOLIC PANEL - Abnormal; Notable for the following components:      Result Value   CO2 21 (*)    Total Protein 9.0 (*)    Albumin 5.2 (*)    ALT 49 (*)    All other components within normal limits  ETHANOL - Abnormal; Notable for the following components:   Alcohol, Ethyl (B) 127 (*)    All other components within normal limits  SALICYLATE LEVEL - Abnormal; Notable for the following components:   Salicylate Lvl <7.0 (*)    All other components within normal limits  ACETAMINOPHEN LEVEL - Abnormal; Notable for the following components:   Acetaminophen (Tylenol), Serum <10 (*)    All other components within normal limits  RAPID URINE DRUG SCREEN, HOSP PERFORMED - Abnormal; Notable for the following components:   Tetrahydrocannabinol POSITIVE (*)    All other components within normal limits  SARS CORONAVIRUS 2 BY RT PCR (HOSPITAL ORDER, PERFORMED IN [facility])  CBC  I-STAT BETA HCG BLOOD, ED ([facility], [facility], [facility] ONLY)    EKG None  Radiology No results found.  Procedures .Marland KitchenLaceration Repair  Date/Time: 10/19/2020 7:39 PM Performed by: Maxwell Caul, PA-C Authorized by: Maxwell Caul, PA-C   Consent:    Consent obtained:  Verbal   Consent given by:  Patient   Risks discussed:  Infection, need for additional repair, pain, poor cosmetic result and poor wound healing   Alternatives discussed:  No treatment and delayed treatment Universal protocol:    Procedure explained and questions answered to patient or proxy's  satisfaction: yes     Relevant documents present and verified: yes     Test results available: yes     Imaging studies available: yes     Required blood products, implants, devices, and special equipment available: yes     Site/side marked: yes     Immediately prior to procedure, a time out was called: yes     Patient identity confirmed:  Verbally with patient Anesthesia:    Anesthesia method:   None Laceration details:    Location:  Shoulder/arm   Shoulder/arm location:  L lower arm   Length (cm):  0.5 Exploration:    Hemostasis achieved with:  Direct pressure   Wound exploration: wound explored through full range of motion     Wound extent: no foreign bodies/material noted   Treatment:    Area cleansed with:  Saline   Amount of cleaning:  Extensive   Irrigation solution:  Sterile saline   Visualized foreign bodies/material removed: no   Skin repair:    Repair method:  Steri-Strips   Number of Steri-Strips:  3 Approximation:    Approximation:  Close Repair type:    Repair type:  Simple Post-procedure details:    Procedure completion:  Tolerated     Medications Ordered in ED Medications  ibuprofen (ADVIL) tablet 400 mg (has no administration in time range)  Tdap (BOOSTRIX) injection 0.5 mL (0.5 mLs Intramuscular Given 10/19/20 1838)  lidocaine-EPINEPHrine (XYLOCAINE W/EPI) 2 %-1:200000 (PF) injection 10 mL (10 mLs Intradermal Given 10/19/20 2116)  LORazepam (ATIVAN) tablet 1 mg (1 mg Oral Given 10/19/20 2121)    ED Course  I have reviewed the triage vital signs and the nursing notes.  Pertinent labs & imaging results that were available during my care of the patient were reviewed by me and considered in my medical decision making (see chart for details).    MDM Rules/Calculators/A&P                          55 year old female who presents for evaluation of suicidal ideation, cutting/harmed herself.  Reports that she has been under a lot of pressure and states that it count culminated today.  On initial arrival, she is tearful, anxious.  Vital signs are stable.  She does endorse SI to me and states that she was got a plan to overdose.  On exam, she has a 2 cm linear abrasion that is a small laceration in the middle of it where she cut herself today.  She also has other small abrasions that appear to be more chronic in nature.  Given concern for suicidal ideation and  self-harm, we will do medical clearance, TTS consultation.  Patient is voluntary at this time.  5:55 PM: Attempted to call husband but was unsuccessful.   I-STAT beta is negative.  Salicylate, acetaminophen level unremarkable.  Ethanol is 127.  CBC shows no leukocytosis or anemia.  CMP shows bicarb 21.  BUN and creatinine within normal limits.  ALT is 49.  Anion gap is within normal limits.  UDS is positive for marijuana.  I cleaned her wound on her arm.  Further evaluation shows that the wound is very superficial.  There is a small area that is about 0.5 cm that is slightly deeper than the rest of the superficial abrasion but it does not extend deep deeper.  I discussed treatment options with patient including stitches, Steri-Strips.  Given the superficial nature as well as the small area  that is actually a laceration, we did discuss that this would be amenable to Steri-Strips.  Patient is agreeable.  Patient is medically cleared and pending TTS consult.   Portions of this note were generated with Scientist, clinical (histocompatibility and immunogenetics). Dictation errors may occur despite best attempts at proofreading.   Final Clinical Impression(s) / ED Diagnoses Final diagnoses:  Suicidal ideation  Laceration of left upper extremity, initial encounter    Rx / DC Orders ED Discharge Orders    None       Rosana Hoes 10/19/20 2351    Cheryll Cockayne, MD 10/21/20 2235

## 2020-10-19 NOTE — BH Assessment (Signed)
Clinician contacted Triage in an attempt to meet with pt via the Tele-Assessment machine to complete pt's CCA. Clinician called Triage at 2057, 2102, and 2107 but there was no answer. Clinician spoke to hospital personnel at [phone] at 2058 who stated they would inform Triage of clinician's attempts to make contact. There was no answer at [phone] at 2058. TTS will attempt to complete pt's TTA at a later time.

## 2020-10-19 NOTE — ED Notes (Signed)
Psychiatry contacted once TTS cart located. Requested another call for assessment. Denied.

## 2020-10-20 ENCOUNTER — Ambulatory Visit (HOSPITAL_COMMUNITY)
Admission: EM | Admit: 2020-10-20 | Discharge: 2020-10-20 | Disposition: A | Discharge status: Home / Self Care | Payer: BC Managed Care – PPO | Source: Home / Self Care | Attending: Psychiatry | Admitting: Psychiatry

## 2020-10-20 ENCOUNTER — Telehealth: Payer: BC Managed Care – PPO | Admitting: Family Medicine

## 2020-10-20 ENCOUNTER — Other Ambulatory Visit: Payer: Self-pay

## 2020-10-20 DIAGNOSIS — R45851 Suicidal ideations: Secondary | ICD-10-CM | POA: Diagnosis not present

## 2020-10-20 DIAGNOSIS — F1994 Other psychoactive substance use, unspecified with psychoactive substance-induced mood disorder: Secondary | ICD-10-CM

## 2020-10-20 DIAGNOSIS — F411 Generalized anxiety disorder: Secondary | ICD-10-CM | POA: Diagnosis not present

## 2020-10-20 DIAGNOSIS — F332 Major depressive disorder, recurrent severe without psychotic features: Secondary | ICD-10-CM

## 2020-10-20 MED ORDER — HYDROXYZINE HCL 25 MG PO TABS: 25.0000 mg | ORAL_TABLET | Freq: Three times a day (TID) | ORAL | Status: DC | PRN

## 2020-10-20 MED ORDER — AMPHETAMINE-DEXTROAMPHETAMINE 10 MG PO TABS: 10.0000 mg | ORAL_TABLET | Freq: Every day | ORAL | Status: DC

## 2020-10-20 MED ORDER — ADULT MULTIVITAMIN W/MINERALS CH
1.0000 | ORAL_TABLET | Freq: Every day | ORAL | Status: DC
Start: 2020-10-20 — End: 2020-10-20
  Administered 2020-10-20: 1 via ORAL
  Filled 2020-10-20: qty 1

## 2020-10-20 MED ORDER — PANTOPRAZOLE SODIUM 40 MG PO TBEC
40.0000 mg | DELAYED_RELEASE_TABLET | Freq: Every day | ORAL | Status: DC
  Administered 2020-10-20: 40 mg via ORAL
  Filled 2020-10-20: qty 1

## 2020-10-20 MED ORDER — LOPERAMIDE HCL 2 MG PO CAPS: 2.0000 mg | ORAL_CAPSULE | ORAL | Status: DC | PRN

## 2020-10-20 MED ORDER — CITALOPRAM HYDROBROMIDE 20 MG PO TABS
30.0000 mg | ORAL_TABLET | Freq: Every day | ORAL | Status: DC
  Administered 2020-10-20: 30 mg via ORAL
  Filled 2020-10-20: qty 1

## 2020-10-20 MED ORDER — LORAZEPAM 1 MG PO TABS: 1.0000 mg | ORAL_TABLET | Freq: Four times a day (QID) | ORAL | Status: DC | PRN

## 2020-10-20 MED ORDER — ALUM & MAG HYDROXIDE-SIMETH 200-200-20 MG/5ML PO SUSP: 30.0000 mL | ORAL | Status: DC | PRN

## 2020-10-20 MED ORDER — BUSPIRONE HCL 5 MG PO TABS
5.0000 mg | ORAL_TABLET | Freq: Two times a day (BID) | ORAL | Status: DC
  Administered 2020-10-20: 5 mg via ORAL
  Filled 2020-10-20: qty 1

## 2020-10-20 MED ORDER — TRAZODONE HCL 50 MG PO TABS: 50.0000 mg | ORAL_TABLET | Freq: Every evening | ORAL | Status: DC | PRN

## 2020-10-20 MED ORDER — THIAMINE HCL 100 MG PO TABS: 100.0000 mg | ORAL_TABLET | Freq: Every day | ORAL | Status: DC

## 2020-10-20 MED ORDER — MAGNESIUM HYDROXIDE 400 MG/5ML PO SUSP: 30.0000 mL | Freq: Every day | ORAL | Status: DC | PRN

## 2020-10-20 MED ORDER — ONDANSETRON 4 MG PO TBDP: 4.0000 mg | ORAL_TABLET | Freq: Four times a day (QID) | ORAL | Status: DC | PRN

## 2020-10-20 MED ORDER — HYDROXYZINE HCL 25 MG PO TABS: 25.0000 mg | ORAL_TABLET | Freq: Four times a day (QID) | ORAL | Status: DC | PRN

## 2020-10-20 NOTE — BH Assessment (Signed)
Clinician called secretary about getting cart set up.  Was informed that triage was busy with a patient.  Clinician to call back later.

## 2020-10-20 NOTE — ED Notes (Signed)
Patient asked staff is there a reason why the floor is moving? She stated that it appears to be moving.Nurse notified

## 2020-10-20 NOTE — ED Triage Notes (Signed)
Pt arrived to [facility] via [employer] from [place] with chief complaint of SI. Pt states, "things have been escalating at home over last few weeks with my husband and son." Pt reports cutting her arms yesterday "because I had to feel something instead of emotions, I needed to feel a different kind of pain." Pt also reports "taking pills and drinking in August." Pt denies HI and AVH, but reports "seeing things scurry across the room before."

## 2020-10-20 NOTE — ED Notes (Signed)
PATIENT BELONGINGS ARE STORED IN LOCKER #25

## 2020-10-20 NOTE — Discharge Instructions (Signed)

## 2020-10-20 NOTE — ED Provider Notes (Signed)
Behavioral Health Admission H&P [facility] & OBS)  Date: 10/20/20 Patient Name: Sara West MRN: [id number] Chief Complaint:  Chief Complaint  Patient presents with  . Suicidal      Diagnoses:  Final diagnoses:  None    HPI: Sara West is a 55y/o female. Patient presented as a transfer from [facility]. Patient has a hx of bipolar, depression and suicide attempt. Patient presented with complain of suicide ideation. Patient reported that her husband called the ambulance to take her to the ED because she hurt herself. Patient stated "I slit my arm because I was feeling too much emotional pain; I wanted to feel something else." Patient report " Things have been toxic in my household and I just couldn't handle it anymore." Patient stated "My son, Sara West 13y/o has not been coorperative with homeschooling, he refused to brush his teeth and shower and he has not left home since Covid, for almost 2 years now he refused to go outside." Patient reports she is constantly fighting with her husband, because he has been lying to her. Patient report that her husband purchased a motorcycle and kept it in a garage without her knowledge. She stated "I found out about his motorcycle through a text message on his phone, about some guy wanting to buy his bike for $5000." she also report that her husband bought guns without her knowledge. She reports that the guns are kept in a safe in her husband's office. Patient stated "I flipped out about him lying to me." She stated "we didn't speak for two weeks and finally went out to lunch yesterday but it didn't help."  She reports that she and her husband are seeing a marriage counselor because of "trust issues." She reports that her stressors include her daughter been hospitalized for a eating disorder, her son not cooperating with home schooling and adjusting to the pandemic and constantingly fighting with her husband. 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   Patient stated she has been under more stress that usual because "my husband belittles my feelings, he talks to church members about my personal struggles." she states "everything came to a head yesterday and I lost it; I grabbed a razor from the garage and cut my left arm." Patient states "I typically scream at my husband when upset or slam the door but I had to get the emotions out yesterday."  Patient reports that she previously cut her arm once when she was in the 9th or 10th grade in high school. She denies any other history of self harm, although review of chart shows ED visit of overdose in 04/2020.   Patient denies homicidal ideation, she endorses auditory and command hallucination when she is upset. She states "I hear a man's voice telling me to do it, no body cares about you, just do it." She refused to elaborate on what "do it" means. Patient denies illict drug use, she endorse smoking marijuana and drinking heavily when stressed. She denies hx of alcohol withdraw DTs or seizures.  Patient is unable to contract for safety. She stated "I think I will be triggered again if I go home today." She reports that she still has access to  sharps and knives in her kitchen.   PHQ 2-9:  Flowsheet Row ED from 04/28/2020 in [facility] EMERGENCY DEPARTMENT Office Visit from 12/03/2019 in [facility]  Thoughts that you would be better off dead, or of hurting yourself in some way More than half the days More than half the days  PHQ-9 Total Score 11 9      Flowsheet Row ED from 10/20/2020 in [facility] ED from 10/19/2020 in [facility] DEPT ED from 04/28/2020 in [facility] EMERGENCY DEPARTMENT  C-SSRS RISK CATEGORY High Risk High Risk High Risk       Total Time spent with patient: 30 minutes  Musculoskeletal  Strength & Muscle Tone: within normal limits Gait & Station: normal Patient leans: Right  Psychiatric Specialty Exam  Presentation General Appearance: Appropriate for Environment  Eye Contact:Good  Speech:Normal Rate  Speech Volume:Normal  Handedness:Right   Mood and Affect  Mood:Depressed  Affect:Tearful   Thought Process  Thought Processes:Coherent  Descriptions of Associations:Intact  Orientation:Full (Time, Place and Person)  Thought Content:WDL  Hallucinations:Hallucinations: Command Description of Command Hallucinations: "When I'm stressed i hear a man's voice telling me noobody care about you, who cares."  Ideas of Reference:None  Suicidal Thoughts:Suicidal Thoughts: Yes, Active SI Active Intent and/or Plan: With Intent  Homicidal Thoughts:Homicidal Thoughts: No   Sensorium  Memory:Immediate Good  Judgment:Good  Insight:Good   Executive Functions  Concentration:Good  Attention Span:Good  Recall:Good  Fund of Knowledge:Good  Language:Good   Psychomotor Activity  Psychomotor Activity:Psychomotor Activity: Normal   Assets  Assets:Communication Skills; Desire for Improvement; Housing; Social Support; Transportation   Sleep  Sleep:Sleep: Fair   Physical Exam ROS  Blood pressure (!) 146/94, pulse 75, temperature 98.7 F (37.1 C), temperature source Oral, resp. rate 18, height 5\' 4"  (1.626 m), weight 72.6 kg, last menstrual period 10/10/2016, SpO2 97 %. Body mass index is 27.46 kg/m.  Past Psychiatric History: Bipolar, depression   Is the patient at risk to self? Yes  Has the patient been a risk to self in the past 6 months? No .    Has the patient been a risk to self within the distant past? Yes   Is the patient  a risk to others? No   Has the patient been a risk to others in the past 6 months? No   Has the patient been a risk to others within the distant past? No   Past Medical History:  Past Medical History:  Diagnosis Date  . Abnormal liver function test 02/06/2017  . Abnormal thyroid blood test 02/06/2017  . Anxiety 05/22/2007  . Bipolar disorder 05/22/2007   per patient no  . Depression   . Fatigue 10/28/2016  . Leg pain, bilateral 03/05/2013  . Plantar fasciitis, bilateral 03/05/2013  . Psoriasis 04/24/2007  . Weight gain 01/19/2014    Past Surgical History:  Procedure Laterality Date  . CESAREAN SECTION     x2 requiring blood transfusion (BTL with 2nd one)  . CHOLECYSTECTOMY    . heart ablation     times 2  . TONSILECTOMY, ADENOIDECTOMY, BILATERAL MYRINGOTOMY AND TUBES      Family History:  Family History  Problem Relation Age of Onset  . Alcohol abuse Mother   . Cancer Mother        lung, smoker  . Arthritis Sister        walker, uses for balance issues  .  Hypertension Sister   . Anorexia nervosa Daughter   . Breast cancer Paternal Aunt     Social History:  Social History   Socioeconomic History  . Marital status: Married    Spouse name: Not on file  . Number of children: Not on file  . Years of education: Not on file  . Highest education level: Not on file  Occupational History  . Occupation: free lance for [employer]  Tobacco Use  . Smoking status: Former Smoker    Types: Cigarettes    Quit date: 11/19/2005    Years since quitting: 14.9  . Smokeless tobacco: Never Used  Vaping Use  . Vaping Use: Never used  Substance and Sexual Activity  . Alcohol use: Yes  . Drug use: Yes    Types: Marijuana  . Sexual activity: Yes    Birth control/protection: Surgical    Comment: BTL  Other Topics Concern  . Not on file  Social History Narrative  . Not on file   Social Determinants of Health   Financial Resource Strain: Not on file  Food Insecurity: Not  on file  Transportation Needs: Not on file  Physical Activity: Not on file  Stress: Not on file  Social Connections: Not on file  Intimate Partner Violence: Not on file    SDOH:  SDOH Screenings   Alcohol Screen: Not on file  Depression (PHQ2-9): Medium Risk  . PHQ-2 Score: 11  Financial Resource Strain: Not on file  Food Insecurity: Not on file  Housing: Not on file  Physical Activity: Not on file  Social Connections: Not on file  Stress: Not on file  Tobacco Use: Medium Risk  . Smoking Tobacco Use: Former Smoker  . Smokeless Tobacco Use: Never Used  Transportation Needs: Not on file    Last Labs:  Admission on 10/19/2020, Discharged on 10/20/2020  Component Date Value Ref Range Status  . Sodium 10/19/2020 143  135 - 145 mmol/L Final  . Potassium 10/19/2020 4.0  3.5 - 5.1 mmol/L Final  . Chloride 10/19/2020 107  98 - 111 mmol/L Final  . CO2 10/19/2020 21* 22 - 32 mmol/L Final  . Glucose, Bld 10/19/2020 97  70 - 99 mg/dL Final   Glucose reference range applies only to samples taken after fasting for at least 8 hours.  . BUN 10/19/2020 11  6 - 20 mg/dL Final  . Creatinine, Ser 10/19/2020 0.77  0.44 - 1.00 mg/dL Final  . Calcium 71/69/6789 9.7  8.9 - 10.3 mg/dL Final  . Total Protein 10/19/2020 9.0* 6.5 - 8.1 g/dL Final  . Albumin 38/06/1750 5.2* 3.5 - 5.0 g/dL Final  . AST 02/58/5277 35  15 - 41 U/L Final  . ALT 10/19/2020 49* 0 - 44 U/L Final  . Alkaline Phosphatase 10/19/2020 63  38 - 126 U/L Final  . Total Bilirubin 10/19/2020 0.4  0.3 - 1.2 mg/dL Final  . GFR, Estimated 10/19/2020 >60  >60 mL/min Final   Comment: (NOTE) Calculated using the CKD-EPI Creatinine Equation (2021)   . Anion gap 10/19/2020 15  5 - 15 Final   Performed at [facility], [address], [place], [place] [address]  . Alcohol, Ethyl (B) 10/19/2020 127* <10 mg/dL Final   Comment: (NOTE) Lowest detectable limit for serum alcohol is 10 mg/dL.  For medical purposes  only. Performed at [facility], [address], [place], [place] [address]   . Salicylate Lvl 10/19/2020 <7.0* 7.0 - 30.0 mg/dL Final  Performed at [facility], [address] [place], [place] [address]  . Acetaminophen (Tylenol), Serum 10/19/2020 <10* 10 - 30 ug/mL Final   Comment: (NOTE) Therapeutic concentrations vary significantly. A range of 10-30 ug/mL  may be an effective concentration for many patients. However, some  are best treated at concentrations outside of this range. Acetaminophen concentrations >150 ug/mL at 4 hours after ingestion  and >50 ug/mL at 12 hours after ingestion are often associated with  toxic reactions.  Performed at [facility], [address] [place], [place] [address]   . WBC 10/19/2020 8.3  4.0 - 10.5 K/uL Final  . RBC 10/19/2020 4.94  3.87 - 5.11 MIL/uL Final  . Hemoglobin 10/19/2020 14.8  12.0 - 15.0 g/dL Final  . HCT 09/81/1914 45.5  36.0 - 46.0 % Final  . MCV 10/19/2020 92.1  80.0 - 100.0 fL Final  . MCH 10/19/2020 30.0  26.0 - 34.0 pg Final  . MCHC 10/19/2020 32.5  30.0 - 36.0 g/dL Final  . RDW 78/29/5621 12.7  11.5 - 15.5 % Final  . Platelets 10/19/2020 295  150 - 400 K/uL Final  . nRBC 10/19/2020 0.0  0.0 - 0.2 % Final   Performed at [facility], [address], [place], [place] [address]  . Opiates 10/19/2020 NONE DETECTED  NONE DETECTED Final  . Cocaine 10/19/2020 NONE DETECTED  NONE DETECTED Final  . Benzodiazepines 10/19/2020 NONE DETECTED  NONE DETECTED Final  . Amphetamines 10/19/2020 NONE DETECTED  NONE DETECTED Final  . Tetrahydrocannabinol 10/19/2020 POSITIVE* NONE DETECTED Final  . Barbiturates 10/19/2020 NONE DETECTED  NONE DETECTED Final   Comment: (NOTE) DRUG SCREEN FOR MEDICAL PURPOSES ONLY.  IF CONFIRMATION IS NEEDED FOR ANY PURPOSE, NOTIFY LAB WITHIN 5 DAYS.  LOWEST DETECTABLE LIMITS FOR URINE DRUG SCREEN Drug Class                      Cutoff (ng/mL) Amphetamine and metabolites    1000 Barbiturate and metabolites    200 Benzodiazepine                 200 Tricyclics and metabolites     300 Opiates and metabolites        300 Cocaine and metabolites        300 THC                            50 Performed at [facility], [address], [place], [place] [address]   . I-stat hCG, quantitative 10/19/2020 <5.0  <5 mIU/mL Final  . Comment 3 10/19/2020          Final   Comment:   GEST. AGE      CONC.  (mIU/mL)   <=1 WEEK        5 - 50     2 WEEKS       50 - 500     3 WEEKS       100 - 10,000     4 WEEKS     1,000 - 30,000        FEMALE AND NON-PREGNANT FEMALE:     LESS THAN 5 mIU/mL   . SARS Coronavirus 2 10/19/2020 NEGATIVE  NEGATIVE Final   Comment: (NOTE) SARS-CoV-2 target nucleic acids are NOT DETECTED.  The SARS-CoV-2 RNA is generally detectable in upper and lower respiratory specimens during the acute phase of  infection. The lowest concentration of SARS-CoV-2 viral copies this assay can detect is 250 copies / mL. A negative result does not preclude SARS-CoV-2 infection and should not be used as the sole basis for treatment or other patient management decisions.  A negative result may occur with improper specimen collection / handling, submission of specimen other than nasopharyngeal swab, presence of viral mutation(s) within the areas targeted by this assay, and inadequate number of viral copies (<250 copies / mL). A negative result must be combined with clinical observations, patient history, and epidemiological information.  Fact Sheet for Patients:   BoilerBrush.com.cy  Fact Sheet for Healthcare Providers: https://pope.com/  This test is not yet approved or                           cleared by the [place] FDA and has been authorized for detection and/or diagnosis of SARS-CoV-2 by FDA under an Emergency Use Authorization (EUA).  This  EUA will remain in effect (meaning this test can be used) for the duration of the COVID-19 declaration under Section 564(b)(1) of the Act, 21 U.S.C. section 360bbb-3(b)(1), unless the authorization is terminated or revoked sooner.  Performed at [facility], [address] [place], [place] [address]   Admission on 04/28/2020, Discharged on 04/29/2020  Component Date Value Ref Range Status  . Sodium 04/28/2020 138  135 - 145 mmol/L Final  . Potassium 04/28/2020 4.3  3.5 - 5.1 mmol/L Final  . Chloride 04/28/2020 101  98 - 111 mmol/L Final  . CO2 04/28/2020 28  22 - 32 mmol/L Final  . Glucose, Bld 04/28/2020 89  70 - 99 mg/dL Final   Glucose reference range applies only to samples taken after fasting for at least 8 hours.  . BUN 04/28/2020 13  6 - 20 mg/dL Final  . Creatinine, Ser 04/28/2020 0.70  0.44 - 1.00 mg/dL Final  . Calcium 14/43/1540 9.5  8.9 - 10.3 mg/dL Final  . Total Protein 04/28/2020 7.0  6.5 - 8.1 g/dL Final  . Albumin 08/67/6195 4.1  3.5 - 5.0 g/dL Final  . AST 09/32/6712 41  15 - 41 U/L Final  . ALT 04/28/2020 89* 0 - 44 U/L Final  . Alkaline Phosphatase 04/28/2020 62  38 - 126 U/L Final  . Total Bilirubin 04/28/2020 0.4  0.3 - 1.2 mg/dL Final  . GFR calc non Af Amer 04/28/2020 >60  >60 mL/min Final  . GFR calc Af Amer 04/28/2020 >60  >60 mL/min Final  . Anion gap 04/28/2020 9  5 - 15 Final   Performed at [facility], [address] [place], [place] [address]  . Alcohol, Ethyl (B) 04/28/2020 <10  <10 mg/dL Final   Comment: (NOTE) Lowest detectable limit for serum alcohol is 10 mg/dL.  For medical purposes only. Performed at [facility], [address], [place], [place] [address]   . Salicylate Lvl 04/28/2020 <7.0* 7.0 - 30.0 mg/dL Final   Performed at [facility], [address], [place], [place] [address]  . Acetaminophen (Tylenol), Serum 04/28/2020 <10* 10 - 30 ug/mL Final   Comment: (NOTE) Therapeutic concentrations  vary significantly. A range of 10-30 ug/mL  may be an effective concentration for many patients. However, some  are best treated at concentrations outside of this range. Acetaminophen concentrations >150 ug/mL at 4 hours after ingestion  and >50 ug/mL at 12 hours after ingestion are often associated with  toxic reactions.  Performed at [facility], [address] [place], [place] [address]   . WBC 04/28/2020 6.6  4.0 - 10.5 K/uL Final  . RBC 04/28/2020 4.20  3.87 - 5.11 MIL/uL Final  . Hemoglobin 04/28/2020 12.7  12.0 - 15.0 g/dL Final  . HCT 60/45/409808/06/2020 39.3  36.0 - 46.0 % Final  . MCV 04/28/2020 93.6  80.0 - 100.0 fL Final  . MCH 04/28/2020 30.2  26.0 - 34.0 pg Final  . MCHC 04/28/2020 32.3  30.0 - 36.0 g/dL Final  . RDW 11/91/478208/06/2020 12.7  11.5 - 15.5 % Final  . Platelets 04/28/2020 224  150 - 400 K/uL Final  . nRBC 04/28/2020 0.0  0.0 - 0.2 % Final   Performed at [facility], [address] [place], [place] [address]  . Opiates 04/28/2020 NONE DETECTED  NONE DETECTED Final  . Cocaine 04/28/2020 NONE DETECTED  NONE DETECTED Final  . Benzodiazepines 04/28/2020 POSITIVE* NONE DETECTED Final  . Amphetamines 04/28/2020 NONE DETECTED  NONE DETECTED Final  . Tetrahydrocannabinol 04/28/2020 POSITIVE* NONE DETECTED Final  . Barbiturates 04/28/2020 NONE DETECTED  NONE DETECTED Final   Comment: (NOTE) DRUG SCREEN FOR MEDICAL PURPOSES ONLY.  IF CONFIRMATION IS NEEDED FOR ANY PURPOSE, NOTIFY LAB WITHIN 5 DAYS.  LOWEST DETECTABLE LIMITS FOR URINE DRUG SCREEN Drug Class                     Cutoff (ng/mL) Amphetamine and metabolites    1000 Barbiturate and metabolites    200 Benzodiazepine                 200 Tricyclics and metabolites     300 Opiates and metabolites        300 Cocaine and metabolites        300 THC                            50 Performed at [facility], [address] [place], [place] [address]   . I-stat hCG, quantitative 04/28/2020 <5.0  <5  mIU/mL Final  . Comment 3 04/28/2020          Final   Comment:   GEST. AGE      CONC.  (mIU/mL)   <=1 WEEK        5 - 50     2 WEEKS       50 - 500     3 WEEKS       100 - 10,000     4 WEEKS     1,000 - 30,000        FEMALE AND NON-PREGNANT FEMALE:     LESS THAN 5 mIU/mL   . SARS Coronavirus 2 04/29/2020 NEGATIVE  NEGATIVE Final   Comment: (NOTE) SARS-CoV-2 target nucleic acids are NOT DETECTED.  The SARS-CoV-2 RNA is generally detectable in upper and lower respiratory specimens during the acute phase of infection. The lowest concentration of SARS-CoV-2 viral copies this assay can detect is 250 copies / mL. A negative result does not preclude SARS-CoV-2 infection and should not be used as the sole basis for treatment or other patient management decisions.  A negative result may occur with improper specimen collection / handling, submission of specimen other than nasopharyngeal swab, presence of viral mutation(s) within the areas targeted by this assay, and inadequate number of viral copies (<250 copies / mL). A negative result  must be combined with clinical observations, patient history, and epidemiological information.  Fact Sheet for Patients:   BoilerBrush.com.cy  Fact Sheet for Healthcare Providers: https://pope.com/  This test is not yet approved or                           cleared by the [place] FDA and has been authorized for detection and/or diagnosis of SARS-CoV-2 by FDA under an Emergency Use Authorization (EUA).  This EUA will remain in effect (meaning this test can be used) for the duration of the COVID-19 declaration under Section 564(b)(1) of the Act, 21 U.S.C. section 360bbb-3(b)(1), unless the authorization is terminated or revoked sooner.  Performed at [facility], [address], [place], [place] [address]     Allergies: Tylenol with codeine #3 [acetaminophen-codeine]  PTA Medications: (Not  in a hospital admission)   Medical Decision Making  Admit patient to [facility] for observation  Continue home medication [place] protocol      Recommendations  Based on my evaluation the patient appears to have an emergency medical condition for which I recommend the patient be transferred to the emergency department for further evaluation.  Maricela Bo, NP 10/20/20  5:12 AM

## 2020-10-20 NOTE — ED Provider Notes (Signed)
[facility] ASAP Discharge Summary  Date and Time: 10/20/2020 9:42 AM  Name: Sara West  MRN:  [id number]   Discharge Diagnoses:  Final diagnoses:  Suicide ideation  Severe recurrent major depression without psychotic features (HCC)    Subjective:  States she feels better today and slept well. Explains that yesterday she had "maybe two glasses of wine," and was in a heated argument with her husband before cutting her left arm with a knife. Patient was intoxicated in ED with BAL of 127. Multiple superficial linear cuts up and down left arm, several have steri-strips. She denies that this was a suicide attempt, explains that "I wanted to feel a different kind of pain." Identifies stress of homeschooling her son who "never leaves the house since COVID, and is always in a bad mood. I have to make him take showers, he smells so bad" and her daughter who has multiple mental health issues. Main stressor is multiple heated arguments with husband. "I get so angry I lose control, yell, slam doors. This time I cut my arm." She needs a lot of redirection during interview as she brings up multiple examples of arguments.  Past week or more reports feeling "down and depressed" reporting loss of interest/pleasure in doing things, feeling hopeless and helpless, poor concentration, and lack of energy. In addition to reported outbursts during arguments, describes several days "two weeks ago of excessive energy "I was upbeat and cleaning and getting a lot of things done."  Denies SI/HI today, explains she has intermittent passive SI without intent at times.Today endorses illusions of the "floor moving." Says at times she sees things "scurrying across the floor." Describes past history of auditory hallucinations telling her to "end it" and that she is "worthless." " States "I would never kill myself, I couldn't do that to my family." She states her husband recently bought 4 guns, but "they are in a safe that only he knows  the combination to." Agreeable to contacting husband for collateral and safety plan.   UDS positive for marijuana. States drinking "ocassionally, when I'm really stressed" and "probably more than I should at times, but never daily."  Admits to occasional marijuana use. Denies history of alcohol withdrawal. No alcohol withdrawal symptoms reported or observed.   Expresses desire to be discharged "with resources." She believes she can be safe at home with husband and children. Reports having follow up with therapist at the [facility] in a "week or two" and agrees to contact [facility] for psychiatry follow up.  Collateral- Innocence Schlotzhauer (husband) [phone] Called at 9:42 AM States that she was upset about something and she cut her arm. Denies an argument occurred between them but states that that was upset about "something the day before". States that she has not been to [facility] for [facility] up recently and indicates that it has been "months". She was doing an [facility]. States that they had done marriage counseling months ago but are not currently attending, although feels that it could have been helpful. States that since she has been here that she screamed at him over the phone. He expresses concern about her substance use and unwillingness to seek treatment. She previously went to AA but then was "abandoned" by per sponsor. Feels that substance use is problematic. Discussed that she does not currently meet inpatient criteria; he states that he work until 3-4PM can come pick her up after.    Stay Summary:  Sara West is  a 55y/o female. Patient presented as a transfer from [facility]. Patient has a hx of bipolar, depression and suicide attempt. Patient presented with complain of suicide ideation. Patient reported that her husband called the ambulance to take her to the ED because she hurt herself. Patient stated "I slit my arm because I was feeling too much emotional pain; I  wanted to feel something else." Patient report " Things have been toxic in my household and I just couldn't handle it anymore." Patient stated "My son, Sara West 13y/o has not been coorperative with homeschooling, he refused to brush his teeth and shower and he has not left home since Covid, for almost 2 years now he refused to go outside." Patient reports she is constantly fighting with her husband, because he has been lying to her. Patient report that her husband purchased a motorcycle and kept it in a garage without her knowledge. She stated "I found out about his motorcycle through a text message on his phone, about some guy wanting to buy his bike for $5000." she also report that her husband bought guns without her knowledge. She reports that the guns are kept in a safe in her husband's office. Patient stated "I flipped out about him lying to me." She stated "we didn't speak for two weeks and finally went out to lunch yesterday but it didn't help."  She reports that she and her husband are seeing a marriage counselor because of "trust issues." She reports that her stressors include her daughter been hospitalized for a eating disorder, her son not cooperating with home schooling and adjusting to the pandemic and constantingly fighting with her husband. 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  Patient was admitted overnight for safety and stabilization. The following day she denied SI/HI/AVH and felt safe for discharge. Some suspicion about minimizing substance use; discussed IOP with patient but is not currently interested in this time. Spoke with husband on day of discharge, stated he will come pick her up  Total Time spent  with patient: 30 minutes  Past Psychiatric History: Reported bipolar disorder, depression Past Medical History:  Past Medical History:  Diagnosis Date  . Abnormal liver function test 02/06/2017  . Abnormal thyroid blood test 02/06/2017  . Anxiety 05/22/2007  . Bipolar disorder 05/22/2007   per patient no  . Depression   . Fatigue 10/28/2016  . Leg pain, bilateral 03/05/2013  . Plantar fasciitis, bilateral 03/05/2013  . Psoriasis 04/24/2007  . Weight gain 01/19/2014    Past Surgical History:  Procedure Laterality Date  . CESAREAN SECTION     x2 requiring blood transfusion (BTL with 2nd one)  . CHOLECYSTECTOMY    . heart ablation     times 2  . TONSILECTOMY, ADENOIDECTOMY, BILATERAL MYRINGOTOMY AND TUBES     Family History:  Family History  Problem Relation Age of Onset  . Alcohol abuse Mother   . Cancer Mother        lung, smoker  . Arthritis Sister        walker, uses for balance issues  . Hypertension Sister   . Anorexia nervosa Daughter   . Breast cancer Paternal Aunt    Family Psychiatric History: Reports daughter  has eating disorder and anxiety issues. Social History:  Social History   Substance and Sexual Activity  Alcohol Use Yes     Social History   Substance and Sexual Activity  Drug Use Yes  . Types: Marijuana    Social History   Socioeconomic History  . Marital status: Married    Spouse name: Not on file  . Number of children: Not on file  . Years of education: Not on file  . Highest education level: Not on file  Occupational History  . Occupation: free lance for [employer]  Tobacco Use  . Smoking status: Former Smoker    Types: Cigarettes    Quit date: 11/19/2005    Years since quitting: 14.9  . Smokeless tobacco: Never Used  Vaping Use  . Vaping Use: Never used  Substance and Sexual Activity  . Alcohol use: Yes  . Drug use: Yes    Types: Marijuana  . Sexual activity: Yes    Birth control/protection: Surgical    Comment: BTL   Other Topics Concern  . Not on file  Social History Narrative  . Not on file   Social Determinants of Health   Financial Resource Strain: Not on file  Food Insecurity: Not on file  Transportation Needs: Not on file  Physical Activity: Not on file  Stress: Not on file  Social Connections: Not on file   SDOH:  SDOH Screenings   Alcohol Screen: Not on file  Depression (PHQ2-9): Medium Risk  . PHQ-2 Score: 11  Financial Resource Strain: Not on file  Food Insecurity: Not on file  Housing: Not on file  Physical Activity: Not on file  Social Connections: Not on file  Stress: Not on file  Tobacco Use: Medium Risk  . Smoking Tobacco Use: Former Smoker  . Smokeless Tobacco Use: Never Used  Transportation Needs: Not on file    Has this patient used any form of tobacco in the last 30 days? (Cigarettes, Smokeless Tobacco, Cigars, and/or Pipes) Prescription not provided because: n/a  Current Medications:  Current Facility-Administered Medications  Medication Dose Route Frequency Provider Last Rate Last Admin  . alum & mag hydroxide-simeth (MAALOX/MYLANTA) 200-200-20 MG/5ML suspension 30 mL  30 mL Oral Q4H PRN Ajibola, Ene A, NP      . busPIRone (BUSPAR) tablet 5 mg  5 mg Oral BID Ajibola, Ene A, NP   5 mg at 10/20/20 0842  . citalopram (CELEXA) tablet 30 mg  30 mg Oral Daily Ajibola, Ene A, NP   30 mg at 10/20/20 1856  . hydrOXYzine (ATARAX/VISTARIL) tablet 25 mg  25 mg Oral TID PRN Ajibola, Ene A, NP      . loperamide (IMODIUM) capsule 2-4 mg  2-4 mg Oral PRN Ajibola, Ene A, NP      . LORazepam (ATIVAN) tablet 1 mg  1 mg Oral Q6H PRN Ajibola, Ene A, NP      . magnesium hydroxide (MILK OF MAGNESIA) suspension 30 mL  30 mL Oral Daily PRN Ajibola, Ene A, NP      . multivitamin with minerals tablet 1 tablet  1 tablet Oral Daily Ajibola, Ene A, NP   1 tablet at 10/20/20 0841  . ondansetron (ZOFRAN-ODT) disintegrating tablet 4 mg  4 mg Oral Q6H PRN Ajibola, Ene A, NP      . pantoprazole  (PROTONIX) EC tablet 40 mg  40 mg Oral Daily Ajibola, Ene A, NP   40 mg at 10/20/20 0843  . [START ON 10/21/2020] thiamine  tablet 100 mg  100 mg Oral Daily Ajibola, Ene A, NP      . traZODone (DESYREL) tablet 50 mg  50 mg Oral QHS PRN Ajibola, Ene A, NP       Current Outpatient Medications  Medication Sig Dispense Refill  . amphetamine-dextroamphetamine (ADDERALL) 10 MG tablet Take 1 tablet (10 mg total) by mouth daily. December 2021 30 tablet 0  . busPIRone (BUSPAR) 5 MG tablet Take 5 mg by mouth 2 (two) times daily.    . citalopram (CELEXA) 20 MG tablet TAKE 1 AND 1/2 TABLETS BY MOUTH DAILY. (Patient taking differently: Take 30 mg by mouth daily.) 135 tablet 1  . hydrOXYzine (VISTARIL) 25 MG capsule Take 25 mg by mouth every evening.    Marland Kitchen omeprazole (PRILOSEC) 40 MG capsule Take 40 mg by mouth as needed (acid reflux).     . traZODone (DESYREL) 50 MG tablet Take 50 mg by mouth at bedtime as needed. for sleep      PTA Medications: (Not in a hospital admission)   Musculoskeletal  Strength & Muscle Tone: within normal limits Gait & Station: normal Patient leans: N/A  Psychiatric Specialty Exam  Presentation  General Appearance: Appropriate for Environment  Eye Contact:Good  Speech:Normal Rate  Speech Volume:Normal  Handedness:Right   Mood and Affect  Mood:Depressed  Affect:Tearful   Thought Process  Thought Processes:Coherent  Descriptions of Associations:Intact  Orientation:Full (Time, Place and Person)  Thought Content:WDL  Hallucinations:Hallucinations: Command Description of Command Hallucinations: "When I'm stressed i hear a man's voice telling me noobody care about you, who cares."  Ideas of Reference:None  Suicidal Thoughts:Suicidal Thoughts: Yes, Active SI Active Intent and/or Plan: With Intent  Homicidal Thoughts:Homicidal Thoughts: No   Sensorium  Memory:Immediate Good  Judgment:Good  Insight:Good   Executive Functions   Concentration:Good  Attention Span:Good  Recall:Good  Fund of Knowledge:Good  Language:Good   Psychomotor Activity  Psychomotor Activity:Psychomotor Activity: Normal   Assets  Assets:Communication Skills; Desire for Improvement; Housing; Social Support; Transportation   Sleep  Sleep:Sleep: Fair   Physical Exam  Physical Exam HENT:     Head: Normocephalic and atraumatic.     Nose: Nose normal.  Eyes:     Conjunctiva/sclera: Conjunctivae normal.     Pupils: Pupils are equal, round, and reactive to light.  Pulmonary:     Effort: Pulmonary effort is normal.  Musculoskeletal:        General: Normal range of motion.     Cervical back: Normal range of motion.  Neurological:     Mental Status: She is alert.    Review of Systems  Psychiatric/Behavioral: Positive for depression, hallucinations and substance abuse. Negative for suicidal ideas.   Blood pressure 127/87, pulse 82, temperature 98.9 F (37.2 C), temperature source Oral, resp. rate 16, height 5\' 4"  (1.626 m), weight 72.6 kg, last menstrual period 10/10/2016, SpO2 97 %. Body mass index is 27.46 kg/m.  Demographic Factors:  Caucasian  Loss Factors: NA  Historical Factors: Prior suicide attempts and Impulsivity  Risk Reduction Factors:   Responsible for children under 67 years of age, Sense of responsibility to family, Employed and Living with another person, especially a relative  Continued Clinical Symptoms:  Severe Anxiety and/or Agitation Depression:   Anhedonia Comorbid alcohol abuse/dependence Hopelessness Impulsivity Alcohol/Substance Abuse/Dependencies  Cognitive Features That Contribute To Risk:  None    Suicide Risk:  Minimal: No identifiable suicidal ideation.  Patients presenting with no risk factors but with morbid ruminations; may be classified as minimal risk  based on the severity of the depressive symptoms  Plan Of Care/Follow-up recommendations:  Activity:  As tolerated Diet:   Regular Other:  Follow up with outpatient psychiatry and therapy  Disposition: Sent home, recommended patient follow up with outpatient provider.  Estella HuskKatherine S Kalil Woessner, MD 10/20/2020, 9:42 AM

## 2020-10-20 NOTE — Discharge Summary (Signed)
[name] to be D/C'd Home per MD order.  Discussed with the patient and all questions fully answered.  An After Visit Summary was printed and given to the patient. [facility].  D/c education completed with patient/family including follow up instructions, medication list, d/c activities limitations if indicated, with other d/c instructions as indicated by MD - patient able to verbalize understanding, all questions fully answered.   Patient instructed to return to ED, call 911, or call MD for any changes in condition.   Patient escorted via WC, and D/C home via private auto.  Leamon Arnt 10/20/2020 5:05 PM
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
## 2020-10-20 NOTE — ED Notes (Signed)
Pt awake in flex talking with PA. No s/s pain or discomfort. Notified that pt is having AH; asked why floor is moving. Will continue to monitor for safety

## 2020-10-21 ENCOUNTER — Encounter: Payer: Self-pay | Admitting: Family Medicine

## 2020-11-03 DIAGNOSIS — F411 Generalized anxiety disorder: Secondary | ICD-10-CM | POA: Diagnosis not present

## 2020-11-03 DIAGNOSIS — F1020 Alcohol dependence, uncomplicated: Secondary | ICD-10-CM | POA: Diagnosis not present

## 2020-11-05 DIAGNOSIS — F411 Generalized anxiety disorder: Secondary | ICD-10-CM | POA: Diagnosis not present

## 2020-11-06 ENCOUNTER — Other Ambulatory Visit: Payer: Self-pay | Admitting: Family Medicine

## 2020-11-06 ENCOUNTER — Encounter: Payer: Self-pay | Admitting: Family Medicine

## 2020-11-06 MED ORDER — TRAZODONE HCL 50 MG PO TABS: 50.0000 mg | ORAL_TABLET | Freq: Every evening | ORAL | 1 refills | Status: DC | PRN

## 2020-11-06 MED ORDER — BUSPIRONE HCL 5 MG PO TABS: 5.0000 mg | ORAL_TABLET | Freq: Two times a day (BID) | ORAL | 1 refills | Status: DC

## 2020-11-10 DIAGNOSIS — F411 Generalized anxiety disorder: Secondary | ICD-10-CM | POA: Diagnosis not present

## 2020-11-10 DIAGNOSIS — F1020 Alcohol dependence, uncomplicated: Secondary | ICD-10-CM | POA: Diagnosis not present

## 2020-11-17 DIAGNOSIS — F411 Generalized anxiety disorder: Secondary | ICD-10-CM | POA: Diagnosis not present

## 2020-11-19 DIAGNOSIS — F411 Generalized anxiety disorder: Secondary | ICD-10-CM | POA: Diagnosis not present

## 2020-11-24 DIAGNOSIS — F411 Generalized anxiety disorder: Secondary | ICD-10-CM | POA: Diagnosis not present

## 2020-11-24 DIAGNOSIS — F1020 Alcohol dependence, uncomplicated: Secondary | ICD-10-CM | POA: Diagnosis not present

## 2020-11-26 ENCOUNTER — Other Ambulatory Visit: Payer: Self-pay | Admitting: Family Medicine

## 2020-11-26 MED ORDER — AMPHETAMINE-DEXTROAMPHETAMINE 10 MG PO TABS: 10.0000 mg | ORAL_TABLET | Freq: Every day | ORAL | 0 refills | Status: DC

## 2020-11-26 NOTE — Telephone Encounter (Signed)
Requesting: Adderall Contract: 12/03/19 UDS: 12/03/19 Last Visit: 12/03/19 Next Visit: 12/10/20 Last Refill: 10/07/20  Please Advise

## 2020-11-29 ENCOUNTER — Other Ambulatory Visit: Payer: Self-pay | Admitting: Family Medicine

## 2020-11-30 ENCOUNTER — Other Ambulatory Visit: Payer: Self-pay | Admitting: Family Medicine

## 2020-11-30 MED ORDER — AMPHETAMINE-DEXTROAMPHETAMINE 10 MG PO TABS
10.0000 mg | ORAL_TABLET | Freq: Every day | ORAL | 0 refills | Status: DC
Start: 2020-11-30 — End: 2020-12-29

## 2020-11-30 NOTE — Telephone Encounter (Signed)
Dr. Abner Greenspan this rx did not go through on 3/10.  Can you please resend?

## 2020-12-01 DIAGNOSIS — F411 Generalized anxiety disorder: Secondary | ICD-10-CM | POA: Diagnosis not present

## 2020-12-03 DIAGNOSIS — F411 Generalized anxiety disorder: Secondary | ICD-10-CM | POA: Diagnosis not present

## 2020-12-10 ENCOUNTER — Encounter: Payer: Self-pay | Admitting: Family Medicine

## 2020-12-10 ENCOUNTER — Telehealth: Payer: BC Managed Care – PPO | Admitting: Family Medicine

## 2020-12-10 ENCOUNTER — Other Ambulatory Visit: Payer: Self-pay

## 2020-12-10 NOTE — Telephone Encounter (Signed)
Pt will be seen tomorrow at 2:40

## 2020-12-10 NOTE — Progress Notes (Signed)
Patient not seen.

## 2020-12-11 ENCOUNTER — Telehealth (INDEPENDENT_AMBULATORY_CARE_PROVIDER_SITE_OTHER): Payer: BC Managed Care – PPO | Admitting: Family Medicine

## 2020-12-11 ENCOUNTER — Other Ambulatory Visit: Payer: Self-pay

## 2020-12-11 DIAGNOSIS — F32A Depression, unspecified: Secondary | ICD-10-CM

## 2020-12-11 DIAGNOSIS — G4700 Insomnia, unspecified: Secondary | ICD-10-CM | POA: Diagnosis not present

## 2020-12-11 DIAGNOSIS — R41840 Attention and concentration deficit: Secondary | ICD-10-CM

## 2020-12-11 DIAGNOSIS — F419 Anxiety disorder, unspecified: Secondary | ICD-10-CM | POA: Diagnosis not present

## 2020-12-11 DIAGNOSIS — N289 Disorder of kidney and ureter, unspecified: Secondary | ICD-10-CM

## 2020-12-12 MED ORDER — CITALOPRAM HYDROBROMIDE 20 MG PO TABS: 40.0000 mg | ORAL_TABLET | Freq: Every day | ORAL | 1 refills | Status: DC

## 2020-12-12 MED ORDER — HYDROXYZINE HCL 10 MG PO TABS: 5.0000 mg | ORAL_TABLET | Freq: Three times a day (TID) | ORAL | 1 refills | Status: DC | PRN

## 2020-12-12 NOTE — Assessment & Plan Note (Signed)
She is tolerating Adderall

## 2020-12-12 NOTE — Assessment & Plan Note (Signed)
She has been struggling with significant stress due to her daughter's anorexia and her son's agoraphobia and refusal to return to school. Patient has ben following with psychiatry and they have added Buspar and increased the Citalopram to 40 mg daily. She had been doing very poorly with excessive alcohol use and cutting behavior. Which resulted in admittance to [facility] for alcohol treatment and then a trip to the hospital with an episode of deep cutting. She then began counseling and in depth work with the [facility]. She feels she is doing much better but agrees to return to psychiatry and counseling. The Hydroxyzine at 25 mg is overly sedating. She is given a prescription for the 10 mg tablets and she can try 5-20 as needed for anxiety and insomnia.

## 2020-12-12 NOTE — Assessment & Plan Note (Signed)
Supplement and monitor 

## 2020-12-12 NOTE — Progress Notes (Signed)
MyChart Video Visit    Virtual Visit via Video Note   This visit type was conducted due to national recommendations for restrictions regarding the COVID-19 Pandemic (e.g. social distancing) in an effort to limit this patient's exposure and mitigate transmission in our community. This patient is at least at moderate risk for complications without adequate follow up. This format is felt to be most appropriate for this patient at this time. Physical exam was limited by quality of the video and audio technology used for the visit. S Chism, CMA was able to get the patient set up on a video visit.  Patient location: Home Patient and provider in visit Provider location: Office  I discussed the limitations of evaluation and management by telemedicine and the availability of in person appointments. The patient expressed understanding and agreed to proceed.  Visit Date: 12/11/2020  Today's healthcare provider: Danise Edge, MD     Subjective:    Patient ID: Sara West, female    DOB: 11/10/1965, 55 y.o.   MRN: [id number]  Chief Complaint  Patient presents with  . Follow-up    HPI Patient is in today for evaluation of ongoing trouble with depression. She has been struggling with significant stress due to her daughter's anorexia and her son's agoraphobia and refusal to return to school. Patient has ben following with psychiatry and they have added Buspar and increased the Citalopram to 40 mg daily. She had been doing very poorly with excessive alcohol use and cutting behavior. Which resulted in admittance to [facility] for alcohol treatment and then a trip to the hospital with an episode of deep cutting. She then began counseling and in depth work with the [facility]. She feels she is doing much better but agrees to return to psychiatry and counseling. The Hydroxyzine at 25 mg is overly sedating. Denies CP/palp/SOB/HA/congestion/fevers/GI or GU c/o. Taking meds as prescribed  Past  Medical History:  Diagnosis Date  . Abnormal liver function test 02/06/2017  . Abnormal thyroid blood test 02/06/2017  . Anxiety 05/22/2007  . Bipolar disorder 05/22/2007   per patient no  . Depression   . Fatigue 10/28/2016  . Leg pain, bilateral 03/05/2013  . Plantar fasciitis, bilateral 03/05/2013  . Psoriasis 04/24/2007  . Weight gain 01/19/2014    Past Surgical History:  Procedure Laterality Date  . CESAREAN SECTION     x2 requiring blood transfusion (BTL with 2nd one)  . CHOLECYSTECTOMY    . heart ablation     times 2  . TONSILECTOMY, ADENOIDECTOMY, BILATERAL MYRINGOTOMY AND TUBES      Family History  Problem Relation Age of Onset  . Alcohol abuse Mother   . Cancer Mother        lung, smoker  . Arthritis Sister        walker, uses for balance issues  . Hypertension Sister   . Anorexia nervosa Daughter   . Breast cancer Paternal Aunt     Social History   Socioeconomic History  . Marital status: Married    Spouse name: Not on file  . Number of children: Not on file  . Years of education: Not on file  . Highest education level: Not on file  Occupational History  . Occupation: free lance for [employer]  Tobacco Use  . Smoking status: Former Smoker    Types: Cigarettes    Quit date: 11/19/2005    Years since quitting: 15.0  . Smokeless tobacco: Never Used  Vaping Use  .  Vaping Use: Never used  Substance and Sexual Activity  . Alcohol use: Yes  . Drug use: Yes    Types: Marijuana  . Sexual activity: Yes    Birth control/protection: Surgical    Comment: BTL  Other Topics Concern  . Not on file  Social History Narrative  . Not on file   Social Determinants of Health   Financial Resource Strain: Not on file  Food Insecurity: Not on file  Transportation Needs: Not on file  Physical Activity: Not on file  Stress: Not on file  Social Connections: Not on file  Intimate Partner Violence: Not on file    Outpatient Medications Prior to Visit   Medication Sig Dispense Refill  . amphetamine-dextroamphetamine (ADDERALL) 10 MG tablet Take 1 tablet (10 mg total) by mouth daily. March 2022 30 tablet 0  . busPIRone (BUSPAR) 5 MG tablet TAKE 1 TABLET BY MOUTH TWICE A DAY 180 tablet 0  . hydrOXYzine (VISTARIL) 25 MG capsule Take 25 mg by mouth every evening.    . traZODone (DESYREL) 50 MG tablet TAKE 1 TABLET BY MOUTH AT BEDTIME AS NEEDED FOR SLEEP 90 tablet 0  . citalopram (CELEXA) 20 MG tablet TAKE 1 AND 1/2 TABLETS BY MOUTH DAILY. (Patient taking differently: Take 30 mg by mouth daily.) 135 tablet 1   No facility-administered medications prior to visit.    Allergies  Allergen Reactions  . Tylenol With Codeine #3 [Acetaminophen-Codeine] Nausea Only    Review of Systems  Constitutional: Positive for malaise/fatigue. Negative for fever.  HENT: Negative for congestion.   Eyes: Negative for blurred vision.  Respiratory: Negative for shortness of breath.   Cardiovascular: Negative for chest pain, palpitations and leg swelling.  Gastrointestinal: Negative for abdominal pain, blood in stool and nausea.  Genitourinary: Negative for dysuria and frequency.  Musculoskeletal: Negative for falls.  Skin: Negative for rash.  Neurological: Negative for dizziness, loss of consciousness and headaches.  Endo/Heme/Allergies: Negative for environmental allergies.  Psychiatric/Behavioral: Positive for depression. The patient is nervous/anxious and has insomnia.        Objective:    Physical Exam Constitutional:      Appearance: Normal appearance. She is not ill-appearing.  HENT:     Head: Normocephalic and atraumatic.     Right Ear: External ear normal.     Left Ear: External ear normal.     Nose: Nose normal.  Eyes:     General:        Right eye: No discharge.        Left eye: No discharge.  Pulmonary:     Effort: Pulmonary effort is normal.  Neurological:     Mental Status: She is alert and oriented to person, place, and time.   Psychiatric:        Behavior: Behavior normal.     LMP 10/10/2016 (Approximate)  Wt Readings from Last 3 Encounters:  10/19/20 160 lb (72.6 kg)  10/07/20 167 lb 12.8 oz (76.1 kg)  08/05/20 167 lb 3.2 oz (75.8 kg)    Diabetic Foot Exam - Simple   No data filed    Lab Results  Component Value Date   WBC 8.3 10/19/2020   HGB 14.8 10/19/2020   HCT 45.5 10/19/2020   PLT 295 10/19/2020   GLUCOSE 97 10/19/2020   CHOL 188 12/03/2019   TRIG 87.0 12/03/2019   HDL 68.50 12/03/2019   LDLCALC 102 (H) 12/03/2019   ALT 49 (H) 10/19/2020   AST 35 10/19/2020   NA 143  10/19/2020   K 4.0 10/19/2020   CL 107 10/19/2020   CREATININE 0.77 10/19/2020   BUN 11 10/19/2020   CO2 21 (L) 10/19/2020   TSH 2.62 12/03/2019   HGBA1C 6.2 12/03/2019    Lab Results  Component Value Date   TSH 2.62 12/03/2019   Lab Results  Component Value Date   WBC 8.3 10/19/2020   HGB 14.8 10/19/2020   HCT 45.5 10/19/2020   MCV 92.1 10/19/2020   PLT 295 10/19/2020   Lab Results  Component Value Date   NA 143 10/19/2020   K 4.0 10/19/2020   CO2 21 (L) 10/19/2020   GLUCOSE 97 10/19/2020   BUN 11 10/19/2020   CREATININE 0.77 10/19/2020   BILITOT 0.4 10/19/2020   ALKPHOS 63 10/19/2020   AST 35 10/19/2020   ALT 49 (H) 10/19/2020   PROT 9.0 (H) 10/19/2020   ALBUMIN 5.2 (H) 10/19/2020   CALCIUM 9.7 10/19/2020   ANIONGAP 15 10/19/2020   GFR 85.92 12/03/2019   Lab Results  Component Value Date   CHOL 188 12/03/2019   Lab Results  Component Value Date   HDL 68.50 12/03/2019   Lab Results  Component Value Date   LDLCALC 102 (H) 12/03/2019   Lab Results  Component Value Date   TRIG 87.0 12/03/2019   Lab Results  Component Value Date   CHOLHDL 3 12/03/2019   Lab Results  Component Value Date   HGBA1C 6.2 12/03/2019       Assessment & Plan:   Problem List Items Addressed This Visit    Insomnia    Encouraged good sleep hygiene such as dark, quiet room. No blue/green glowing lights  such as computer screens in bedroom. No alcohol or stimulants in evening. Cut down on caffeine as able. Regular exercise is helpful but not just prior to bed time. Uses Trazodone intermittently can try Hydroxyzine prn instead also.       Renal insufficiency    Supplement and monitor      Anxiety and depression    She has been struggling with significant stress due to her daughter's anorexia and her son's agoraphobia and refusal to return to school. Patient has ben following with psychiatry and they have added Buspar and increased the Citalopram to 40 mg daily. She had been doing very poorly with excessive alcohol use and cutting behavior. Which resulted in admittance to [facility] for alcohol treatment and then a trip to the hospital with an episode of deep cutting. She then began counseling and in depth work with the [facility]. She feels she is doing much better but agrees to return to psychiatry and counseling. The Hydroxyzine at 25 mg is overly sedating. She is given a prescription for the 10 mg tablets and she can try 5-20 as needed for anxiety and insomnia.       Relevant Medications   citalopram (CELEXA) 20 MG tablet   hydrOXYzine (ATARAX/VISTARIL) 10 MG tablet   Attention or concentration deficit    She is tolerating Adderall          I have discontinued Sara West's traZODone. I have also changed her citalopram. Additionally, I am having her start on hydrOXYzine. Lastly, I am having her maintain her hydrOXYzine, busPIRone, and amphetamine-dextroamphetamine.  Meds ordered this encounter  Medications  . citalopram (CELEXA) 20 MG tablet    Sig: Take 2 tablets (40 mg total) by mouth daily.    Dispense:  180 tablet    Refill:  1  .  hydrOXYzine (ATARAX/VISTARIL) 10 MG tablet    Sig: Take 0.5-2 tablets (5-20 mg total) by mouth every 8 (eight) hours as needed for anxiety (insomnia).    Dispense:  40 tablet    Refill:  1    I discussed the assessment and treatment plan  with the patient. The patient was provided an opportunity to ask questions and all were answered. The patient agreed with the plan and demonstrated an understanding of the instructions.   The patient was advised to call back or seek an in-person evaluation if the symptoms worsen or if the condition fails to improve as anticipated.  I provided 20 minutes of face-to-face time during this encounter.   Danise EdgeStacey Zeinab Rodwell, MD [facility] [phone] (phone) [phone] (fax)  [facility]

## 2020-12-12 NOTE — Assessment & Plan Note (Signed)
Encouraged good sleep hygiene such as dark, quiet room. No blue/green glowing lights such as computer screens in bedroom. No alcohol or stimulants in evening. Cut down on caffeine as able. Regular exercise is helpful but not just prior to bed time. Uses Trazodone intermittently can try Hydroxyzine prn instead also.

## 2020-12-14 ENCOUNTER — Telehealth: Payer: Self-pay

## 2020-12-15 ENCOUNTER — Telehealth: Payer: BC Managed Care – PPO | Admitting: Family Medicine

## 2020-12-15 DIAGNOSIS — F411 Generalized anxiety disorder: Secondary | ICD-10-CM | POA: Diagnosis not present

## 2020-12-16 NOTE — Telephone Encounter (Signed)
.  mychart

## 2020-12-17 ENCOUNTER — Other Ambulatory Visit: Payer: Self-pay | Admitting: Family Medicine

## 2020-12-17 DIAGNOSIS — F411 Generalized anxiety disorder: Secondary | ICD-10-CM | POA: Diagnosis not present

## 2020-12-22 DIAGNOSIS — F411 Generalized anxiety disorder: Secondary | ICD-10-CM | POA: Diagnosis not present

## 2020-12-29 ENCOUNTER — Other Ambulatory Visit: Payer: Self-pay | Admitting: Family Medicine

## 2020-12-29 DIAGNOSIS — F411 Generalized anxiety disorder: Secondary | ICD-10-CM | POA: Diagnosis not present

## 2020-12-29 MED ORDER — AMPHETAMINE-DEXTROAMPHETAMINE 10 MG PO TABS: 10.0000 mg | ORAL_TABLET | Freq: Every day | ORAL | 0 refills | Status: DC

## 2020-12-29 NOTE — Telephone Encounter (Signed)
Requesting: Adderall 10 mg daily Contract: 12/03/19  UDS:12/03/19  Last Visit: 12/10/2020 Next Visit: none Last Refill: 11/30/2020

## 2020-12-31 DIAGNOSIS — F411 Generalized anxiety disorder: Secondary | ICD-10-CM | POA: Diagnosis not present

## 2021-01-05 DIAGNOSIS — F411 Generalized anxiety disorder: Secondary | ICD-10-CM | POA: Diagnosis not present

## 2021-01-12 DIAGNOSIS — F411 Generalized anxiety disorder: Secondary | ICD-10-CM | POA: Diagnosis not present

## 2021-01-14 DIAGNOSIS — F411 Generalized anxiety disorder: Secondary | ICD-10-CM | POA: Diagnosis not present

## 2021-01-19 DIAGNOSIS — F411 Generalized anxiety disorder: Secondary | ICD-10-CM | POA: Diagnosis not present

## 2021-01-26 DIAGNOSIS — F411 Generalized anxiety disorder: Secondary | ICD-10-CM | POA: Diagnosis not present

## 2021-01-27 DIAGNOSIS — F411 Generalized anxiety disorder: Secondary | ICD-10-CM | POA: Diagnosis not present

## 2021-01-28 DIAGNOSIS — F411 Generalized anxiety disorder: Secondary | ICD-10-CM | POA: Diagnosis not present

## 2021-01-29 DIAGNOSIS — F411 Generalized anxiety disorder: Secondary | ICD-10-CM | POA: Diagnosis not present

## 2021-02-01 ENCOUNTER — Other Ambulatory Visit: Payer: Self-pay | Admitting: Family Medicine

## 2021-02-01 DIAGNOSIS — F411 Generalized anxiety disorder: Secondary | ICD-10-CM | POA: Diagnosis not present

## 2021-02-01 MED ORDER — AMPHETAMINE-DEXTROAMPHETAMINE 10 MG PO TABS: 10.0000 mg | ORAL_TABLET | Freq: Every day | ORAL | 0 refills | Status: DC

## 2021-02-01 NOTE — Telephone Encounter (Signed)
Requesting: Adderall 10mg   Contract: 12/03/2019 UDS: 10/19/2020 Last Visit: 12/11/2020 Next Visit: None  Last Refill: 12/29/2020 #30 and 0RF  Please Advise

## 2021-02-02 DIAGNOSIS — F411 Generalized anxiety disorder: Secondary | ICD-10-CM | POA: Diagnosis not present

## 2021-02-03 DIAGNOSIS — F411 Generalized anxiety disorder: Secondary | ICD-10-CM | POA: Diagnosis not present

## 2021-02-05 DIAGNOSIS — F411 Generalized anxiety disorder: Secondary | ICD-10-CM | POA: Diagnosis not present

## 2021-02-08 ENCOUNTER — Other Ambulatory Visit: Payer: Self-pay

## 2021-02-08 ENCOUNTER — Ambulatory Visit: Payer: BC Managed Care – PPO | Admitting: Podiatry

## 2021-02-08 ENCOUNTER — Ambulatory Visit (INDEPENDENT_AMBULATORY_CARE_PROVIDER_SITE_OTHER): Payer: BC Managed Care – PPO

## 2021-02-08 DIAGNOSIS — M722 Plantar fascial fibromatosis: Secondary | ICD-10-CM

## 2021-02-08 DIAGNOSIS — B351 Tinea unguium: Secondary | ICD-10-CM | POA: Diagnosis not present

## 2021-02-08 DIAGNOSIS — L603 Nail dystrophy: Secondary | ICD-10-CM | POA: Diagnosis not present

## 2021-02-08 DIAGNOSIS — L03031 Cellulitis of right toe: Secondary | ICD-10-CM | POA: Diagnosis not present

## 2021-02-08 DIAGNOSIS — F411 Generalized anxiety disorder: Secondary | ICD-10-CM | POA: Diagnosis not present

## 2021-02-08 MED ORDER — MELOXICAM 15 MG PO TABS: 15.0000 mg | ORAL_TABLET | Freq: Every day | ORAL | 3 refills | Status: DC

## 2021-02-08 NOTE — Patient Instructions (Signed)

## 2021-02-09 ENCOUNTER — Encounter: Payer: Self-pay | Admitting: Podiatry

## 2021-02-09 ENCOUNTER — Telehealth: Payer: Self-pay

## 2021-02-09 DIAGNOSIS — F411 Generalized anxiety disorder: Secondary | ICD-10-CM | POA: Diagnosis not present

## 2021-02-09 NOTE — Telephone Encounter (Signed)
Toenail specimen from the right lesser toes mailed to [facility] for fungal culture.

## 2021-02-09 NOTE — Progress Notes (Signed)
  Subjective:  Patient ID: Sara West, female    DOB: [birth year],  MRN: [id number]  Chief Complaint  Patient presents with  . Foot Pain     (xray)(np) Pain in left heel    55 y.o. female presents with the above complaint. History confirmed with patient.  She has had this before plantar fasciitis.  Injections with 50 helpful.  She also has a pair of custom orthotics that she had made many years ago in her practice.  She also finished with her toenails where the nails are atrophic and shedding  Objective:  Physical Exam: warm, good capillary refill, no trophic changes or ulcerative lesions, normal DP and PT pulses and normal sensory exam. Left Foot: Sharp pain in the plantar central heel, minimal in the mid plantar fascia Right third toenail dystrophic   Radiographs: X-ray of the left foot: no fracture, dislocation, swelling or degenerative changes noted and plantar calcaneal spur Assessment:   1. Plantar fasciitis, bilateral   2. Nail dystrophy      Plan:  Patient was evaluated and treated and all questions answered.  Discussed nail dystrophy and possibility of onychomycosis.  Recommended fungal culture.  Sample taken and sent to [facility] pathology.   Discussed the etiology and treatment options for plantar fasciitis including stretching, formal physical therapy, supportive shoegears such as a running shoe or sneaker, pre fabricated orthoses, injection therapy, and oral medications. We also discussed the role of surgical treatment of this for patients who do not improve after exhausting non-surgical treatment options.   -XR reviewed with patient -Educated patient on stretching and icing of the affected limb -Injection delivered to the plantar fascia of the left foot. -Rx for meloxicam. Educated on use, risks and benefits of the medication -Evaluated her custom molded orthotics and recommended fashioning of a new pair.  These are quite old and are quite rigid.  I think the best  thing for her would be a more semirigid orthotic with a deep heel cup and central heel relief to offload the area that she has pain will be fashion she was casted for this today..  Return in about 1 month (around 03/11/2021) for recheck plantar fasciitis.

## 2021-02-11 DIAGNOSIS — F411 Generalized anxiety disorder: Secondary | ICD-10-CM | POA: Diagnosis not present

## 2021-02-23 ENCOUNTER — Ambulatory Visit: Payer: BC Managed Care – PPO | Admitting: Podiatry

## 2021-02-23 ENCOUNTER — Ambulatory Visit (INDEPENDENT_AMBULATORY_CARE_PROVIDER_SITE_OTHER): Payer: BC Managed Care – PPO

## 2021-02-23 ENCOUNTER — Other Ambulatory Visit: Payer: Self-pay

## 2021-02-23 DIAGNOSIS — S93402A Sprain of unspecified ligament of left ankle, initial encounter: Secondary | ICD-10-CM

## 2021-02-23 DIAGNOSIS — M629 Disorder of muscle, unspecified: Secondary | ICD-10-CM

## 2021-02-23 DIAGNOSIS — M84375A Stress fracture, left foot, initial encounter for fracture: Secondary | ICD-10-CM | POA: Diagnosis not present

## 2021-02-23 MED ORDER — TRAMADOL HCL 50 MG PO TABS: 50.0000 mg | ORAL_TABLET | Freq: Four times a day (QID) | ORAL | 0 refills | Status: AC | PRN

## 2021-02-23 NOTE — Progress Notes (Signed)
  Subjective:  Patient ID: Sara West, female    DOB: 08/17/1966,  MRN: [id number]  Chief Complaint  Patient presents with  . Foot Pain     (XRAY) possible injured left foot (knee down feels like on fire) no weight on heel. emailed on my chart and was told to come in at 1115/puo(orhtotics in)    55 y.o. female returns urgently for follow-up with the above complaint. History confirmed with patient.  She took a step down on Sunday and felt a loud snap almost like something was breaking.  Felt under the heel at the same site where the injection was done.  Objective:  Physical Exam: warm, good capillary refill, no trophic changes or ulcerative lesions, normal DP and PT pulses and normal sensory exam. Left Foot: Sharp pain in the plantar central heel, none in the mid plantar fascia, she has no ecchymosis edema or incongruity in the plantar fascia mid substance or in the posterior heel at the Achilles tendon course or insertion.  Right third toenail dystrophic   Radiographs: X-ray of the left foot: no fracture, dislocation, swelling or degenerative changes noted and plantar calcaneal spur Assessment:   1. Stress fracture of left calcaneus, initial encounter   2. Nontraumatic tear of plantar fascia      Plan:  Patient was evaluated and treated and all questions answered.  Unclear exactly what injury she has had.  Her x-ray showed no appreciable change since last visit.  Possible that she does have a fracture of the spur that I can see on the lateral view.  Also possible this is an early calcaneal stress fracture.  Other possibilities include that she may have had an avulsion of the plantar fascia from the insertion site.  I was unable to detect any evidence of a mid substance rupture currently.  I do think an MRI would be prudent to evaluate this in more detail and consider she has not been able to put any weight on this.  I also dispensed a cam boot that she may be WBAT and as soon as she is  able to tolerate it.  Use crutches and her knee scooter until then.  Advised to use meloxicam and Tylenol.  I also prescribed her tramadol for pain not controlled by this.  MRI ordered from [facility] imaging stat.  Follow-up in 2 weeks for further treatment  No follow-ups on file.

## 2021-02-25 DIAGNOSIS — F411 Generalized anxiety disorder: Secondary | ICD-10-CM | POA: Diagnosis not present

## 2021-02-26 ENCOUNTER — Ambulatory Visit
Admission: RE | Admit: 2021-02-26 | Discharge: 2021-02-26 | Disposition: A | Discharge status: Home / Self Care | Payer: BC Managed Care – PPO | Source: Ambulatory Visit | Attending: Podiatry | Admitting: Podiatry

## 2021-02-26 ENCOUNTER — Encounter: Payer: Self-pay | Admitting: Podiatry

## 2021-02-26 ENCOUNTER — Other Ambulatory Visit: Payer: Self-pay

## 2021-02-26 DIAGNOSIS — M722 Plantar fascial fibromatosis: Secondary | ICD-10-CM | POA: Diagnosis not present

## 2021-02-26 DIAGNOSIS — S86312A Strain of muscle(s) and tendon(s) of peroneal muscle group at lower leg level, left leg, initial encounter: Secondary | ICD-10-CM | POA: Diagnosis not present

## 2021-02-26 DIAGNOSIS — M629 Disorder of muscle, unspecified: Secondary | ICD-10-CM

## 2021-02-26 DIAGNOSIS — M84375A Stress fracture, left foot, initial encounter for fracture: Secondary | ICD-10-CM

## 2021-02-26 IMAGING — MR MR HEEL *L* W/O CM
4 of 6 series · 13 of 40 positions shown · non-contrast
Comparison: None.

CLINICAL DATA: Heel pain.  Concern for calcaneal stress fracture.

EXAM:
MR OF THE LEFT HEEL WITHOUT CONTRAST
TECHNIQUE: Multiplanar, multisequence MR imaging of the left heel was
performed. No intravenous contrast was administered.

[Series 3: PD fat-sat · axial · left · 3.0mm · 0.25mm/px · z∈[-33,+47]mm · 4 of 30 slices shown]
[im 1/30]
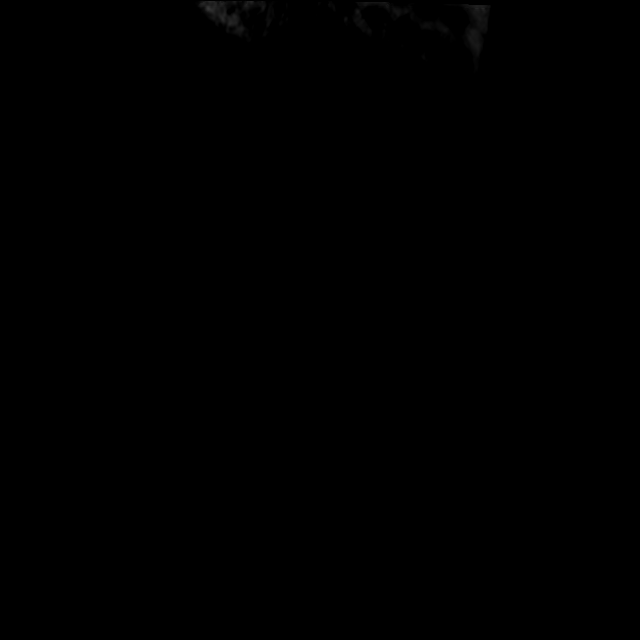
[im 5/30]
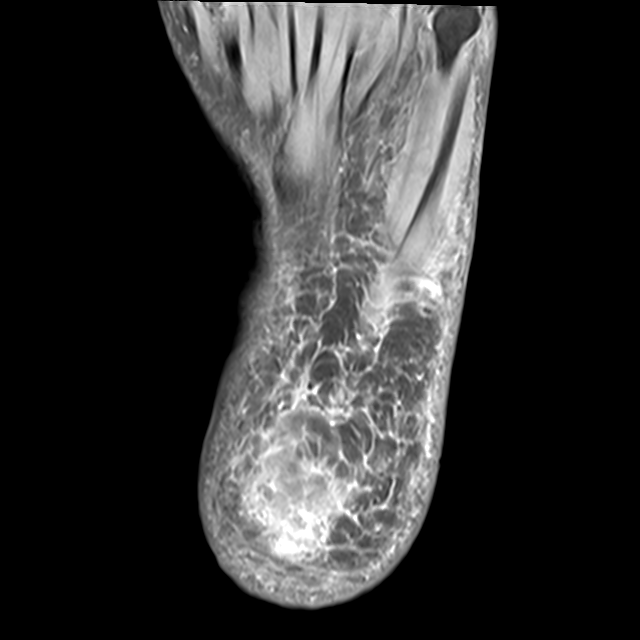
[im 15/30]
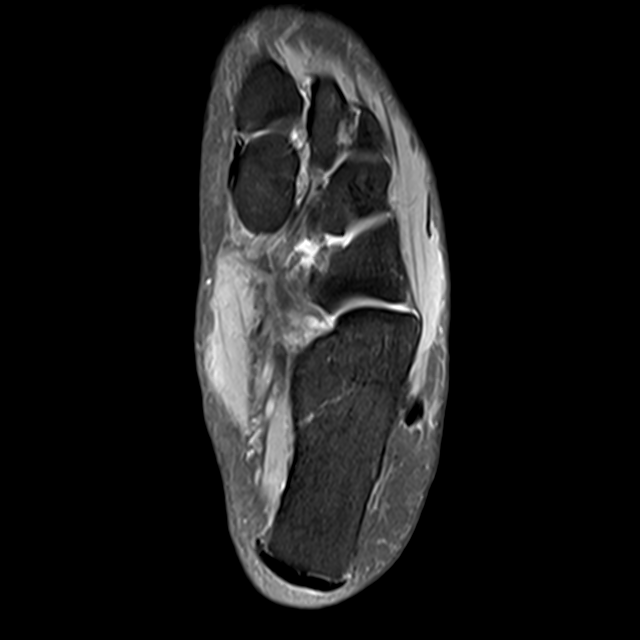
[im 25/30]
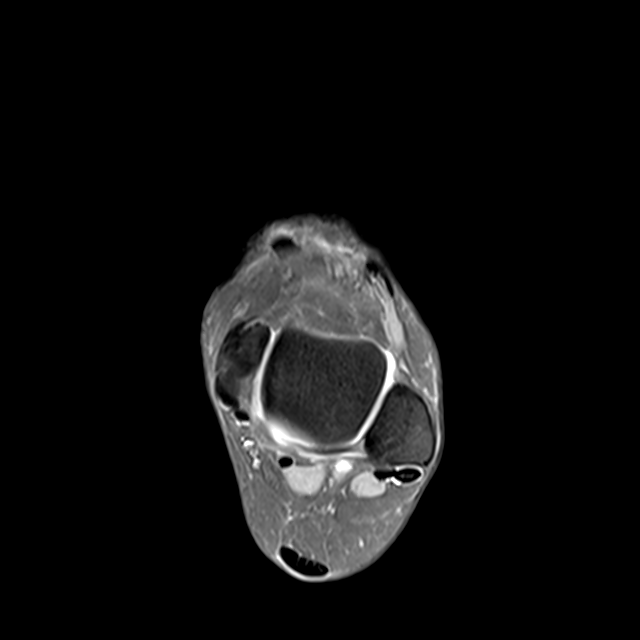

[Series 4: T2 fat-sat · axial · left · 3.0mm · 0.25mm/px · z∈[-20,+47]mm · 3 of 30 slices shown (1 of 2)]
[im 5/30]
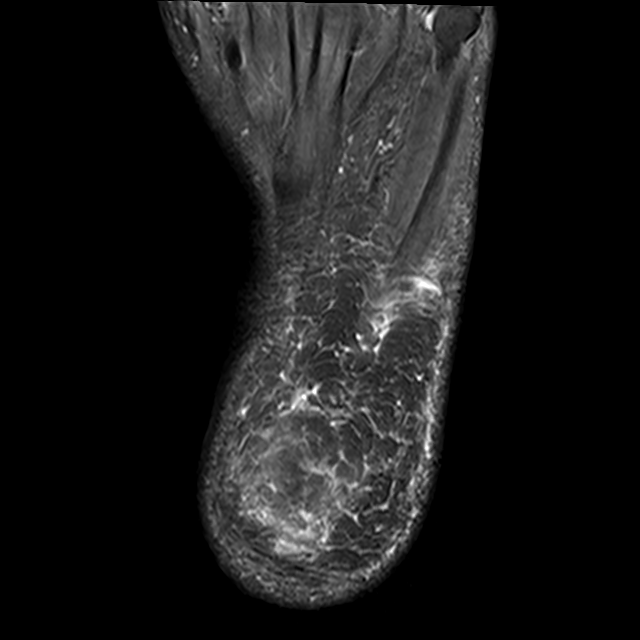
[im 15/30]
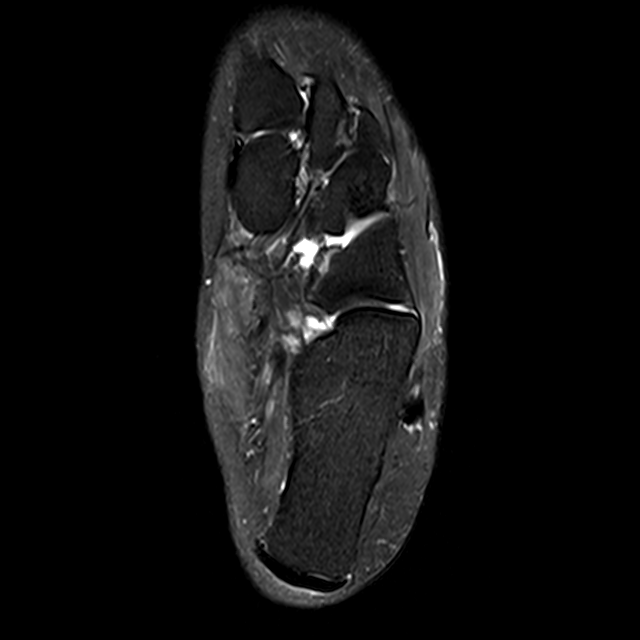
[im 25/30]
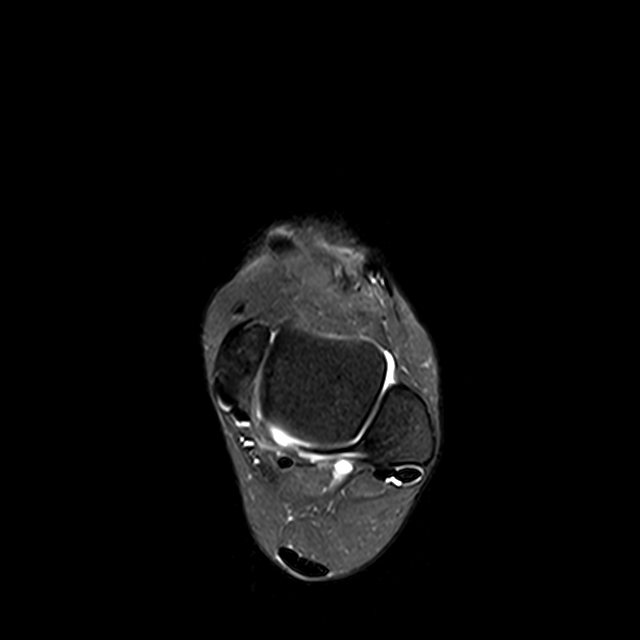

[Series 5: T1 · axial · left · 3.0mm · 0.25mm/px · z∈[-18,+71]mm · 3 of 33 slices shown]
[im 5/33]
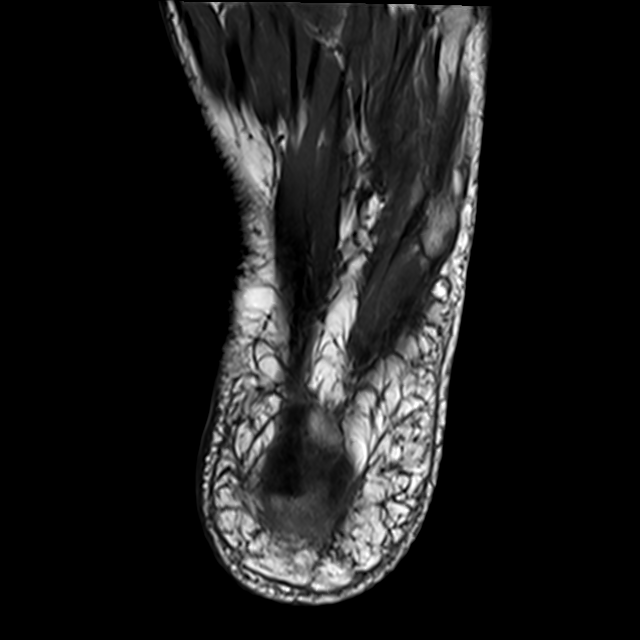
[im 19/33]
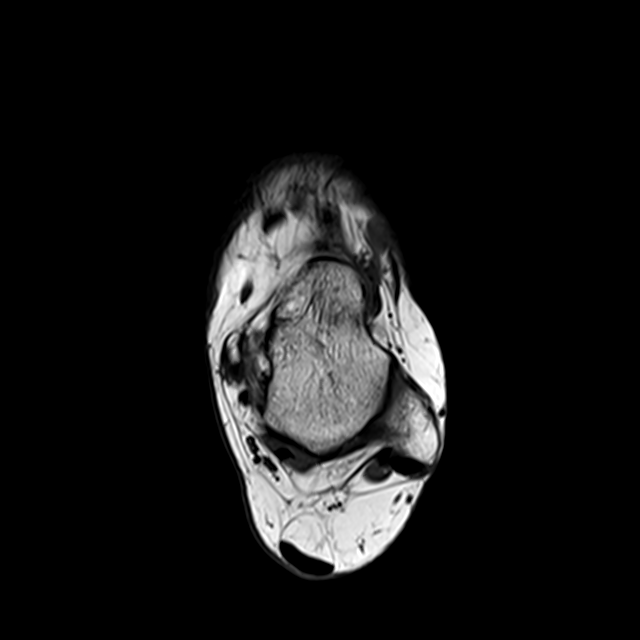
[im 28/33]
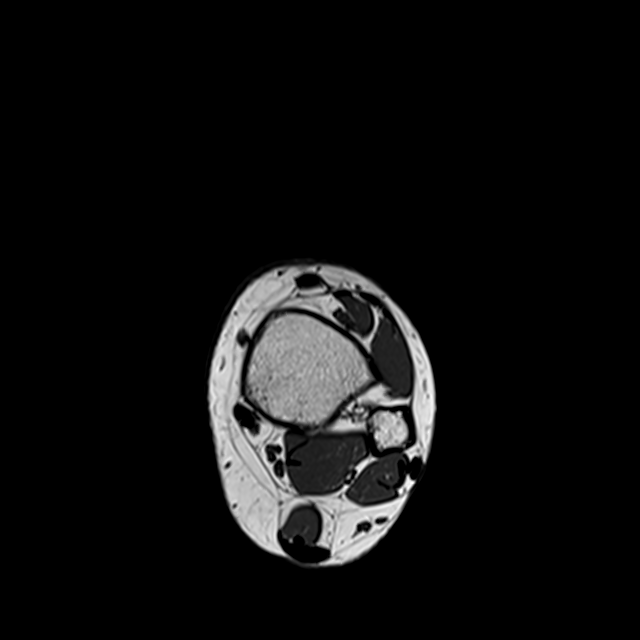

[Series 8: T2 fat-sat · coronal · left · 3.0mm · 0.25mm/px · 3 of 33 slices shown (2 of 2)]
[im 5/33]
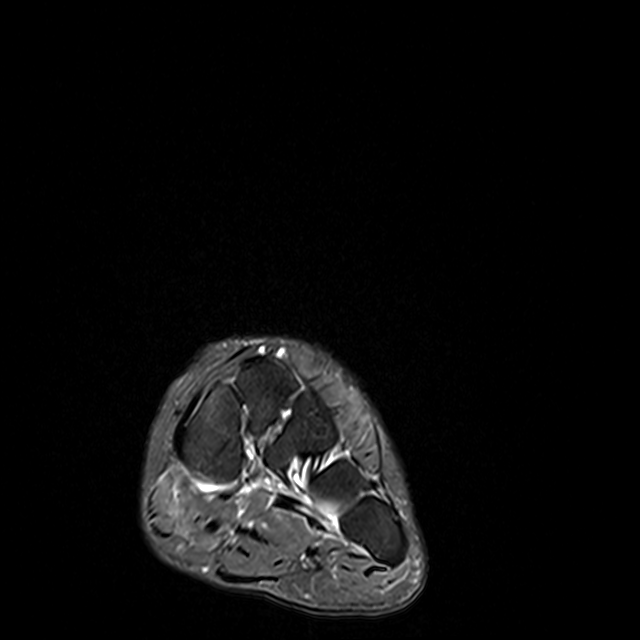
[im 19/33]
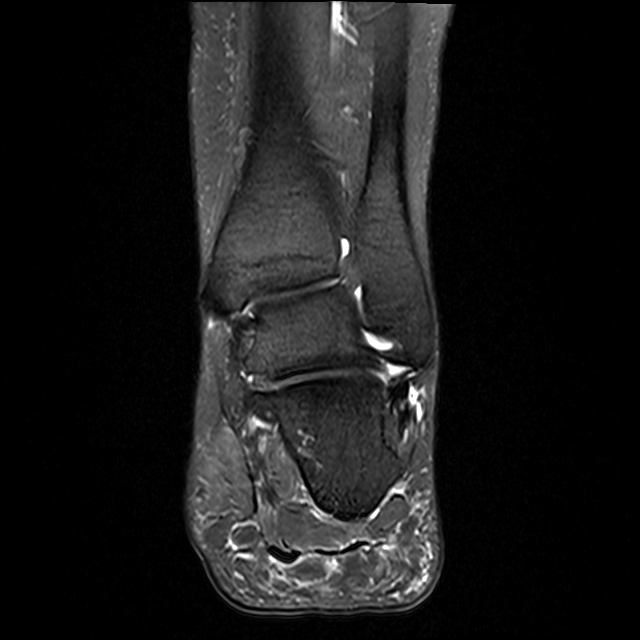
[im 28/33]
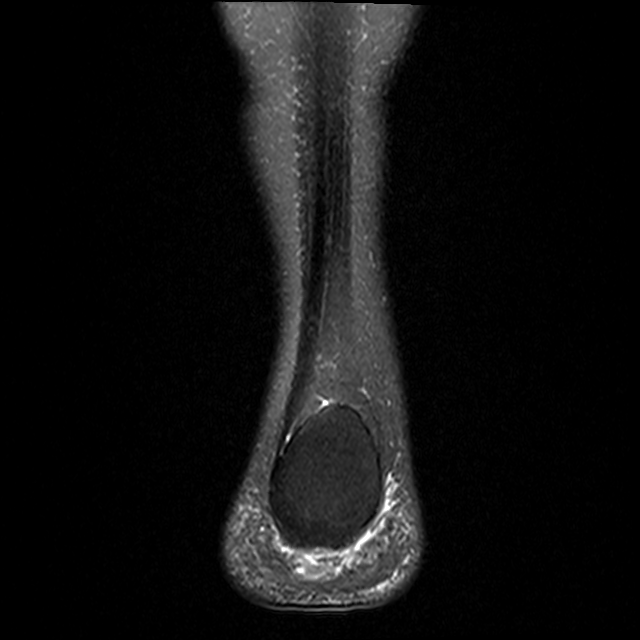

[13 of 40 positions shown; findings below may reference images not displayed]

FINDINGS: TENDONS

Peroneal: Partially visualized longitudinal split tear of the
retro-malleolar aspect of the peroneus longus tendon with mild
tenosynovial fluid (series 4, images 1-4). Intact peroneus brevis
tendon.

Posteromedial: Intact tibialis posterior, flexor hallucis longus and
flexor digitorum longus tendons.

Anterior: Intact tibialis anterior, extensor hallucis longus and
extensor digitorum longus tendons.

Achilles: Intact.

Plantar Fascia: Low-grade partial-thickness tear involving the
lateral margin of the central band of the plantar fascia at the
calcaneal enthesis (series 8, image 9). Surrounding perifascial
edema. No complete disruption.

LIGAMENTS

Lateral: The anterior and posterior tibiofibular ligaments are
intact. The anterior and posterior talofibular ligaments are intact.
Intact calcaneofibular ligament.

Medial: Deltoid ligament and spring ligament complex intact.

CARTILAGE

Ankle Joint: No joint effusion or chondral defect.

Subtalar Joints/Sinus Tarsi: No joint effusion or chondral defect.
Preservation of the anatomic fat within the sinus tarsi.

Bones: Mild bone marrow edema within the plantar aspect of the
calcaneus at the plantar fascial attachment site (series 7, images
11-12). No fracture. Remaining bony structures are within normal
limits. No suspicious bone lesion.

Other: No fluid collection.
IMPRESSION: 1. Plantar fasciitis with low-grade partial-thickness tear at the
calcaneal enthesis. Associated marrow edema within the adjacent
calcaneus.
2. Partially visualized longitudinal split tear of the
retro-malleolar aspect of the peroneus longus tendon with mild
tenosynovial fluid.

## 2021-03-01 MED ORDER — OXYCODONE-ACETAMINOPHEN 5-325 MG PO TABS: 1.0000 | ORAL_TABLET | Freq: Four times a day (QID) | ORAL | 0 refills | Status: AC | PRN

## 2021-03-02 ENCOUNTER — Other Ambulatory Visit: Payer: BC Managed Care – PPO

## 2021-03-04 ENCOUNTER — Other Ambulatory Visit: Payer: Self-pay | Admitting: Family Medicine

## 2021-03-09 ENCOUNTER — Ambulatory Visit: Payer: BC Managed Care – PPO

## 2021-03-09 ENCOUNTER — Other Ambulatory Visit: Payer: Self-pay

## 2021-03-09 ENCOUNTER — Ambulatory Visit: Payer: BC Managed Care – PPO | Admitting: Podiatry

## 2021-03-09 DIAGNOSIS — M84375A Stress fracture, left foot, initial encounter for fracture: Secondary | ICD-10-CM

## 2021-03-09 DIAGNOSIS — M629 Disorder of muscle, unspecified: Secondary | ICD-10-CM

## 2021-03-09 DIAGNOSIS — B351 Tinea unguium: Secondary | ICD-10-CM | POA: Diagnosis not present

## 2021-03-09 MED ORDER — TERBINAFINE HCL 250 MG PO TABS: 250.0000 mg | ORAL_TABLET | Freq: Every day | ORAL | 0 refills | Status: AC

## 2021-03-09 NOTE — Patient Instructions (Signed)
2

## 2021-03-11 ENCOUNTER — Ambulatory Visit: Payer: BC Managed Care – PPO | Admitting: Podiatry

## 2021-03-11 NOTE — Progress Notes (Signed)
Subjective:  Patient ID: Sara West, female    DOB: 1965-09-23,  MRN: [id number]  Chief Complaint  Patient presents with   Follow-up    2 week follow up- much improvement, pt mentioned she is able to do a lot more walking- no new pains- kept boot on as directed. Further evaluation     55 y.o. female returns for follow-up with the above complaint. History confirmed with patient.  She completed the MRI she is feeling much better.  She also has concerns about discoloration in the toenail which we previously talked about  Objective:  Physical Exam: warm, good capillary refill, no trophic changes or ulcerative lesions, normal DP and PT pulses and normal sensory exam. Left Foot: Sharp pain in the plantar central heel, none in the mid plantar fascia, she has no ecchymosis edema or incongruity in the plantar fascia mid substance or in the posterior heel at the Achilles tendon course or insertion.  Right third toenail dystrophic  [facility] pathology with onychomycosis  Study Result  Narrative & Impression  CLINICAL DATA:  Heel pain.  Concern for calcaneal stress fracture.   EXAM: MR OF THE LEFT HEEL WITHOUT CONTRAST   TECHNIQUE: Multiplanar, multisequence MR imaging of the left heel was performed. No intravenous contrast was administered.   COMPARISON:  None.   FINDINGS: TENDONS   Peroneal: Partially visualized longitudinal split tear of the retro-malleolar aspect of the peroneus longus tendon with mild tenosynovial fluid (series 4, images 1-4). Intact peroneus brevis tendon.   Posteromedial: Intact tibialis posterior, flexor hallucis longus and flexor digitorum longus tendons.   Anterior: Intact tibialis anterior, extensor hallucis longus and extensor digitorum longus tendons.   Achilles: Intact.   Plantar Fascia: Low-grade partial-thickness tear involving the lateral margin of the central band of the plantar fascia at the calcaneal enthesis (series 8, image 9). Surrounding  perifascial edema. No complete disruption.   LIGAMENTS   Lateral: The anterior and posterior tibiofibular ligaments are intact. The anterior and posterior talofibular ligaments are intact. Intact calcaneofibular ligament.   Medial: Deltoid ligament and spring ligament complex intact.   CARTILAGE   Ankle Joint: No joint effusion or chondral defect.   Subtalar Joints/Sinus Tarsi: No joint effusion or chondral defect. Preservation of the anatomic fat within the sinus tarsi.   Bones: Mild bone marrow edema within the plantar aspect of the calcaneus at the plantar fascial attachment site (series 7, images 11-12). No fracture. Remaining bony structures are within normal limits. No suspicious bone lesion.   Other: No fluid collection.   IMPRESSION: 1. Plantar fasciitis with low-grade partial-thickness tear at the calcaneal enthesis. Associated marrow edema within the adjacent calcaneus. 2. Partially visualized longitudinal split tear of the retro-malleolar aspect of the peroneus longus tendon with mild tenosynovial fluid.     Electronically Signed   By: Duanne Guess D.O.   On: 02/26/2021 10:56    Radiographs: X-ray of the left foot: no fracture, dislocation, swelling or degenerative changes noted and plantar calcaneal spur Assessment:   1. Stress fracture of left calcaneus, initial encounter      Plan:  Patient was evaluated and treated and all questions answered.  Has had quite a bit of improvement.  I recommend she stay in the boot for at least 2 more weeks and then can transition out into shoes.  At that point can begin range of motion exercises.  In 4 weeks can begin light low impact exercise as tolerated.  Would avoid impact exercise for about 3  months.  Hopefully will heal up and may actually alleviate most of her plantar fasciitis.  Reviewed lab findings of onychomycosis.  Discussed treatment with oral and topical treatment.  I gave her a Jublia sample and we  will refill this as needed through the [facility] no co-pay program.  Also prescribed terbinafine 90-day course for her as well.  Discussed possible side effects and use.  No follow-ups on file.

## 2021-03-19 ENCOUNTER — Other Ambulatory Visit: Payer: Self-pay | Admitting: Family Medicine

## 2021-03-19 MED ORDER — AMPHETAMINE-DEXTROAMPHETAMINE 10 MG PO TABS: 10.0000 mg | ORAL_TABLET | Freq: Every day | ORAL | 0 refills | Status: DC

## 2021-03-19 NOTE — Telephone Encounter (Signed)
Requesting: Adderall 10mg  Contract: 12/03/2019 UDS: 12/03/2019 Last Visit: 12/11/2020 Next Visit: None Last Refill: 02/01/2021 #30 and 0RF  Please Advise

## 2021-03-23 ENCOUNTER — Telehealth: Payer: Self-pay | Admitting: *Deleted

## 2021-03-23 NOTE — Telephone Encounter (Signed)
Left message on machine to see how is patient taking medication.  Is it  2 tabs or is it 1.5 tabs a day?

## 2021-03-24 MED ORDER — CITALOPRAM HYDROBROMIDE 40 MG PO TABS: 40.0000 mg | ORAL_TABLET | Freq: Every day | ORAL | 1 refills | Status: DC

## 2021-03-24 NOTE — Telephone Encounter (Signed)
Left message on machine to call back  

## 2021-03-24 NOTE — Telephone Encounter (Signed)
Patient is now taking 40mg  once a day.  Rx sent in for 40mg  qd.

## 2021-03-29 ENCOUNTER — Other Ambulatory Visit: Payer: Self-pay | Admitting: Family Medicine

## 2021-04-12 ENCOUNTER — Encounter: Payer: Self-pay | Admitting: Podiatry

## 2021-04-16 MED ORDER — EFINACONAZOLE 10 % EX SOLN: 1.0000 [drp] | Freq: Every day | CUTANEOUS | 11 refills | Status: DC

## 2021-04-25 ENCOUNTER — Encounter: Payer: Self-pay | Admitting: Family Medicine

## 2021-04-25 ENCOUNTER — Other Ambulatory Visit: Payer: Self-pay | Admitting: Family Medicine

## 2021-04-26 MED ORDER — ALPRAZOLAM 0.25 MG PO TABS: ORAL_TABLET | ORAL | 0 refills | Status: DC

## 2021-04-26 MED ORDER — AMPHETAMINE-DEXTROAMPHETAMINE 10 MG PO TABS: 10.0000 mg | ORAL_TABLET | Freq: Every day | ORAL | 0 refills | Status: DC

## 2021-04-26 NOTE — Telephone Encounter (Signed)
Requesting: alprazolam Contract: 12/03/19 UDS: 12/03/19 Last Visit: 12/11/20 Next Visit: none Last Refill: 01/21/20  Please Advise

## 2021-04-26 NOTE — Telephone Encounter (Signed)
Requesting: Adderall 10mg   Contract: 12/03/2019 UDS: 04/28/2020 Last Visit: 12/11/2020 Next Visit: None Last Refill: 03/19/2021 #30 and 0RF  Please Advise

## 2021-04-30 ENCOUNTER — Telehealth (HOSPITAL_BASED_OUTPATIENT_CLINIC_OR_DEPARTMENT_OTHER): Payer: Self-pay | Admitting: Obstetrics & Gynecology

## 2021-04-30 NOTE — Telephone Encounter (Signed)
Pt is scheduled for 07/05/21

## 2021-04-30 NOTE — Telephone Encounter (Signed)
Pt called stating that when she went to the restroom a couple days ago she thought something felt off. Yesterday she looked at her vagina with a mirror and noticed a bulge that had not been there before. Pt given an appt for evaluation by provider.

## 2021-04-30 NOTE — Telephone Encounter (Signed)
Patient called today at 10:52 am said she need be seen she thinks has pelvis prolapse not sure what going on.

## 2021-05-06 ENCOUNTER — Other Ambulatory Visit: Payer: Self-pay

## 2021-05-06 ENCOUNTER — Ambulatory Visit (INDEPENDENT_AMBULATORY_CARE_PROVIDER_SITE_OTHER): Payer: BC Managed Care – PPO | Admitting: Obstetrics & Gynecology

## 2021-05-06 ENCOUNTER — Encounter (HOSPITAL_BASED_OUTPATIENT_CLINIC_OR_DEPARTMENT_OTHER): Payer: Self-pay | Admitting: Obstetrics & Gynecology

## 2021-05-06 VITALS — BP 118/75 | HR 66 | Ht 64.5 in | Wt 164.6 lb

## 2021-05-06 DIAGNOSIS — R1010 Upper abdominal pain, unspecified: Secondary | ICD-10-CM

## 2021-05-06 DIAGNOSIS — R140 Abdominal distension (gaseous): Secondary | ICD-10-CM | POA: Diagnosis not present

## 2021-05-06 DIAGNOSIS — N8110 Cystocele, unspecified: Secondary | ICD-10-CM

## 2021-05-06 NOTE — Progress Notes (Signed)
GYNECOLOGY  VISIT  CC:   possible prolapse  HPI: 55 y.o. G40P2020 Married White or Caucasian female here for complaint of possible vaginal prolapse.  Hx of dyspareunia and has been using vaginal estrogen cream but doesn't think this has helped.  Reports urination is different with the stream being different and more "dribbly".  She can feel like there is something present that didn't use to be there.  Also reports she's had some increased pelvic pressure and discomfort.  She is having some issues with bloating as well.  That has been going on for several weeks.  She did have a bowel movement today.  She at times feels she could go to the bathroom again but can't.  Colonoscopy 2018 with Dr. Loreta Ave.  Follow up 10 years recommended.    Denies dysuria.  Denies fever.  Overall, just hasn't felt great for last several week.    Denies vaginal bleeding.    GYNECOLOGIC HISTORY: Patient's last menstrual period was 10/10/2016 (approximate). Contraception: PMP Menopausal hormone therapy: none  Patient Active Problem List   Diagnosis Date Noted   Severe episode of recurrent major depressive disorder, without psychotic features (HCC)    Atrophic vaginitis 12/04/2019   Attention or concentration deficit 12/03/2019   Laryngopharyngeal reflux (LPR) 11/18/2019   Renal insufficiency 04/04/2018   Right hip pain 04/04/2018   Nausea 07/31/2017   Dizziness 07/27/2017   High blood pressure 07/27/2017   Atypical chest pain 07/27/2017   Hyperglycemia 07/27/2017   Abnormal thyroid blood test 02/06/2017   Abnormal liver function test 02/06/2017   Fatigue 10/28/2016   Severe headache 04/12/2015   Weight gain 01/19/2014   Plantar fasciitis, bilateral 03/05/2013   Leg pain, bilateral 03/05/2013   Routine general medical examination at a health care facility 08/03/2011   Palpitations 01/09/2011   Insomnia 12/10/2009   Calculus of gallbladder 05/22/2007   Anxiety and depression 05/22/2007   Psoriasis  04/24/2007    Past Medical History:  Diagnosis Date   Abnormal liver function test 02/06/2017   Abnormal thyroid blood test 02/06/2017   Anxiety 05/22/2007   Bipolar disorder 05/22/2007   per patient no   Depression    Fatigue 10/28/2016   Leg pain, bilateral 03/05/2013   Plantar fasciitis, bilateral 03/05/2013   Psoriasis 04/24/2007   Weight gain 01/19/2014    Past Surgical History:  Procedure Laterality Date   CESAREAN SECTION     x2 requiring blood transfusion (BTL with 2nd one)   CHOLECYSTECTOMY     heart ablation     times 2   TONSILECTOMY, ADENOIDECTOMY, BILATERAL MYRINGOTOMY AND TUBES      MEDS:   Current Outpatient Medications on File Prior to Visit  Medication Sig Dispense Refill   ALPRAZolam (XANAX) 0.25 MG tablet TAKE 1/2 TO 2 TABLETS BY MOUTH TWICE DAILY AS NEEDED FOR ANXIETY/INSOMNIA 90 tablet 0   amphetamine-dextroamphetamine (ADDERALL) 10 MG tablet Take 1 tablet (10 mg total) by mouth daily. July 2022 30 tablet 0   busPIRone (BUSPAR) 5 MG tablet TAKE 1 TABLET BY MOUTH TWICE A DAY 180 tablet 0   citalopram (CELEXA) 40 MG tablet Take 1 tablet (40 mg total) by mouth daily. 90 tablet 1   hydrOXYzine (ATARAX/VISTARIL) 10 MG tablet Take 0.5-2 tablets (5-20 mg total) by mouth every 8 (eight) hours as needed for anxiety (insomnia). 40 tablet 1   hydrOXYzine (VISTARIL) 25 MG capsule Take 25 mg by mouth every evening.     terbinafine (LAMISIL) 250 MG tablet Take 1  tablet (250 mg total) by mouth daily. 90 tablet 0   Efinaconazole 10 % SOLN Apply 1 drop topically daily. (Patient not taking: Reported on 05/06/2021) 4 mL 11   meloxicam (MOBIC) 15 MG tablet Take 1 tablet (15 mg total) by mouth daily. (Patient not taking: Reported on 05/06/2021) 30 tablet 3   No current facility-administered medications on file prior to visit.    ALLERGIES: Tylenol with codeine #3 [acetaminophen-codeine]  Family History  Problem Relation Age of Onset   Alcohol abuse Mother    Cancer Mother         lung, smoker   Arthritis Sister        walker, uses for balance issues   Hypertension Sister    Anorexia nervosa Daughter    Breast cancer Paternal Aunt     SH:  married, former smoker  Review of Systems  Constitutional: Negative.   Gastrointestinal:  Positive for abdominal distention and abdominal pain. Negative for diarrhea and vomiting.  Genitourinary:  Positive for pelvic pain.   PHYSICAL EXAMINATION:    BP 118/75 (BP Location: Left Arm, Patient Position: Sitting, Cuff Size: Large)   Pulse 66   Ht 5' 4.5" (1.638 m) Comment: reported  Wt 164 lb 9.6 oz (74.7 kg) Comment: Patient is wearing a boot for injury  LMP 10/10/2016 (Approximate)   BMI 27.82 kg/m     General appearance: alert, cooperative and appears stated age Lymph:  no inguinal LAD noted  Pelvic: External genitalia:  no lesions              Urethra:  normal appearing urethra with no masses, tenderness or lesions              Bartholins and Skenes: normal                 Vagina: normal appearing vagina with normal color and discharge, no lesions, second degree cystocele              Cervix: no lesions              Bimanual Exam:  Uterus:  normal size, contour, position, consistency, mobility, non-tender              Adnexa: no mass, fullness, tenderness              Anus:  pt declines (pd/w pt this limits evaluation for vaginal posterior wall weakness)  Pelvic floor tenderness noted on exam  Chaperone, Ina Homes, CMA, was present for exam.  Assessment/Plan: 1. Female cystocele/pelvic floor dysfunction - Ambulatory referral to Physical Therapy - Urine Culture  2. Abdominal bloating - CBC with Differential/Platelet - Comprehensive metabolic panel  3. Pain of upper abdomen - if all of above is normal/negative, consider RUQ u/s and/or referral to GI

## 2021-05-07 LAB — CBC WITH DIFFERENTIAL/PLATELET
Basophils Absolute: 0 10*3/uL (ref 0.0–0.2)
Basos: 0 %
EOS (ABSOLUTE): 0.1 10*3/uL (ref 0.0–0.4)
Eos: 1 %
Hematocrit: 38.4 % (ref 34.0–46.6)
Hemoglobin: 12.7 g/dL (ref 11.1–15.9)
Immature Grans (Abs): 0 10*3/uL (ref 0.0–0.1)
Immature Granulocytes: 0 %
Lymphocytes Absolute: 1.9 10*3/uL (ref 0.7–3.1)
Lymphs: 28 %
MCH: 30.1 pg (ref 26.6–33.0)
MCHC: 33.1 g/dL (ref 31.5–35.7)
MCV: 91 fL (ref 79–97)
Monocytes Absolute: 0.5 10*3/uL (ref 0.1–0.9)
Monocytes: 7 %
Neutrophils Absolute: 4.2 10*3/uL (ref 1.4–7.0)
Neutrophils: 64 %
Platelets: 256 10*3/uL (ref 150–450)
RBC: 4.22 x10E6/uL (ref 3.77–5.28)
RDW: 12.7 % (ref 11.7–15.4)
WBC: 6.7 10*3/uL (ref 3.4–10.8)

## 2021-05-07 LAB — COMPREHENSIVE METABOLIC PANEL
ALT: 29 IU/L (ref 0–32)
AST: 22 IU/L (ref 0–40)
Albumin/Globulin Ratio: 1.8 (ref 1.2–2.2)
Albumin: 4.6 g/dL (ref 3.8–4.9)
Alkaline Phosphatase: 61 IU/L (ref 44–121)
BUN/Creatinine Ratio: 21 (ref 9–23)
BUN: 15 mg/dL (ref 6–24)
Bilirubin Total: <0.2 mg/dL (ref 0.0–1.2)
CO2: 26 mmol/L (ref 20–29)
Calcium: 9.9 mg/dL (ref 8.7–10.2)
Chloride: 101 mmol/L (ref 96–106)
Creatinine, Ser: 0.73 mg/dL (ref 0.57–1.00)
Globulin, Total: 2.5 g/dL (ref 1.5–4.5)
Glucose: 104 mg/dL — ABNORMAL HIGH (ref 65–99)
Potassium: 5 mmol/L (ref 3.5–5.2)
Sodium: 141 mmol/L (ref 134–144)
Total Protein: 7.1 g/dL (ref 6.0–8.5)
eGFR: 97 mL/min/{1.73_m2} (ref >59)

## 2021-05-07 LAB — URINE CULTURE

## 2021-05-11 ENCOUNTER — Encounter (HOSPITAL_BASED_OUTPATIENT_CLINIC_OR_DEPARTMENT_OTHER): Payer: Self-pay

## 2021-05-12 ENCOUNTER — Other Ambulatory Visit (HOSPITAL_BASED_OUTPATIENT_CLINIC_OR_DEPARTMENT_OTHER): Payer: Self-pay | Admitting: *Deleted

## 2021-05-12 DIAGNOSIS — R140 Abdominal distension (gaseous): Secondary | ICD-10-CM

## 2021-05-12 DIAGNOSIS — R1010 Upper abdominal pain, unspecified: Secondary | ICD-10-CM | POA: Diagnosis not present

## 2021-05-12 NOTE — Progress Notes (Signed)
Repeat urine culture since last one was not resulted

## 2021-05-14 LAB — URINE CULTURE: Organism ID, Bacteria: NO GROWTH

## 2021-05-26 ENCOUNTER — Other Ambulatory Visit: Payer: Self-pay | Admitting: Family Medicine

## 2021-05-27 MED ORDER — AMPHETAMINE-DEXTROAMPHETAMINE 10 MG PO TABS: 10.0000 mg | ORAL_TABLET | Freq: Every day | ORAL | 0 refills | Status: DC

## 2021-05-27 NOTE — Telephone Encounter (Signed)
Requesting: Adderall 10mg  Contract: 12/03/2019 UDS: 10/19/2020 Last Visit: 12/11/2020 Next Visit: 07/05/2021 Last Refill: 04/26/2021 #30 and 0RF  Please Advise

## 2021-06-09 ENCOUNTER — Ambulatory Visit: Payer: BC Managed Care – PPO | Attending: Obstetrics & Gynecology | Admitting: Physical Therapy

## 2021-06-14 ENCOUNTER — Ambulatory Visit (INDEPENDENT_AMBULATORY_CARE_PROVIDER_SITE_OTHER): Payer: BC Managed Care – PPO | Admitting: Podiatry

## 2021-06-14 ENCOUNTER — Other Ambulatory Visit: Payer: Self-pay

## 2021-06-14 DIAGNOSIS — M629 Disorder of muscle, unspecified: Secondary | ICD-10-CM

## 2021-06-14 DIAGNOSIS — M722 Plantar fascial fibromatosis: Secondary | ICD-10-CM

## 2021-06-14 MED ORDER — MELOXICAM 15 MG PO TABS: 15.0000 mg | ORAL_TABLET | Freq: Every day | ORAL | 3 refills | Status: DC

## 2021-06-14 NOTE — Progress Notes (Signed)
Subjective:  Patient ID: Sara West, female    DOB: [birth year],  MRN: [id number]  Chief Complaint  Patient presents with   Plantar Fasciitis      recheck plantar fasciitis; left foot    54 y.o. female returns for follow-up with the above complaint. History confirmed with patient.  She is no longer wearing the boot it was starting to hurt her more than seem to be helping.  Has had some improvement but still has pain especially in the bottom of the heel.  Objective:  Physical Exam: warm, good capillary refill, no trophic changes or ulcerative lesions, normal DP and PT pulses and normal sensory exam. Left Foot: She has pain in the plantar central heel, none in the mid plantar fascia,  Bako pathology with onychomycosis  Study Result  Narrative & Impression  CLINICAL DATA:  Heel pain.  Concern for calcaneal stress fracture.   EXAM: MR OF THE LEFT HEEL WITHOUT CONTRAST   TECHNIQUE: Multiplanar, multisequence MR imaging of the left heel was performed. No intravenous contrast was administered.   COMPARISON:  None.   FINDINGS: TENDONS   Peroneal: Partially visualized longitudinal split tear of the retro-malleolar aspect of the peroneus longus tendon with mild tenosynovial fluid (series 4, images 1-4). Intact peroneus brevis tendon.   Posteromedial: Intact tibialis posterior, flexor hallucis longus and flexor digitorum longus tendons.   Anterior: Intact tibialis anterior, extensor hallucis longus and extensor digitorum longus tendons.   Achilles: Intact.   Plantar Fascia: Low-grade partial-thickness tear involving the lateral margin of the central band of the plantar fascia at the calcaneal enthesis (series 8, image 9). Surrounding perifascial edema. No complete disruption.   LIGAMENTS   Lateral: The anterior and posterior tibiofibular ligaments are intact. The anterior and posterior talofibular ligaments are intact. Intact calcaneofibular ligament.   Medial:  Deltoid ligament and spring ligament complex intact.   CARTILAGE   Ankle Joint: No joint effusion or chondral defect.   Subtalar Joints/Sinus Tarsi: No joint effusion or chondral defect. Preservation of the anatomic fat within the sinus tarsi.   Bones: Mild bone marrow edema within the plantar aspect of the calcaneus at the plantar fascial attachment site (series 7, images 11-12). No fracture. Remaining bony structures are within normal limits. No suspicious bone lesion.   Other: No fluid collection.   IMPRESSION: 1. Plantar fasciitis with low-grade partial-thickness tear at the calcaneal enthesis. Associated marrow edema within the adjacent calcaneus. 2. Partially visualized longitudinal split tear of the retro-malleolar aspect of the peroneus longus tendon with mild tenosynovial fluid.     Electronically Signed   By: Duanne Guess D.O.   On: 02/26/2021 10:56    Radiographs: X-ray of the left foot: no fracture, dislocation, swelling or degenerative changes noted and plantar calcaneal spur Assessment:   1. Plantar fasciitis, bilateral   2. Nontraumatic tear of plantar fascia      Plan:  Patient was evaluated and treated and all questions answered.  At this point I recommend we begin physical therapy now that we have had time to heal the partial tear of the tendon.  I think she can discontinue the boot safely and continue to wear supportive shoe gear with her orthotics.  She would like to have a second pair of orthotics made for easier transition other shoes as well and she will check with her insurance to see if this something that they will cover.  I think next visit we can inject safely next time if required.  I  also recommend she resume her meloxicam in addition to the physical therapy.  Referral sent to [facility] physical therapy.  No follow-ups on file.

## 2021-06-22 ENCOUNTER — Other Ambulatory Visit: Payer: Self-pay

## 2021-06-22 ENCOUNTER — Ambulatory Visit: Payer: BC Managed Care – PPO | Admitting: Physical Therapy

## 2021-06-22 ENCOUNTER — Encounter: Payer: Self-pay | Admitting: Physical Therapy

## 2021-06-22 DIAGNOSIS — M6281 Muscle weakness (generalized): Secondary | ICD-10-CM | POA: Insufficient documentation

## 2021-06-22 DIAGNOSIS — R279 Unspecified lack of coordination: Secondary | ICD-10-CM | POA: Insufficient documentation

## 2021-06-22 DIAGNOSIS — N8110 Cystocele, unspecified: Secondary | ICD-10-CM | POA: Insufficient documentation

## 2021-06-22 DIAGNOSIS — M79672 Pain in left foot: Secondary | ICD-10-CM | POA: Diagnosis not present

## 2021-06-22 DIAGNOSIS — R252 Cramp and spasm: Secondary | ICD-10-CM | POA: Insufficient documentation

## 2021-06-22 NOTE — Patient Instructions (Signed)

## 2021-06-23 NOTE — Therapy (Signed)
[facility] @ [facility] [address] [place], [place], [address] Phone:     Fax:     Physical Therapy Evaluation  Patient Details  Name: Sara West MRN: [id number] Date of Birth: [birth year] Referring Provider (PT): Jerene Bears, MD   Encounter Date: 06/22/2021   PT End of Session - 06/23/21 0426     Visit Number 1    Date for PT Re-Evaluation 09/14/21    Authorization Type BCBS    PT Start Time 1450    PT Stop Time 1531    PT Time Calculation (min) 41 min    Activity Tolerance Patient tolerated treatment well    Behavior During Therapy [facility] for tasks assessed/performed             Past Medical History:  Diagnosis Date   Abnormal liver function test 02/06/2017   Abnormal thyroid blood test 02/06/2017   Anxiety 05/22/2007   Bipolar disorder 05/22/2007   per patient no   Depression    Fatigue 10/28/2016   Leg pain, bilateral 03/05/2013   Plantar fasciitis, bilateral 03/05/2013   Psoriasis 04/24/2007   Weight gain 01/19/2014    Past Surgical History:  Procedure Laterality Date   CESAREAN SECTION     x2 requiring blood transfusion (BTL with 2nd one)   CHOLECYSTECTOMY     heart ablation     times 2   TONSILECTOMY, ADENOIDECTOMY, BILATERAL MYRINGOTOMY AND TUBES      There were no vitals filed for this visit.    Subjective Assessment - 06/22/21 1457     Subjective Pt states urine stream felt different and has been having excruciating pain with intercourse. Pt is on estrogen cream 2x/week.  Pt still has pain at c-section scar.    Patient Stated Goals be able to have intercourse and feel stronger    Currently in Pain? No/denies                [facility] PT Assessment - 06/22/21 0001       Assessment   Medical Diagnosis N81.10 (ICD-10-CM) - Female cystocele    Referring Provider (PT) Jerene Bears, MD    Onset Date/Surgical Date --   did fracture tailbone last year     Precautions   Precautions None       Balance Screen   Has the patient fallen in the past 6 months No      Home Environment   Living Environment Private residence    Living Arrangements Spouse/significant other;Children   daughter Deveron Furlong, son Vin     Prior Function   Level of Independence Independent    Vocation Part time employment    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     Pelvic Floor Special Questions - 06/22/21 0001     Prior Pregnancies Yes    Number of Pregnancies 2    Number of C-Sections 2    Currently Sexually Active Yes    Is this Painful Yes    Marinoff Scale pain prevents any attempts at intercourse    Urinary Leakage Yes   has been happening about 6 month   How often occasional    Pad use no because it is just a little    Activities that cause leaking Coughing;Sneezing;Laughing;Lifting;Intercourse    Urinary frequency go all the time but drinking a lot of water    Exam Type Vaginal    Palpation tight bulbocavernosis and felt stool in rectum    Strength weak squeeze, no lift    Strength # of reps 3    Strength # of seconds 1    Tone high                       PT Education - 06/23/21 0456     Education Details moisturizers and how to use, how to do perinal massage    Person(s) Educated Patient    Methods Explanation;Demonstration;Verbal cues;Handout    Comprehension Verbalized  understanding              PT Short Term Goals - 06/23/21 0427       PT SHORT TERM GOAL #1   Title Pt will be ind with self massage and moisturizing routine    Time 4    Period Weeks    Status New    Target Date 07/20/21      PT SHORT TERM GOAL #2   Title Pt will be able to understand and use toileting techniques for improved BM.    Time 4    Period Weeks    Status New    Target Date 07/20/21               PT Long Term Goals - 06/23/21 0428       PT LONG TERM GOAL #1   Title Pt will be ind with advanced HEP for pelvic health and function without pain and leakage    Time 12    Period Weeks    Status New    Target Date 09/14/21      PT LONG TERM GOAL #2   Title Pt will report Marinoff score of 1/3 or less    Time 12    Period Weeks    Status New    Target Date 09/14/21      PT LONG TERM GOAL #3   Title Pt will be able to demonstrate 3/5 MMT and hold for at least 10 seconds due to improved pelvic floor soft tissue pliability and strength    Time 12    Period Weeks    Status New    Target Date 09/14/21      PT LONG TERM GOAL #4   Title Pt will report 50% less leakage    Time 12    Period Weeks    Status New    Target Date 09/14/21                    Plan - 06/23/21 0437     Clinical Impression Statement Pt is a friendly 55 y/o female who has recently been experiencing severe pain during intercourse.  She reports her last menses was a little over one year ago, so recently in menopaus.  She also has some stress incontinence that has been present for a while, but more recently her urine stream is different and feels prolapse especially towards the end of the day.  Pt has tension and tender to palpation in pelvic floor bil levators Lt>Rt.  Pelvic floor weakness of 2/5 MMT and sustains for 1-2 seconds only.  There is some unsteadiness of Lt hip and has recently tore her Lt plantarfascia causing her to wear a boot that she was recently discharged  from.  Pt has tight hamstrings, hip ER, and lumbar paraspinals.  She has decreased abdominal tone and is tender to palpation at the site of her c-section scar.  Pt will benefit from skilled PT to address all above mentioned impairments so she can return to normal activities without pain and improved pelvic organ health.    Personal Factors and Comorbidities Comorbidity 3+    Comorbidities 2 c-sections, plantarfascia tear Lt foot; menopaus    Examination-Activity Limitations Toileting;Continence    Examination-Participation Restrictions Community Activity;Interpersonal Relationship    Stability/Clinical Decision Making Evolving/Moderate complexity    Clinical Decision Making Moderate    Rehab Potential Excellent    PT Frequency 1x / week    PT Duration 12 weeks    PT Treatment/Interventions ADLs/Self Care Home Management;Biofeedback;Cryotherapy;Moist Heat;Electrical Stimulation;Therapeutic activities;Therapeutic exercise;Neuromuscular re-education;Patient/family education;Manual techniques;Taping;Dry needling;Passive range of motion    PT Next Visit Plan f/u on self stretch; HEP for stretches and breathing; lumbar fascial release; toileting techniques review squatty potty; abdominal massage for bowel health, internal STM and fascial release    PT Home Exercise Plan coconut and desert harvest self massage perineum    Consulted and Agree with Plan of Care Patient             Patient will benefit from skilled therapeutic intervention in order to improve the following deficits and impairments:  Pain, Impaired flexibility, Increased fascial restricitons, Decreased strength, Decreased coordination, Decreased range of motion, Increased muscle spasms  Visit Diagnosis: Cramp and spasm  Muscle weakness (generalized)  Unspecified lack of coordination     Problem List Patient Active Problem List   Diagnosis Date Noted   Severe episode of recurrent major depressive disorder, without psychotic  features (HCC)    Atrophic vaginitis 12/04/2019   Attention or concentration deficit 12/03/2019   Laryngopharyngeal reflux (LPR) 11/18/2019   Renal insufficiency 04/04/2018   Right hip pain 04/04/2018   Nausea 07/31/2017   Dizziness 07/27/2017   High blood pressure 07/27/2017   Atypical chest pain 07/27/2017   Hyperglycemia 07/27/2017   Abnormal thyroid blood test 02/06/2017   Abnormal liver function test 02/06/2017   Fatigue 10/28/2016   Severe headache 04/12/2015   Weight gain 01/19/2014   Plantar fasciitis, bilateral 03/05/2013   Leg pain, bilateral 03/05/2013   Routine general medical examination at a health care facility 08/03/2011   Palpitations 01/09/2011   Insomnia 12/10/2009   Calculus of gallbladder 05/22/2007   Anxiety and depression 05/22/2007   Psoriasis 04/24/2007    Junious Silk, PT 06/23/2021, 4:57 AM  [facility] @ [facility] [address] [place], [place], [address] Phone:     Fax:     Name: BRIDGID PRINTZ MRN: [id number] Date of Birth: 1965/10/25

## 2021-06-27 ENCOUNTER — Other Ambulatory Visit: Payer: Self-pay | Admitting: Family Medicine

## 2021-06-28 ENCOUNTER — Other Ambulatory Visit: Payer: Self-pay | Admitting: Family Medicine

## 2021-06-28 NOTE — Telephone Encounter (Signed)
Requesting: alprazolam 0.25mg   Contract: 12/03/2019 UDS: 10/19/2020 Last Visit: 12/11/2020 Next Visit: 07/05/2021 Last Refill: 04/26/2021 #90 and 0RF  Please Advise

## 2021-06-29 ENCOUNTER — Other Ambulatory Visit: Payer: Self-pay | Admitting: Family Medicine

## 2021-06-30 ENCOUNTER — Ambulatory Visit: Payer: BC Managed Care – PPO

## 2021-06-30 ENCOUNTER — Other Ambulatory Visit: Payer: Self-pay

## 2021-06-30 ENCOUNTER — Ambulatory Visit: Payer: BC Managed Care – PPO | Attending: Podiatry | Admitting: Physical Therapy

## 2021-06-30 ENCOUNTER — Encounter: Payer: Self-pay | Admitting: Physical Therapy

## 2021-06-30 DIAGNOSIS — N8110 Cystocele, unspecified: Secondary | ICD-10-CM | POA: Insufficient documentation

## 2021-06-30 DIAGNOSIS — R252 Cramp and spasm: Secondary | ICD-10-CM | POA: Diagnosis not present

## 2021-06-30 DIAGNOSIS — M79672 Pain in left foot: Secondary | ICD-10-CM | POA: Diagnosis not present

## 2021-06-30 DIAGNOSIS — R279 Unspecified lack of coordination: Secondary | ICD-10-CM | POA: Insufficient documentation

## 2021-06-30 DIAGNOSIS — M6281 Muscle weakness (generalized): Secondary | ICD-10-CM | POA: Diagnosis not present

## 2021-06-30 NOTE — Therapy (Signed)
[facility] [address] [place], [place], [address] Phone: [phone]   Fax:  [phone]  Physical Therapy Evaluation  Patient Details  Name: Sara West MRN: [id number] Date of Birth: 1966-08-05 Referring Provider (PT): Edwin Cap, [place]   Encounter Date: 06/30/2021   PT End of Session - 06/30/21 1033     Visit Number 2    Date for PT Re-Evaluation 08/25/21   09/14/21 PF end of cert   Authorization Type [employer]    PT Start Time 0930    PT Stop Time 1020    PT Time Calculation (min) 50 min    Activity Tolerance Patient tolerated treatment well    Behavior During Therapy [facility] for tasks assessed/performed             Past Medical History:  Diagnosis Date   Abnormal liver function test 02/06/2017   Abnormal thyroid blood test 02/06/2017   Anxiety 05/22/2007   Bipolar disorder 05/22/2007   per patient no   Depression    Fatigue 10/28/2016   Leg pain, bilateral 03/05/2013   Plantar fasciitis, bilateral 03/05/2013   Psoriasis 04/24/2007   Weight gain 01/19/2014    Past Surgical History:  Procedure Laterality Date   CESAREAN SECTION     x2 requiring blood transfusion (BTL with 2nd one)   CHOLECYSTECTOMY     heart ablation     times 2   TONSILECTOMY, ADENOIDECTOMY, BILATERAL MYRINGOTOMY AND TUBES      There were no vitals filed for this visit.    Subjective Assessment - 06/30/21 0930     Subjective Pt currently a Pt at this facility for PF dysfunction.  She is also referred for Lt foot partial tear of plantar fascia.  She has been wearing a walking boot x 3 months.  Initial pain was heel pain "pebble" in bottom of heel, got an injection, and a week later felt an excrutiating pop.  Has ongoing heel pain, PF insertion pain, and tingling into bottom of foot. Meloxicam was started 2 weeks ago for bone inflammation in heel which has helped reduce pain. Pain is worse when on feet for too long.  Wants to be able  to return to walking for exercise, yardwork, and exercise.  Has returned to wearing tennis shoes with custom orthotics.    Limitations Walking    How long can you walk comfortably? 1 hour    Diagnostic tests MRI 02/26/21: Lt partial thickness tear of plantar fascia, split tear of peroneus longus tendon behind malleolus    Patient Stated Goals return to walking for exercise, yardwork, return to active lifestyle in general    Currently in Pain? Yes    Pain Score 5     Pain Location Heel    Pain Orientation Left    Pain Descriptors / Indicators Tingling;Sharp    Pain Type Acute pain;Chronic pain    Pain Radiating Towards heel pain raditating to arch of foot with tingling    Pain Onset More than a month ago    Pain Frequency Intermittent    Aggravating Factors  walking or being on feet > 1 hour, first few steps after resting (tingling), can have stabbing heel pain without WB occassionally    Pain Relieving Factors Advil, Meloxicam    Effect of Pain on Daily Activities has to take breaks after being on feet >1 hour  [facility] PT Assessment - 06/30/21 0001       Assessment   Medical Diagnosis M72.2 (ICD-10-CM) - Plantar fasciitis, bilateral  M62.9 (ICD-10-CM) - Nontraumatic tear of plantar fascia    Referring Provider (PT) Edwin Cap, DPM    Next MD Visit late Oct    Prior Therapy currently in PT at this facility for pelvic floor      Precautions   Precautions None      Restrictions   Weight Bearing Restrictions No      Balance Screen   Has the patient fallen in the past 6 months No      Home Environment   Living Environment Private residence    Living Arrangements Children;Spouse/significant other      Prior Function   Level of Independence Independent    Vocation Part time employment    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    Leisure walking but not since May      Observation/Other Assessments   Focus on Therapeutic Outcomes (FOTO)  55%,  goal 68%      Functional Tests   Functional tests Squat;Single leg stance;Other      Squat   Comments able to fully squat with heel contact      Single Leg Stance   Comments good balance on Rt/Lt, + Trendelenburg lateral lean on Lt      Other:   Other/ Comments able to heel raise bil      Posture/Postural Control   Posture/Postural Control Postural limitations    Posture Comments high arch bil, calcaneus supinated      ROM / Strength   AROM / PROM / Strength AROM;Strength      AROM   AROM Assessment Site Ankle    Right/Left Ankle Right;Left    Right Ankle Dorsiflexion 8    Right Ankle Plantar Flexion 35    Left Ankle Dorsiflexion 5    Left Ankle Plantar Flexion 35      PROM   Overall PROM Comments hip ER 50% bil      Strength   Overall Strength Comments Rt hip 4+/5 to 5/5    Strength Assessment Site Hip    Right/Left Hip Left    Left Hip External Rotation 4/5    Left Hip Internal Rotation 4/5    Left Hip ABduction 4-/5      Flexibility   Soft Tissue Assessment /Muscle Length yes   Lt > Rt gastroc/soleus complex limited   Hamstrings limited 25% bil      Palpation   Spinal mobility Lt hypomobile subtalar joint, talocrural joint, bil midfoot stiffness into plantar mobility    Palpation comment TPs present bil heads of gastroc, soleus, tibialis posterior on Lt, signif tenderness at PF insertion and along medial subtalar joint      Ambulation/Gait   Ambulation/Gait Yes    Gait Comments feet remain supinated, slight intoeing Lt but Pt can control, calcaneus supinated on Lt throughout stance phase                        Objective measurements completed on examination: See above findings.       [facility] Adult PT Treatment/Exercise - 06/30/21 0001       Self-Care   Self-Care Other Self-Care Comments    Other Self-Care Comments  emailed 2 YouTube videos for Lt plantar fascia taping options, initiated LE stretching and ankle A/ROM for HEP  PT Short Term Goals - 06/30/21 1044       PT SHORT TERM GOAL #1   Title Pt will be ind with self massage and moisturizing routine    Time 4    Period Weeks    Status New    Target Date 07/20/21      PT SHORT TERM GOAL #2   Title Pt will be able to understand and use toileting techniques for improved BM.    Time 4    Period Weeks    Status New    Target Date 07/20/21      PT SHORT TERM GOAL #3   Title FOOT GOAL: Pt will achieve Lt ankle DF at least 8 deg to demo improved flexibility and joint mobility    Time 4    Period Weeks    Status New    Target Date 07/28/21      PT SHORT TERM GOAL #4   Title FOOT GOAL: Pt will achieve improved Lt hamstring and hip flexibility to normalize LE mobility    Time 4    Period Weeks    Status New    Target Date 07/28/21      PT SHORT TERM GOAL #5   Title FOOT GOAL: Pt will learn and use taping techniques to use with trial of exericse walks for up to 15 min in addition to daily activities without exacerbation of pain    Time 4    Period Weeks    Status New    Target Date 07/28/21               PT Long Term Goals - 06/30/21 1047       PT LONG TERM GOAL #1   Title Pt will be ind with advanced HEP for pelvic health and function without pain and leakage    Time 12    Period Weeks    Status New    Target Date 09/14/21      PT LONG TERM GOAL #2   Title Pt will report Marinoff score of 1/3 or less    Time 12    Period Weeks    Status New      PT LONG TERM GOAL #3   Title Pt will be able to demonstrate 3/5 MMT and hold for at least 10 seconds due to improved pelvic floor soft tissue pliability and strength    Time 12    Period Weeks    Status New      PT LONG TERM GOAL #4   Title Pt will report 50% less leakage    Time 12    Status New      PT LONG TERM GOAL #5   Title FOOT GOAL: Pt will achieve at least 4+/5 strength in Lt hip abd and ER for improved tolerance of walking for exercise and  proximal control of Lt LE.    Time 8    Period Weeks    Status New    Target Date 08/25/21      Additional Long Term Goals   Additional Long Term Goals Yes      PT LONG TERM GOAL #6   Title FOOT GOAL:  Pt will be ind with advanced HEP and understand how to safely progress activity without exacerbation of Lt heel pain.    Time 8    Period Weeks    Status New    Target Date 08/25/21      PT LONG TERM  GOAL #7   Title FOOT GOAL: Pt will improve FOTO score to at least 68% for improved function of Lt foot.    Baseline 55%    Time 8    Period Weeks    Status New    Target Date 08/25/21      PT LONG TERM GOAL #8   Title Pt will report at least 70% return to desired actiivty level including walking for exercise without exacerbation of Lt heel pain.    Time 8    Period Weeks    Status New    Target Date 08/25/21                    Plan - 06/30/21 1035     Clinical Impression Statement Pt is a pleasant 55yo female with history of partial tear of Lt plantar fascia for which she wore a walking boot x 3 mos.  She has ongoing heel pain and tingling into plantar aspect of foot, worse with initial steps and after walking/being on feet for approx 1 hour.  She is very busy and active and wishes to return to walking for exercise, yard work, and other forms of exercise.  She presents with Lt foot supination, high rigid arches, Lt calcaneal supination with stiffness of subtalar joint, midfoot (bil) and talocrural joint.  She has limited flexibility of gastroc/soleus complex, hamstring, and piriformis with TPs along bil gastroc heads, tib posterior and soleus.  There is signif tenderness to palpation along medial subtalar joint and calcaneal tubercle at PF attachment site.  Fascial restriction is present along Lt PF.  Pt has atrophy and weakness of Lt PF and has aysmmetrical weakness in Lt hip 4-/5 to 4/5 for abd, ER and IR.  FOTO score is 55% with goal of 68%.  Pt will benefit from skilled PT  to address evaluation findings and help Pt transition back to full actiivty level with improved pain.    Examination-Activity Limitations Locomotion Level;Squat    Examination-Participation Restrictions Community Activity;Yard Work    Stability/Clinical Decision Making Stable/Uncomplicated    Optometrist Low    Rehab Potential Excellent    PT Frequency 1x / week    PT Duration 8 weeks    PT Treatment/Interventions ADLs/Self Care Home Management;Aquatic Therapy;Cryotherapy;Moist Heat;Electrical Stimulation;Therapeutic activities;Functional mobility training;Gait training;Neuromuscular re-education;Patient/family education;Taping;Manual techniques;Dry needling;Passive range of motion;Joint Manipulations;Therapeutic exercise    PT Next Visit Plan review medbridge stretches, manual therapy for subtalar, midfoot, talocrural mobility on Lt, DN or STM gastroc/soleus/tib posterior, progress ankle ROM and arch and ankle strengthening    PT Home Exercise Plan Access Code: [email or web address]    Consulted and Agree with Plan of Care Patient             Patient will benefit from skilled therapeutic intervention in order to improve the following deficits and impairments:  Abnormal gait, Decreased range of motion, Increased fascial restricitons, Impaired flexibility, Pain, Improper body mechanics, Decreased strength, Postural dysfunction, Decreased activity tolerance, Increased muscle spasms, Hypomobility  Visit Diagnosis: Pain in left foot - Plan: PT plan of care cert/re-cert  Muscle weakness (generalized) - Plan: PT plan of care cert/re-cert  Cramp and spasm - Plan: PT plan of care cert/re-cert     Problem List Patient Active Problem List   Diagnosis Date Noted   Severe episode of recurrent major depressive disorder, without psychotic features (HCC)    Atrophic vaginitis 12/04/2019   Attention or concentration deficit 12/03/2019   Laryngopharyngeal reflux (LPR) 11/18/2019  Renal  insufficiency 04/04/2018   Right hip pain 04/04/2018   Nausea 07/31/2017   Dizziness 07/27/2017   High blood pressure 07/27/2017   Atypical chest pain 07/27/2017   Hyperglycemia 07/27/2017   Abnormal thyroid blood test 02/06/2017   Abnormal liver function test 02/06/2017   Fatigue 10/28/2016   Severe headache 04/12/2015   Weight gain 01/19/2014   Plantar fasciitis, bilateral 03/05/2013   Leg pain, bilateral 03/05/2013   Routine general medical examination at a health care facility 08/03/2011   Palpitations 01/09/2011   Insomnia 12/10/2009   Calculus of gallbladder 05/22/2007   Anxiety and depression 05/22/2007   Psoriasis 04/24/2007    Morton Peters, PT 06/30/21 10:54 AM   [facility] [address] [place], [place], [address] Phone: [phone]   Fax:  [phone]  Name: Sara West MRN: [id number] Date of Birth: [birth year]

## 2021-06-30 NOTE — Patient Instructions (Signed)
Access Code: [email or web address] URL: [email or web address] Date: 06/30/2021 Prepared by: Loistine Simas Abdalrahman Clementson  Exercises Seated Calf Towel Stretch - 2 x daily - 7 x weekly - 1 sets - 2 reps - 30 hold Seated Hamstring Stretch - 2 x daily - 7 x weekly - 1 sets - 2 reps - 30 hold Seated Piriformis Stretch - 2 x daily - 7 x weekly - 1 sets - 2 reps - 30 hold Seated Figure 4 Piriformis Stretch - 2 x daily - 7 x weekly - 1 sets - 2 reps - 30 hold Seated Ankle Circles - 2 x daily - 7 x weekly - 1 sets - 20 reps Supine Ankle Dorsiflexion and Plantarflexion AROM - 2 x daily - 7 x weekly - 1 sets - 20 reps

## 2021-07-01 ENCOUNTER — Telehealth: Payer: Self-pay | Admitting: Podiatry

## 2021-07-01 NOTE — Telephone Encounter (Signed)
Pt left message yesterday asking for a call back as she has some questions about orthotics. She was wanting to get another pair..  I returned call and spoke to pt and I could not look at her insurance and tell if they would cover a second pr. She stated she called them yesterday and could not understand them. I todl her if she calls again ask if there are any limitations on the orthotics and to make sure she gets a reference number and name of person she spoke to. I also told her that if she wanted a second pair and orders them within the 6 months mark we do self pay at 219.00 for the pair.

## 2021-07-02 ENCOUNTER — Other Ambulatory Visit: Payer: Self-pay | Admitting: Family Medicine

## 2021-07-02 MED ORDER — AMPHETAMINE-DEXTROAMPHETAMINE 10 MG PO TABS: 10.0000 mg | ORAL_TABLET | Freq: Every day | ORAL | 0 refills | Status: DC

## 2021-07-02 NOTE — Telephone Encounter (Signed)
Requesting: Adderall 10mg   Contract: 12/03/19 UDS: 10/19/2020 Last Visit: 12/11/2020 Next Visit: 07/05/2021 Last Refill: 05/27/2021 #30 and 0RF  Please Advise

## 2021-07-05 ENCOUNTER — Encounter: Payer: Self-pay | Admitting: Family Medicine

## 2021-07-05 ENCOUNTER — Ambulatory Visit (INDEPENDENT_AMBULATORY_CARE_PROVIDER_SITE_OTHER): Payer: BC Managed Care – PPO | Admitting: Family Medicine

## 2021-07-05 ENCOUNTER — Other Ambulatory Visit: Payer: Self-pay

## 2021-07-05 VITALS — BP 126/78 | HR 63 | Temp 97.9°F | Resp 16 | Wt 159.2 lb

## 2021-07-05 DIAGNOSIS — F32A Depression, unspecified: Secondary | ICD-10-CM

## 2021-07-05 DIAGNOSIS — R739 Hyperglycemia, unspecified: Secondary | ICD-10-CM

## 2021-07-05 DIAGNOSIS — F419 Anxiety disorder, unspecified: Secondary | ICD-10-CM

## 2021-07-05 DIAGNOSIS — Z79899 Other long term (current) drug therapy: Secondary | ICD-10-CM

## 2021-07-05 DIAGNOSIS — R002 Palpitations: Secondary | ICD-10-CM | POA: Diagnosis not present

## 2021-07-05 DIAGNOSIS — N289 Disorder of kidney and ureter, unspecified: Secondary | ICD-10-CM | POA: Diagnosis not present

## 2021-07-05 MED ORDER — BUSPIRONE HCL 7.5 MG PO TABS: 7.5000 mg | ORAL_TABLET | Freq: Two times a day (BID) | ORAL | 3 refills | Status: DC

## 2021-07-05 NOTE — Patient Instructions (Addendum)
Learn something new   Management of Memory Problems  There are some general things you can do to help manage your memory problems.  Your memory may not in fact recover, but by using techniques and strategies you will be able to manage your memory difficulties better.  1)  Establish a routine. Try to establish and then stick to a regular routine.  By doing this, you will get used to what to expect and you will reduce the need to rely on your memory.  Also, try to do things at the same time of day, such as taking your medication or checking your calendar first thing in the morning. Think about think that you can do as a part of a regular routine and make a list.  Then enter them into a daily planner to remind you.  This will help you establish a routine.  2)  Organize your environment. Organize your environment so that it is uncluttered.  Decrease visual stimulation.  Place everyday items such as keys or cell phone in the same place every day (ie.  Basket next to front door) Use post it notes with a brief message to yourself (ie. Turn off light, lock the door) Use labels to indicate where things go (ie. Which cupboards are for food, dishes, etc.) Keep a notepad and pen by the telephone to take messages  3)  Memory Aids A diary or journal/notebook/daily planner Making a list (shopping list, chore list, to do list that needs to be done) Using an alarm as a reminder (kitchen timer or cell phone alarm) Using cell phone to store information (Notes, Calendar, Reminders) Calendar/White board placed in a prominent position Post-it notes  In order for memory aids to be useful, you need to have good habits.  It's no good remembering to make a note in your journal if you don't remember to look in it.  Try setting aside a certain time of day to look in journal.  4)  Improving mood and managing fatigue. There may be other factors that contribute to memory difficulties.  Factors, such as anxiety,  depression and tiredness can affect memory. Regular gentle exercise can help improve your mood and give you more energy. Simple relaxation techniques may help relieve symptoms of anxiety Try to get back to completing activities or hobbies you enjoyed doing in the past. Learn to pace yourself through activities to decrease fatigue. Find out about some local support groups where you can share experiences with others. Try and achieve 7-8 hours of sleep at night.

## 2021-07-05 NOTE — Progress Notes (Signed)
Subjective:   By signing my name below, I, Sara West, attest that this documentation has been prepared under the direction and in the presence of Mosie Lukes, MD. 07/05/2021     Patient ID: Sara West, female    DOB: 09-08-1966, 55 y.o.   MRN: [id number]  Chief Complaint  Patient presents with   Medication Refill    HPI Patient is in today for an office visit.  She has been experiencing family issues at home. Her kids are home schooled and her daughter has been diagnosed with seizures.  She has been able to quit drinking alcohol but is trying to quit smoking marijuana because it helps to relieve her anxiety.  She reports she is not doing well on Xanax because it either puts her to sleep or makes her feel terrible the next day. She does not feel the effects of 5 mg Buspar and does not know exactly what it does. She has stopped taking 10 mg hydroxyzine.   She was using meloxicam for inflammation in the bone in her left foot and is going to physical therapy for it. She is also going to physical therapy for her pelvic floor after being diagnosed with cystocele.  She also feels she has been getting forgetful about things like parking spots, remembering if she fed her dog or little things around the house.  She is not interested in getting the flu vaccine.   Past Medical History:  Diagnosis Date   Abnormal liver function test 02/06/2017   Abnormal thyroid blood test 02/06/2017   Anxiety 05/22/2007   Bipolar disorder 05/22/2007   per patient no   Depression    Fatigue 10/28/2016   Leg pain, bilateral 03/05/2013   Plantar fasciitis, bilateral 03/05/2013   Psoriasis 04/24/2007   Weight gain 01/19/2014    Past Surgical History:  Procedure Laterality Date   CESAREAN SECTION     x2 requiring blood transfusion (BTL with 2nd one)   CHOLECYSTECTOMY     heart ablation     times 2   TONSILECTOMY, ADENOIDECTOMY, BILATERAL MYRINGOTOMY AND TUBES      Family History  Problem Relation  Age of Onset   Alcohol abuse Mother    Cancer Mother        lung, smoker   Arthritis Sister        walker, uses for balance issues   Hypertension Sister    Anorexia nervosa Daughter    Breast cancer Paternal Aunt     Social History   Socioeconomic History   Marital status: Married    Spouse name: Not on file   Number of children: Not on file   Years of education: Not on file   Highest education level: Not on file  Occupational History   Occupation: free lance for [employer]  Tobacco Use   Smoking status: Former    Types: Cigarettes    Quit date: 11/19/2005    Years since quitting: 15.6   Smokeless tobacco: Never  Vaping Use   Vaping Use: Never used  Substance and Sexual Activity   Alcohol use: Yes   Drug use: Yes    Types: Marijuana   Sexual activity: Yes    Birth control/protection: Surgical    Comment: BTL  Other Topics Concern   Not on file  Social History Narrative   Not on file   Social Determinants of Health   Financial Resource Strain: Not on file  Food Insecurity: Not on file  Transportation  Needs: Not on file  Physical Activity: Not on file  Stress: Not on file  Social Connections: Not on file  Intimate Partner Violence: Not on file    Outpatient Medications Prior to Visit  Medication Sig Dispense Refill   amphetamine-dextroamphetamine (ADDERALL) 10 MG tablet Take 1 tablet (10 mg total) by mouth daily. September 2022 30 tablet 0   citalopram (CELEXA) 40 MG tablet Take 1 tablet (40 mg total) by mouth daily. 90 tablet 1   Efinaconazole 10 % SOLN Apply 1 drop topically daily. 4 mL 11   meloxicam (MOBIC) 15 MG tablet Take 1 tablet (15 mg total) by mouth daily. 30 tablet 3   busPIRone (BUSPAR) 5 MG tablet TAKE 1 TABLET BY MOUTH TWICE A DAY 180 tablet 0   ALPRAZolam (XANAX) 0.25 MG tablet TAKE 1/2 TO 2 TABLETS BY MOUTH TWICE DAILY AS NEEDED FOR ANXIETY/INSOMNIA 90 tablet 0   hydrOXYzine (VISTARIL) 25 MG capsule Take 25 mg by mouth every evening.  (Patient not taking: Reported on 07/05/2021)     hydrOXYzine (ATARAX/VISTARIL) 10 MG tablet Take 0.5-2 tablets (5-20 mg total) by mouth every 8 (eight) hours as needed for anxiety (insomnia). (Patient not taking: Reported on 07/05/2021) 40 tablet 1   No facility-administered medications prior to visit.    Allergies  Allergen Reactions   Tylenol With Codeine #3 [Acetaminophen-Codeine] Nausea Only    Review of Systems  Constitutional:  Negative for fever and malaise/fatigue.  HENT:  Negative for congestion.   Eyes:  Negative for redness.  Respiratory:  Negative for shortness of breath.   Cardiovascular:  Negative for chest pain, palpitations and leg swelling.  Gastrointestinal:  Negative for abdominal pain, blood in stool and nausea.  Genitourinary:  Negative for dysuria and frequency.  Musculoskeletal:  Negative for falls.  Skin:  Negative for rash.  Neurological:  Negative for dizziness, loss of consciousness and headaches.  Endo/Heme/Allergies:  Negative for polydipsia.  Psychiatric/Behavioral:  Positive for depression. The patient is nervous/anxious.       Objective:    Physical Exam Constitutional:      General: She is not in acute distress.    Appearance: She is well-developed.  HENT:     Head: Normocephalic and atraumatic.  Eyes:     Conjunctiva/sclera: Conjunctivae normal.  Neck:     Thyroid: No thyromegaly.  Cardiovascular:     Rate and Rhythm: Normal rate and regular rhythm.     Heart sounds: Normal heart sounds. No murmur heard. Pulmonary:     Effort: Pulmonary effort is normal. No respiratory distress.     Breath sounds: Normal breath sounds.  Abdominal:     General: Bowel sounds are normal. There is no distension.     Palpations: Abdomen is soft. There is no mass.     Tenderness: There is no abdominal tenderness.  Musculoskeletal:     Cervical back: Neck supple.  Lymphadenopathy:     Cervical: No cervical adenopathy.  Skin:    General: Skin is warm and  dry.  Neurological:     Mental Status: She is alert and oriented to person, place, and time.  Psychiatric:        Behavior: Behavior normal.    BP 126/78   Pulse 63   Temp 97.9 F (36.6 C)   Resp 16   Wt 159 lb 3.2 oz (72.2 kg)   LMP 10/10/2016 (Approximate)   SpO2 99%   BMI 26.90 kg/m  Wt Readings from Last 3 Encounters:  07/05/21  159 lb 3.2 oz (72.2 kg)  05/06/21 164 lb 9.6 oz (74.7 kg)  10/19/20 160 lb (72.6 kg)    Diabetic Foot Exam - Simple   No data filed    Lab Results  Component Value Date   WBC 6.7 05/06/2021   HGB 12.7 05/06/2021   HCT 38.4 05/06/2021   PLT 256 05/06/2021   GLUCOSE 104 (H) 05/06/2021   CHOL 188 12/03/2019   TRIG 87.0 12/03/2019   HDL 68.50 12/03/2019   LDLCALC 102 (H) 12/03/2019   ALT 29 05/06/2021   AST 22 05/06/2021   NA 141 05/06/2021   K 5.0 05/06/2021   CL 101 05/06/2021   CREATININE 0.73 05/06/2021   BUN 15 05/06/2021   CO2 26 05/06/2021   TSH 2.62 12/03/2019   HGBA1C 6.2 12/03/2019    Lab Results  Component Value Date   TSH 2.62 12/03/2019   Lab Results  Component Value Date   WBC 6.7 05/06/2021   HGB 12.7 05/06/2021   HCT 38.4 05/06/2021   MCV 91 05/06/2021   PLT 256 05/06/2021   Lab Results  Component Value Date   NA 141 05/06/2021   K 5.0 05/06/2021   CO2 26 05/06/2021   GLUCOSE 104 (H) 05/06/2021   BUN 15 05/06/2021   CREATININE 0.73 05/06/2021   BILITOT <0.2 05/06/2021   ALKPHOS 61 05/06/2021   AST 22 05/06/2021   ALT 29 05/06/2021   PROT 7.1 05/06/2021   ALBUMIN 4.6 05/06/2021   CALCIUM 9.9 05/06/2021   ANIONGAP 15 10/19/2020   EGFR 97 05/06/2021   GFR 85.92 12/03/2019   Lab Results  Component Value Date   CHOL 188 12/03/2019   Lab Results  Component Value Date   HDL 68.50 12/03/2019   Lab Results  Component Value Date   LDLCALC 102 (H) 12/03/2019   Lab Results  Component Value Date   TRIG 87.0 12/03/2019   Lab Results  Component Value Date   CHOLHDL 3 12/03/2019   Lab Results   Component Value Date   HGBA1C 6.2 12/03/2019       Assessment & Plan:   Problem List Items Addressed This Visit     Palpitations    Flares up with stress but has been tolerable recently      Hyperglycemia    hgba1c acceptable, minimize simple carbs. Increase exercise as tolerated.       Renal insufficiency    Hydrate and monitor      Anxiety and depression    Still struggles with her marriage but has been more able to take a deep breath and not get triggered as easily. She is trying to keep things steady and calm at home for the kids. Her daughter is 33 and having pseudoseizures and she is now being homeschooled. Her son is 46 yo is now on anti depressants. She is struggling with trying to quit drinking alcohol and cannabis but she is trying. Alcohol is done. Harder to quit the other. She will continues to try and stop. She is also worried about her memory and she feels overwhelmed easily. She is questioning if she is having memory loss. 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 Can consider neuropsych testing in the future.       Relevant Medications   busPIRone (BUSPAR) 7.5 MG tablet   Other Visit Diagnoses     High risk medication use    -  Primary   Relevant Orders   Drug Monitoring Panel [id number] , Urine       Meds ordered this encounter  Medications   busPIRone (BUSPAR) 7.5 MG tablet    Sig: Take 1 tablet (7.5 mg total) by mouth 2 (two) times daily.    Dispense:  60 tablet    Refill:  3    I,Sara West,acting as a scribe for Penni Homans, MD.,have documented all relevant documentation on the behalf of Penni Homans, MD,as directed by  Penni Homans, MD while in the presence of Penni Homans, MD.   I, Mosie Lukes, MD., personally preformed the services described in this documentation.  All medical record entries made by the scribe were at my direction and in my presence.  I have reviewed the chart and  discharge instructions (if applicable) and agree that the record reflects my personal performance and is accurate and complete. 07/05/2021

## 2021-07-05 NOTE — Assessment & Plan Note (Signed)
Still struggles with her marriage but has been more able to take a deep breath and not get triggered as easily. She is trying to keep things steady and calm at home for the kids. Her daughter is 80 and having pseudoseizures and she is now being homeschooled. Her son is 55 yo is now on anti depressants. She is struggling with trying to quit drinking alcohol and cannabis but she is trying. Alcohol is done. Harder to quit the other. She will continues to try and stop. She is also worried about her memory and she feels overwhelmed easily. She is questioning if she is having memory loss. 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 Can consider neuropsych testing in the future.

## 2021-07-05 NOTE — Assessment & Plan Note (Signed)
Hydrate and monitor 

## 2021-07-05 NOTE — Assessment & Plan Note (Signed)
hgba1c acceptable, minimize simple carbs. Increase exercise as tolerated.  

## 2021-07-05 NOTE — Assessment & Plan Note (Signed)
Flares up with stress but has been tolerable recently

## 2021-07-06 DIAGNOSIS — F331 Major depressive disorder, recurrent, moderate: Secondary | ICD-10-CM | POA: Diagnosis not present

## 2021-07-08 ENCOUNTER — Other Ambulatory Visit: Payer: Self-pay

## 2021-07-08 ENCOUNTER — Encounter: Payer: Self-pay | Admitting: Physical Therapy

## 2021-07-08 ENCOUNTER — Ambulatory Visit: Payer: BC Managed Care – PPO | Admitting: Physical Therapy

## 2021-07-08 DIAGNOSIS — R252 Cramp and spasm: Secondary | ICD-10-CM

## 2021-07-08 DIAGNOSIS — N8110 Cystocele, unspecified: Secondary | ICD-10-CM | POA: Diagnosis not present

## 2021-07-08 DIAGNOSIS — R279 Unspecified lack of coordination: Secondary | ICD-10-CM

## 2021-07-08 DIAGNOSIS — M79672 Pain in left foot: Secondary | ICD-10-CM | POA: Diagnosis not present

## 2021-07-08 DIAGNOSIS — M6281 Muscle weakness (generalized): Secondary | ICD-10-CM | POA: Diagnosis not present

## 2021-07-08 LAB — DRUG MONITORING PANEL 376104, URINE
Amphetamine: 1582 ng/mL — ABNORMAL HIGH (ref <250)
Amphetamines: POSITIVE ng/mL — AB (ref <500)
Barbiturates: NEGATIVE ng/mL (ref <300)
Benzodiazepines: NEGATIVE ng/mL (ref <100)
Cocaine Metabolite: NEGATIVE ng/mL (ref <150)
Desmethyltramadol: NEGATIVE ng/mL (ref <100)
Methamphetamine: NEGATIVE ng/mL (ref <250)
Opiates: NEGATIVE ng/mL (ref <100)
Oxycodone: NEGATIVE ng/mL (ref <100)
Tramadol: NEGATIVE ng/mL (ref <100)

## 2021-07-08 LAB — DM TEMPLATE

## 2021-07-08 NOTE — Therapy (Signed)
[facility] @ [facility] [address] [place], [place], [address] Phone: [phone]   Fax:  [phone]  Physical Therapy Treatment  Patient Details  Name: Sara West MRN: [id number] Date of Birth: March 24, 1966 Referring Provider (PT): Edwin Cap, [place]   Encounter Date: 07/08/2021   PT End of Session - 07/08/21 1642     Visit Number 3    Date for PT Re-Evaluation 09/14/21    Authorization Type BCBS    PT Start Time 1533    PT Stop Time 1613    PT Time Calculation (min) 40 min    Activity Tolerance Patient tolerated treatment well    Behavior During Therapy [facility] for tasks assessed/performed             Past Medical History:  Diagnosis Date   Abnormal liver function test 02/06/2017   Abnormal thyroid blood test 02/06/2017   Anxiety 05/22/2007   Bipolar disorder 05/22/2007   per patient no   Depression    Fatigue 10/28/2016   Leg pain, bilateral 03/05/2013   Plantar fasciitis, bilateral 03/05/2013   Psoriasis 04/24/2007   Weight gain 01/19/2014    Past Surgical History:  Procedure Laterality Date   CESAREAN SECTION     x2 requiring blood transfusion (BTL with 2nd one)   CHOLECYSTECTOMY     heart ablation     times 2   TONSILECTOMY, ADENOIDECTOMY, BILATERAL MYRINGOTOMY AND TUBES      There were no vitals filed for this visit.   Subjective Assessment - 07/08/21 1645     Subjective I had intercourse and desert harvest helped.  The bladder stuff is the same    Currently in Pain? No/denies                               [facility] Adult PT Treatment/Exercise - 07/08/21 0001       Self-Care   Other Self-Care Comments  toileting techniques; breathing and squatty potty      Exercises   Exercises Lumbar      Lumbar Exercises: Stretches   Double Knee to Chest Stretch 3 reps;30 seconds    Double Knee to Chest Stretch Limitations happy baby with breathing    Hip Flexor Stretch Right;Left;2  reps;30 seconds    Hip Flexor Stretch Limitations standing      Manual Therapy   Manual Therapy Soft tissue mobilization    Soft tissue mobilization lumbar and thoracic paraspinals and gluteal attachments                       PT Short Term Goals - 07/08/21 1635       PT SHORT TERM GOAL #1   Title Pt will be ind with self massage and moisturizing routine    Status Achieved      PT SHORT TERM GOAL #2   Title Pt will be able to understand and use toileting techniques for improved BM.    Status Achieved               PT Long Term Goals - 06/30/21 1047       PT LONG TERM GOAL #1   Title Pt will be ind with advanced HEP for pelvic health and function without pain and leakage    Time 12    Period Weeks    Status New    Target Date 09/14/21  PT LONG TERM GOAL #2   Title Pt will report Marinoff score of 1/3 or less    Time 12    Period Weeks    Status New      PT LONG TERM GOAL #3   Title Pt will be able to demonstrate 3/5 MMT and hold for at least 10 seconds due to improved pelvic floor soft tissue pliability and strength    Time 12    Period Weeks    Status New      PT LONG TERM GOAL #4   Title Pt will report 50% less leakage    Time 12    Status New      PT LONG TERM GOAL #5   Title FOOT GOAL: Pt will achieve at least 4+/5 strength in Lt hip abd and ER for improved tolerance of walking for exercise and proximal control of Lt LE.    Time 8    Period Weeks    Status New    Target Date 08/25/21      Additional Long Term Goals   Additional Long Term Goals Yes      PT LONG TERM GOAL #6   Title FOOT GOAL:  Pt will be ind with advanced HEP and understand how to safely progress activity without exacerbation of Lt heel pain.    Time 8    Period Weeks    Status New    Target Date 08/25/21      PT LONG TERM GOAL #7   Title FOOT GOAL: Pt will improve FOTO score to at least 68% for improved function of Lt foot.    Baseline 55%    Time 8     Period Weeks    Status New    Target Date 08/25/21      PT LONG TERM GOAL #8   Title Pt will report at least 70% return to desired actiivty level including walking for exercise without exacerbation of Lt heel pain.    Time 8    Period Weeks    Status New    Target Date 08/25/21                   Plan - 07/08/21 1635     Clinical Impression Statement Pt has been able to use massage techniques and felt that desert harvest relevium made intercourse pain free other than a small discomfort at the beginning.  Pt was tight throughout lumbar and lower thoracic paraspinals and responded well to STM. Pt was then provided with stretches and educated on breathing techniques for improved trunk mobility.  If still feeling less pain she will benefit from skilled PT to progress with core and pelvic strengthening.    PT Treatment/Interventions ADLs/Self Care Home Management;Aquatic Therapy;Cryotherapy;Moist Heat;Electrical Stimulation;Therapeutic activities;Functional mobility training;Gait training;Neuromuscular re-education;Patient/family education;Taping;Manual techniques;Dry needling;Passive range of motion;Joint Manipulations;Therapeutic exercise;Biofeedback    PT Next Visit Plan core and pelvic floor basic exercises    PT Home Exercise Plan Access Code: [email or web address]    Consulted and Agree with Plan of Care Patient             Patient will benefit from skilled therapeutic intervention in order to improve the following deficits and impairments:  Abnormal gait, Decreased range of motion, Increased fascial restricitons, Impaired flexibility, Pain, Improper body mechanics, Decreased strength, Postural dysfunction, Decreased activity tolerance, Increased muscle spasms, Hypomobility, Decreased coordination  Visit Diagnosis: Muscle weakness (generalized)  Cramp and spasm  Unspecified lack of coordination  Problem List Patient Active Problem List   Diagnosis Date Noted   Severe episode  of recurrent major depressive disorder, without psychotic features (HCC)    Atrophic vaginitis 12/04/2019   Attention or concentration deficit 12/03/2019   Laryngopharyngeal reflux (LPR) 11/18/2019   Renal insufficiency 04/04/2018   Right hip pain 04/04/2018   Nausea 07/31/2017   Dizziness 07/27/2017   High blood pressure 07/27/2017   Atypical chest pain 07/27/2017   Hyperglycemia 07/27/2017   Abnormal thyroid blood test 02/06/2017   Abnormal liver function test 02/06/2017   Fatigue 10/28/2016   Severe headache 04/12/2015   Weight gain 01/19/2014   Plantar fasciitis, bilateral 03/05/2013   Leg pain, bilateral 03/05/2013   Routine general medical examination at a health care facility 08/03/2011   Palpitations 01/09/2011   Insomnia 12/10/2009   Calculus of gallbladder 05/22/2007   Anxiety and depression 05/22/2007   Psoriasis 04/24/2007    Junious Silk, PT 07/08/2021, 4:49 PM  [facility] [address] [place], [place], [address] Phone: [phone]   Fax:  [phone]  Name: Sara West MRN: [id number] Date of Birth: 10/31/1965

## 2021-07-09 ENCOUNTER — Ambulatory Visit: Payer: BC Managed Care – PPO | Admitting: Physical Therapy

## 2021-07-09 DIAGNOSIS — R252 Cramp and spasm: Secondary | ICD-10-CM

## 2021-07-09 DIAGNOSIS — N8110 Cystocele, unspecified: Secondary | ICD-10-CM | POA: Diagnosis not present

## 2021-07-09 DIAGNOSIS — R279 Unspecified lack of coordination: Secondary | ICD-10-CM | POA: Diagnosis not present

## 2021-07-09 DIAGNOSIS — M79672 Pain in left foot: Secondary | ICD-10-CM | POA: Diagnosis not present

## 2021-07-09 DIAGNOSIS — M6281 Muscle weakness (generalized): Secondary | ICD-10-CM | POA: Diagnosis not present

## 2021-07-09 NOTE — Therapy (Signed)
[facility] [address] [place], [place], [address] Phone: [phone]   Fax:  [phone]  Physical Therapy Treatment  Patient Details  Name: Sara West MRN: [id number] Date of Birth: 04/27/1966 Referring Provider (PT): Edwin Cap, [place]   Encounter Date: 07/09/2021   PT End of Session - 07/09/21 1109     Visit Number 4    Date for PT Re-Evaluation 09/14/21    Authorization Type BCBS    PT Start Time 1015    PT Stop Time 1101    PT Time Calculation (min) 46 min    Activity Tolerance Patient tolerated treatment well             Past Medical History:  Diagnosis Date   Abnormal liver function test 02/06/2017   Abnormal thyroid blood test 02/06/2017   Anxiety 05/22/2007   Bipolar disorder 05/22/2007   per patient no   Depression    Fatigue 10/28/2016   Leg pain, bilateral 03/05/2013   Plantar fasciitis, bilateral 03/05/2013   Psoriasis 04/24/2007   Weight gain 01/19/2014    Past Surgical History:  Procedure Laterality Date   CESAREAN SECTION     x2 requiring blood transfusion (BTL with 2nd one)   CHOLECYSTECTOMY     heart ablation     times 2   TONSILECTOMY, ADENOIDECTOMY, BILATERAL MYRINGOTOMY AND TUBES      There were no vitals filed for this visit.   Subjective Assessment - 07/09/21 1016     Subjective Depends how long on feet.  Better than it was though.   Life has been busy so ex performance is limited.  Considering purchasing [employer] for house shoes and restarting yoga.    Patient Stated Goals return to walking for exercise, yardwork, return to active lifestyle in general    Currently in Pain? Yes    Pain Score 0-No pain    Pain Location Heel    Pain Orientation Left                               [facility] Adult PT Treatment/Exercise - 07/09/21 0001       Self-Care   Other Self-Care Comments  discussion on house shoes, avoiding barefoot; stretch before weight bearing;  return to yoga      Manual Therapy   Joint Mobilization grade 3 3x 30 sec each: talocural, subtalar, metatarsal and calcaneal mobs    Soft tissue mobilization gastroc, soleus, plantar fascia    Kinesiotex --   1 strip around heel; 2 strips arch support     Ankle Exercises: Stretches   Plantar Fascia Stretch 3 reps;20 seconds    Plantar Fascia Stretch Limitations seated all toe stetch                     PT Education - 07/09/21 1109     Education Details plantar fascial stretch seated    Person(s) Educated Patient    Methods Explanation;Demonstration    Comprehension Verbalized understanding;Returned demonstration              PT Short Term Goals - 07/08/21 1635       PT SHORT TERM GOAL #1   Title Pt will be ind with self massage and moisturizing routine    Status Achieved      PT SHORT TERM GOAL #2   Title Pt will be able to understand  and use toileting techniques for improved BM.    Status Achieved               PT Long Term Goals - 06/30/21 1047       PT LONG TERM GOAL #1   Title Pt will be ind with advanced HEP for pelvic health and function without pain and leakage    Time 12    Period Weeks    Status New    Target Date 09/14/21      PT LONG TERM GOAL #2   Title Pt will report Marinoff score of 1/3 or less    Time 12    Period Weeks    Status New      PT LONG TERM GOAL #3   Title Pt will be able to demonstrate 3/5 MMT and hold for at least 10 seconds due to improved pelvic floor soft tissue pliability and strength    Time 12    Period Weeks    Status New      PT LONG TERM GOAL #4   Title Pt will report 50% less leakage    Time 12    Status New      PT LONG TERM GOAL #5   Title FOOT GOAL: Pt will achieve at least 4+/5 strength in Lt hip abd and ER for improved tolerance of walking for exercise and proximal control of Lt LE.    Time 8    Period Weeks    Status New    Target Date 08/25/21      Additional Long Term Goals    Additional Long Term Goals Yes      PT LONG TERM GOAL #6   Title FOOT GOAL:  Pt will be ind with advanced HEP and understand how to safely progress activity without exacerbation of Lt heel pain.    Time 8    Period Weeks    Status New    Target Date 08/25/21      PT LONG TERM GOAL #7   Title FOOT GOAL: Pt will improve FOTO score to at least 68% for improved function of Lt foot.    Baseline 55%    Time 8    Period Weeks    Status New    Target Date 08/25/21      PT LONG TERM GOAL #8   Title Pt will report at least 70% return to desired actiivty level including walking for exercise without exacerbation of Lt heel pain.    Time 8    Period Weeks    Status New    Target Date 08/25/21                   Plan - 07/09/21 1017     Clinical Impression Statement Generally hypomobile in ankle joints but improved following joint mobilizations.  Tender point in plantar fascia attachment on the heel.   Discussed self care strategies to decrease load on this attachment.   Good gastroc soft tissue mobility following soft tissue mobilization.  Taping for arch support and to decrease fascial irritation.    Rehab Potential Excellent    PT Frequency 1x / week    PT Duration 8 weeks    PT Treatment/Interventions ADLs/Self Care Home Management;Aquatic Therapy;Cryotherapy;Moist Heat;Electrical Stimulation;Therapeutic activities;Functional mobility training;Gait training;Neuromuscular re-education;Patient/family education;Taping;Manual techniques;Dry needling;Passive range of motion;Joint Manipulations;Therapeutic exercise;Biofeedback    PT Next Visit Plan assess response to plantar fascia stretch; KT, joint mobs, soft tissue manual therapy  PT Home Exercise Plan Access Code: [email or web address]             Patient will benefit from skilled therapeutic intervention in order to improve the following deficits and impairments:  Abnormal gait, Decreased range of motion, Increased fascial restricitons,  Impaired flexibility, Pain, Improper body mechanics, Decreased strength, Postural dysfunction, Decreased activity tolerance, Increased muscle spasms, Hypomobility, Decreased coordination  Visit Diagnosis: Cramp and spasm  Pain in left foot     Problem List Patient Active Problem List   Diagnosis Date Noted   Severe episode of recurrent major depressive disorder, without psychotic features (HCC)    Atrophic vaginitis 12/04/2019   Attention or concentration deficit 12/03/2019   Laryngopharyngeal reflux (LPR) 11/18/2019   Renal insufficiency 04/04/2018   Right hip pain 04/04/2018   Nausea 07/31/2017   Dizziness 07/27/2017   High blood pressure 07/27/2017   Atypical chest pain 07/27/2017   Hyperglycemia 07/27/2017   Abnormal thyroid blood test 02/06/2017   Abnormal liver function test 02/06/2017   Fatigue 10/28/2016   Severe headache 04/12/2015   Weight gain 01/19/2014   Plantar fasciitis, bilateral 03/05/2013   Leg pain, bilateral 03/05/2013   Routine general medical examination at a health care facility 08/03/2011   Palpitations 01/09/2011   Insomnia 12/10/2009   Calculus of gallbladder 05/22/2007   Anxiety and depression 05/22/2007   Psoriasis 04/24/2007   Lavinia Sharps, PT 07/09/21 11:16 AM Phone: [phone] Fax: [phone]  Vivien Presto, PT 07/09/2021, 11:15 AM  [facility] @ [facility] [address] [place], [place], [address] Phone: [phone]   Fax:  [phone]  Name: Sara West MRN: [id number] Date of Birth: February 23, 1966

## 2021-07-09 NOTE — Patient Instructions (Signed)
Access Code: [email or web address] URL: [email or web address] Date: 07/09/2021 Prepared by: Lavinia Sharps  Exercises Seated Calf Towel Stretch - 2 x daily - 7 x weekly - 1 sets - 2 reps - 30 hold Seated Hamstring Stretch - 2 x daily - 7 x weekly - 1 sets - 2 reps - 30 hold Seated Piriformis Stretch - 2 x daily - 7 x weekly - 1 sets - 2 reps - 30 hold Seated Figure 4 Piriformis Stretch - 2 x daily - 7 x weekly - 1 sets - 2 reps - 30 hold Seated Ankle Circles - 2 x daily - 7 x weekly - 1 sets - 20 reps Supine Ankle Dorsiflexion and Plantarflexion AROM - 2 x daily - 7 x weekly - 1 sets - 20 reps Supine Pelvic Floor Stretch - 1 x daily - 7 x weekly - 1 sets - 3 reps - 30 sec hold Standing Hip Flexor Stretch - 3 x daily - 7 x weekly - 1 sets - 2 reps - 30 sec hold

## 2021-07-13 DIAGNOSIS — F411 Generalized anxiety disorder: Secondary | ICD-10-CM | POA: Diagnosis not present

## 2021-07-14 ENCOUNTER — Telehealth: Payer: Self-pay | Admitting: Podiatry

## 2021-07-14 DIAGNOSIS — M722 Plantar fascial fibromatosis: Secondary | ICD-10-CM | POA: Diagnosis not present

## 2021-07-14 NOTE — Telephone Encounter (Signed)
Pt called and has verified with her insurance about ordering another pair of orthotics and is wanting to proceed with ordering a second pair. I told pt I would get them ordered and call when they come in.

## 2021-07-14 NOTE — Telephone Encounter (Signed)
Great - thanks

## 2021-07-15 ENCOUNTER — Ambulatory Visit: Payer: BC Managed Care – PPO | Admitting: Physical Therapy

## 2021-07-15 ENCOUNTER — Other Ambulatory Visit: Payer: Self-pay

## 2021-07-15 DIAGNOSIS — M6281 Muscle weakness (generalized): Secondary | ICD-10-CM

## 2021-07-15 DIAGNOSIS — R252 Cramp and spasm: Secondary | ICD-10-CM

## 2021-07-15 DIAGNOSIS — R279 Unspecified lack of coordination: Secondary | ICD-10-CM

## 2021-07-15 DIAGNOSIS — M79672 Pain in left foot: Secondary | ICD-10-CM | POA: Diagnosis not present

## 2021-07-15 DIAGNOSIS — N8110 Cystocele, unspecified: Secondary | ICD-10-CM | POA: Diagnosis not present

## 2021-07-15 NOTE — Therapy (Signed)
[facility], [place], [address] Phone: [phone]   Fax:  [phone]  Physical Therapy Treatment  Patient Details  Name: Sara West MRN: [id number] Date of Birth: [birth year] Referring Provider (PT): Criselda Peaches, [place]   Encounter Date: 07/15/2021   PT End of Session - 07/15/21 1529     Visit Number 6    Date for PT Re-Evaluation 09/14/21    Authorization Type BCBS    PT Start Time 1446    PT Stop Time 1528    PT Time Calculation (min) 42 min    Activity Tolerance Patient tolerated treatment well    Behavior During Therapy [facility] for tasks assessed/performed             Past Medical History:  Diagnosis Date   Abnormal liver function test 02/06/2017   Abnormal thyroid blood test 02/06/2017   Anxiety 05/22/2007   Bipolar disorder 05/22/2007   per patient no   Depression    Fatigue 10/28/2016   Leg pain, bilateral 03/05/2013   Plantar fasciitis, bilateral 03/05/2013   Psoriasis 04/24/2007   Weight gain 01/19/2014    Past Surgical History:  Procedure Laterality Date   CESAREAN SECTION     x2 requiring blood transfusion (BTL with 2nd one)   CHOLECYSTECTOMY     heart ablation     times 2   TONSILECTOMY, ADENOIDECTOMY, BILATERAL MYRINGOTOMY AND TUBES      There were no vitals filed for this visit.   Subjective Assessment - 07/15/21 1535     Subjective Pt states things are going well, same since previous    Currently in Pain? No/denies                               [facility] Adult PT Treatment/Exercise - 07/15/21 1508       Lumbar Exercises: Standing   Other Standing Lumbar Exercises lean on table on forearms - kegel 10x      Lumbar Exercises: Supine   Ab Set 10 reps    Clam 10 reps   blue loop single leg   Bent Knee Raise 20 reps   blue loop   Bridge 15 reps   blue loop   Other Supine Lumbar Exercises ball overhead legs ll and legs in V    Other Supine Lumbar  Exercises ball roll out - red pball - 20x                     PT Education - 07/15/21 1531     Education Details Access Code: [email or web address]    Person(s) Educated Patient    Methods Explanation;Demonstration;Tactile cues;Verbal cues;Handout    Comprehension Verbalized understanding;Returned demonstration              PT Short Term Goals - 07/08/21 1635       PT SHORT TERM GOAL #1   Title Pt will be ind with self massage and moisturizing routine    Status Achieved      PT SHORT TERM GOAL #2   Title Pt will be able to understand and use toileting techniques for improved BM.    Status Achieved               PT Long Term Goals - 07/15/21 1531       PT LONG TERM GOAL #1   Title Pt will be ind with  advanced HEP for pelvic health and function without pain and leakage    Status On-going      PT LONG TERM GOAL #2   Title Pt will report Marinoff score of 1/3 or less    Status Partially Met      PT LONG TERM GOAL #3   Title Pt will be able to demonstrate 3/5 MMT and hold for at least 10 seconds due to improved pelvic floor soft tissue pliability and strength    Status On-going      PT LONG TERM GOAL #4   Title Pt will report 50% less leakage    Status On-going                   Plan - 07/15/21 1527     Clinical Impression Statement Pt did well with exercises today. Pt is still managing pain well and was able to work on gentle core and basic kegel exercise today.  Pt will benefit from skilled PT to continue with core and pelvic strength porgression for bladder control    PT Treatment/Interventions ADLs/Self Care Home Management;Aquatic Therapy;Cryotherapy;Moist Heat;Electrical Stimulation;Therapeutic activities;Functional mobility training;Gait training;Neuromuscular re-education;Patient/family education;Taping;Manual techniques;Dry needling;Passive range of motion;Joint Manipulations;Therapeutic exercise;Biofeedback    PT Next Visit Plan f/u on exercises  and leakage; core and hip strengthening    PT Home Exercise Plan Access Code: [email or web address]    Consulted and Agree with Plan of Care Patient             Patient will benefit from skilled therapeutic intervention in order to improve the following deficits and impairments:  Abnormal gait, Decreased range of motion, Increased fascial restricitons, Impaired flexibility, Pain, Improper body mechanics, Decreased strength, Postural dysfunction, Decreased activity tolerance, Increased muscle spasms, Hypomobility, Decreased coordination  Visit Diagnosis: Cramp and spasm  Muscle weakness (generalized)  Unspecified lack of coordination     Problem List Patient Active Problem List   Diagnosis Date Noted   Severe episode of recurrent major depressive disorder, without psychotic features (HCC)    Atrophic vaginitis 12/04/2019   Attention or concentration deficit 12/03/2019   Laryngopharyngeal reflux (LPR) 11/18/2019   Renal insufficiency 04/04/2018   Right hip pain 04/04/2018   Nausea 07/31/2017   Dizziness 07/27/2017   High blood pressure 07/27/2017   Atypical chest pain 07/27/2017   Hyperglycemia 07/27/2017   Abnormal thyroid blood test 02/06/2017   Abnormal liver function test 02/06/2017   Fatigue 10/28/2016   Severe headache 04/12/2015   Weight gain 01/19/2014   Plantar fasciitis, bilateral 03/05/2013   Leg pain, bilateral 03/05/2013   Routine general medical examination at a health care facility 08/03/2011   Palpitations 01/09/2011   Insomnia 12/10/2009   Calculus of gallbladder 05/22/2007   Anxiety and depression 05/22/2007   Psoriasis 04/24/2007    Jule Ser, PT 07/15/2021, 3:35 PM  [facility] @ [place], [place], [address] Phone: [phone]   Fax:  [phone]  Name: Sara West MRN: [id number] Date of Birth: Dec 29, 1965

## 2021-07-15 NOTE — Therapy (Addendum)
[facility] [address] [place], [place], [address] Phone: [phone]   Fax:  [phone]  Physical Therapy Treatment  Patient Details  Name: Sara West MRN: [id number] Date of Birth: [birth year] Referring Provider (PT): Edwin Cap, [place]   Encounter Date: 07/15/2021   PT End of Session - 07/15/21 1451     Visit Number 5    Date for PT Re-Evaluation 09/14/21    Authorization Type BCBS    PT Start Time 1400    PT Stop Time 1446    PT Time Calculation (min) 46 min    Activity Tolerance Patient tolerated treatment well             Past Medical History:  Diagnosis Date   Abnormal liver function test 02/06/2017   Abnormal thyroid blood test 02/06/2017   Anxiety 05/22/2007   Bipolar disorder 05/22/2007   per patient no   Depression    Fatigue 10/28/2016   Leg pain, bilateral 03/05/2013   Plantar fasciitis, bilateral 03/05/2013   Psoriasis 04/24/2007   Weight gain 01/19/2014    Past Surgical History:  Procedure Laterality Date   CESAREAN SECTION     x2 requiring blood transfusion (BTL with 2nd one)   CHOLECYSTECTOMY     heart ablation     times 2   TONSILECTOMY, ADENOIDECTOMY, BILATERAL MYRINGOTOMY AND TUBES      There were no vitals filed for this visit.   Subjective Assessment - 07/15/21 1400     Subjective I had something weird happen.  Got on the [place] 1/2 mile.  Did well.  Did again.  Washing window and in staggered stance position and felt pop/tear medial heel. Tingling since that happened.  Tender heel toward arch.   Feels better today.  I think the tape helped.    Diagnostic tests MRI 02/26/21: Lt partial thickness tear of plantar fascia, split tear of peroneus longus tendon behind malleolus    Currently in Pain? No/denies    Pain Score 0-No pain    Pain Orientation Left                               [facility] Adult PT Treatment/Exercise - 07/15/21 0001       Manual  Therapy   Joint Mobilization grade 3 3x 30 sec each: talocural, subtalar, metatarsal and calcaneal mobs    Soft tissue mobilization gastroc, soleus, plantar fascia    Kinesiotex --   1 strip around heel; 2 strips arch support     Ankle Exercises: Seated   Other Seated Ankle Exercises green band                       PT Short Term Goals - 07/08/21 1635       PT SHORT TERM GOAL #1   Title Pt will be ind with self massage and moisturizing routine    Status Achieved      PT SHORT TERM GOAL #2   Title Pt will be able to understand and use toileting techniques for improved BM.    Status Achieved               PT Long Term Goals - 06/30/21 1047       PT LONG TERM GOAL #1   Title Pt will be ind with advanced HEP for pelvic health and function without pain and leakage  Time 12    Period Weeks    Status New    Target Date 09/14/21      PT LONG TERM GOAL #2   Title Pt will report Marinoff score of 1/3 or less    Time 12    Period Weeks    Status New      PT LONG TERM GOAL #3   Title Pt will be able to demonstrate 3/5 MMT and hold for at least 10 seconds due to improved pelvic floor soft tissue pliability and strength    Time 12    Period Weeks    Status New      PT LONG TERM GOAL #4   Title Pt will report 50% less leakage    Time 12    Status New      PT LONG TERM GOAL #5   Title FOOT GOAL: Pt will achieve at least 4+/5 strength in Lt hip abd and ER for improved tolerance of walking for exercise and proximal control of Lt LE.    Time 8    Period Weeks    Status New    Target Date 08/25/21      Additional Long Term Goals   Additional Long Term Goals Yes      PT LONG TERM GOAL #6   Title FOOT GOAL:  Pt will be ind with advanced HEP and understand how to safely progress activity without exacerbation of Lt heel pain.    Time 8    Period Weeks    Status New    Target Date 08/25/21      PT LONG TERM GOAL #7   Title FOOT GOAL: Pt will improve FOTO  score to at least 68% for improved function of Lt foot.    Baseline 55%    Time 8    Period Weeks    Status New    Target Date 08/25/21      PT LONG TERM GOAL #8   Title Pt will report at least 70% return to desired actiivty level including walking for exercise without exacerbation of Lt heel pain.    Time 8    Period Weeks    Status New    Target Date 08/25/21                Clinical impression:  The patient reports a more medial sided heel pain today after a couple of functional episodes in which she had a tingling sensation and pop.  She has a positive response to supportive taping to arch as well as manual therapy joint mobs and myofascial work.  Added instrument assist manual therapy with focus on medial/plantar soft tissue.  Encouraged gradual but progressive ankle strengthening for improved resiliency as she returns to function.    Next visit:  Partial weight bearing standing ex's, proprioceptive ex;  instrument assisted soft tissue, ankle joint mobs    Patient will benefit from skilled therapeutic intervention in order to improve the following deficits and impairments:     Visit Diagnosis: Cramp and spasm  Pain in left foot  Muscle weakness (generalized)     Problem List Patient Active Problem List   Diagnosis Date Noted   Severe episode of recurrent major depressive disorder, without psychotic features (HCC)    Atrophic vaginitis 12/04/2019   Attention or concentration deficit 12/03/2019   Laryngopharyngeal reflux (LPR) 11/18/2019   Renal insufficiency 04/04/2018   Right hip pain 04/04/2018   Nausea 07/31/2017   Dizziness 07/27/2017  High blood pressure 07/27/2017   Atypical chest pain 07/27/2017   Hyperglycemia 07/27/2017   Abnormal thyroid blood test 02/06/2017   Abnormal liver function test 02/06/2017   Fatigue 10/28/2016   Severe headache 04/12/2015   Weight gain 01/19/2014   Plantar fasciitis, bilateral 03/05/2013   Leg pain, bilateral  03/05/2013   Routine general medical examination at a health care facility 08/03/2011   Palpitations 01/09/2011   Insomnia 12/10/2009   Calculus of gallbladder 05/22/2007   Anxiety and depression 05/22/2007   Psoriasis 04/24/2007   Lavinia Sharps, PT 07/15/21 2:52 PM Phone: [phone] Fax: [phone]  Vivien Presto, PT 07/15/2021, 2:52 PM  [facility] [address] [place], [place], [address] Phone: [phone]   Fax:  [phone]  Name: Sara West MRN: [id number] Date of Birth: 08-May-1966

## 2021-07-15 NOTE — Therapy (Deleted)
[facility] @ [facility] [address] [place], [place], [address] Phone: [phone]   Fax:  [phone]  Physical Therapy Treatment  Patient Details  Name: Sara West MRN: [id number] Date of Birth: April 17, 1966 Referring Provider (PT): Edwin Cap, [place]   Encounter Date: 07/15/2021    Past Medical History:  Diagnosis Date   Abnormal liver function test 02/06/2017   Abnormal thyroid blood test 02/06/2017   Anxiety 05/22/2007   Bipolar disorder 05/22/2007   per patient no   Depression    Fatigue 10/28/2016   Leg pain, bilateral 03/05/2013   Plantar fasciitis, bilateral 03/05/2013   Psoriasis 04/24/2007   Weight gain 01/19/2014    Past Surgical History:  Procedure Laterality Date   CESAREAN SECTION     x2 requiring blood transfusion (BTL with 2nd one)   CHOLECYSTECTOMY     heart ablation     times 2   TONSILECTOMY, ADENOIDECTOMY, BILATERAL MYRINGOTOMY AND TUBES      There were no vitals filed for this visit.   Subjective Assessment - 07/15/21 1400     Subjective I had something weird happen.  Got on the [place] 1/2 mile.  Did well.  Did again.  Washing window and in staggered stance position and felt pop/tear medial heel. Tingling since that happened.  Tender heel toward arch.   Feels better today.  I think the tape helped.    Diagnostic tests MRI 02/26/21: Lt partial thickness tear of plantar fascia, split tear of peroneus longus tendon behind malleolus    Currently in Pain? No/denies    Pain Score 0-No pain    Pain Orientation Left                               [facility] Adult PT Treatment/Exercise - 07/15/21 0001       Manual Therapy   Joint Mobilization grade 3 3x 30 sec each: talocural, subtalar, metatarsal and calcaneal mobs    Soft tissue mobilization gastroc, soleus, plantar fascia    Kinesiotex --   1 strip around heel; 2 strips arch support     Ankle Exercises: Seated   Other Seated Ankle  Exercises green band                       PT Short Term Goals - 07/08/21 1635       PT SHORT TERM GOAL #1   Title Pt will be ind with self massage and moisturizing routine    Status Achieved      PT SHORT TERM GOAL #2   Title Pt will be able to understand and use toileting techniques for improved BM.    Status Achieved               PT Long Term Goals - 06/30/21 1047       PT LONG TERM GOAL #1   Title Pt will be ind with advanced HEP for pelvic health and function without pain and leakage    Time 12    Period Weeks    Status New    Target Date 09/14/21      PT LONG TERM GOAL #2   Title Pt will report Marinoff score of 1/3 or less    Time 12    Period Weeks    Status New      PT LONG TERM GOAL #3   Title Pt  will be able to demonstrate 3/5 MMT and hold for at least 10 seconds due to improved pelvic floor soft tissue pliability and strength    Time 12    Period Weeks    Status New      PT LONG TERM GOAL #4   Title Pt will report 50% less leakage    Time 12    Status New      PT LONG TERM GOAL #5   Title FOOT GOAL: Pt will achieve at least 4+/5 strength in Lt hip abd and ER for improved tolerance of walking for exercise and proximal control of Lt LE.    Time 8    Period Weeks    Status New    Target Date 08/25/21      Additional Long Term Goals   Additional Long Term Goals Yes      PT LONG TERM GOAL #6   Title FOOT GOAL:  Pt will be ind with advanced HEP and understand how to safely progress activity without exacerbation of Lt heel pain.    Time 8    Period Weeks    Status New    Target Date 08/25/21      PT LONG TERM GOAL #7   Title FOOT GOAL: Pt will improve FOTO score to at least 68% for improved function of Lt foot.    Baseline 55%    Time 8    Period Weeks    Status New    Target Date 08/25/21      PT LONG TERM GOAL #8   Title Pt will report at least 70% return to desired actiivty level including walking for exercise without  exacerbation of Lt heel pain.    Time 8    Period Weeks    Status New    Target Date 08/25/21                    Patient will benefit from skilled therapeutic intervention in order to improve the following deficits and impairments:     Visit Diagnosis: Cramp and spasm  Pain in left foot  Muscle weakness (generalized)     Problem List Patient Active Problem List   Diagnosis Date Noted   Severe episode of recurrent major depressive disorder, without psychotic features (HCC)    Atrophic vaginitis 12/04/2019   Attention or concentration deficit 12/03/2019   Laryngopharyngeal reflux (LPR) 11/18/2019   Renal insufficiency 04/04/2018   Right hip pain 04/04/2018   Nausea 07/31/2017   Dizziness 07/27/2017   High blood pressure 07/27/2017   Atypical chest pain 07/27/2017   Hyperglycemia 07/27/2017   Abnormal thyroid blood test 02/06/2017   Abnormal liver function test 02/06/2017   Fatigue 10/28/2016   Severe headache 04/12/2015   Weight gain 01/19/2014   Plantar fasciitis, bilateral 03/05/2013   Leg pain, bilateral 03/05/2013   Routine general medical examination at a health care facility 08/03/2011   Palpitations 01/09/2011   Insomnia 12/10/2009   Calculus of gallbladder 05/22/2007   Anxiety and depression 05/22/2007   Psoriasis 04/24/2007    Vivien Presto, PT 07/15/2021, 2:46 PM  [facility] Out[facility] @ [facility] [address] [place], [place], [address] Phone: [phone]   Fax:  [phone]  Name: Sara West MRN: [id number] Date of Birth: [birth year]

## 2021-07-15 NOTE — Patient Instructions (Signed)
Access Code: [email or web address] URL: [email or web address] Date: 07/15/2021 Prepared by: Dwana Curd  Exercises Seated Calf Towel Stretch - 2 x daily - 7 x weekly - 1 sets - 2 reps - 30 hold Seated Hamstring Stretch - 2 x daily - 7 x weekly - 1 sets - 2 reps - 30 hold Seated Piriformis Stretch - 2 x daily - 7 x weekly - 1 sets - 2 reps - 30 hold Seated Figure 4 Piriformis Stretch - 2 x daily - 7 x weekly - 1 sets - 2 reps - 30 hold Seated Ankle Circles - 2 x daily - 7 x weekly - 1 sets - 20 reps Supine Ankle Dorsiflexion and Plantarflexion AROM - 2 x daily - 7 x weekly - 1 sets - 20 reps Supine Pelvic Floor Stretch - 1 x daily - 7 x weekly - 1 sets - 3 reps - 30 sec hold Standing Hip Flexor Stretch - 3 x daily - 7 x weekly - 1 sets - 2 reps - 30 sec hold Supine Bridge with Resistance Band - 1 x daily - 7 x weekly - 2 sets - 10 reps Hooklying Small March - 1 x daily - 7 x weekly - 10 reps - 2 sets Clam with Resistance - 1 x daily - 7 x weekly - 2 sets - 10 reps Hooklying Shoulder Flexion with Abdominal Bracing and Swiss Ball - 1 x daily - 7 x weekly - 3 sets - 10 reps

## 2021-07-20 DIAGNOSIS — F411 Generalized anxiety disorder: Secondary | ICD-10-CM | POA: Diagnosis not present

## 2021-07-22 ENCOUNTER — Ambulatory Visit: Payer: BC Managed Care – PPO | Admitting: Physical Therapy

## 2021-07-22 ENCOUNTER — Ambulatory Visit: Payer: BC Managed Care – PPO | Attending: Obstetrics & Gynecology | Admitting: Physical Therapy

## 2021-07-22 ENCOUNTER — Encounter: Payer: Self-pay | Admitting: Physical Therapy

## 2021-07-22 ENCOUNTER — Other Ambulatory Visit: Payer: Self-pay

## 2021-07-22 DIAGNOSIS — R252 Cramp and spasm: Secondary | ICD-10-CM | POA: Insufficient documentation

## 2021-07-22 DIAGNOSIS — M79672 Pain in left foot: Secondary | ICD-10-CM | POA: Insufficient documentation

## 2021-07-22 DIAGNOSIS — R279 Unspecified lack of coordination: Secondary | ICD-10-CM | POA: Insufficient documentation

## 2021-07-22 DIAGNOSIS — M6281 Muscle weakness (generalized): Secondary | ICD-10-CM | POA: Insufficient documentation

## 2021-07-22 NOTE — Therapy (Signed)
[facility] Out[facility] @ [facility] [address] [place], [place], [address] Phone: [phone]   Fax:  [phone]  Physical Therapy Treatment  Patient Details  Name: Sara West MRN: [id number] Date of Birth: 1966/06/23 Referring Provider (PT): Edwin Cap, [place]   Encounter Date: 07/22/2021   PT End of Session - 07/22/21 1523     Visit Number 8    Date for PT Re-Evaluation 09/14/21    Authorization Type BCBS    PT Start Time 1445    PT Stop Time 1523    PT Time Calculation (min) 38 min    Activity Tolerance Patient tolerated treatment well    Behavior During Therapy [facility] for tasks assessed/performed             Past Medical History:  Diagnosis Date   Abnormal liver function test 02/06/2017   Abnormal thyroid blood test 02/06/2017   Anxiety 05/22/2007   Bipolar disorder 05/22/2007   per patient no   Depression    Fatigue 10/28/2016   Leg pain, bilateral 03/05/2013   Plantar fasciitis, bilateral 03/05/2013   Psoriasis 04/24/2007   Weight gain 01/19/2014    Past Surgical History:  Procedure Laterality Date   CESAREAN SECTION     x2 requiring blood transfusion (BTL with 2nd one)   CHOLECYSTECTOMY     heart ablation     times 2   TONSILECTOMY, ADENOIDECTOMY, BILATERAL MYRINGOTOMY AND TUBES      There were no vitals filed for this visit.   Subjective Assessment - 07/22/21 1449     Subjective I did well with the exercises that I could do with time I had.  Intercourse wasn't as bad.  No leakage this week but hard to hold when I cough and sneeze    Currently in Pain? No/denies                               [facility] Adult PT Treatment/Exercise - 07/22/21 1513       Neuro Re-ed    Neuro Re-ed Details  biofeedback with all exercises      Lumbar Exercises: Standing   Other Standing Lumbar Exercises kegel in standing - staggered and normal- lean and upright- with focus on resting to baseline between reps       Lumbar Exercises: Supine   Other Supine Lumbar Exercises kegel  with focus on resting to baseline between reps      Lumbar Exercises: Sidelying   Other Sidelying Lumbar Exercises kegel in  sidelying with focus on resting to baseline between reps      Lumbar Exercises: Prone   Other Prone Lumbar Exercises kegel in prone with focus on resting to baseline between reps                       PT Short Term Goals - 07/08/21 1635       PT SHORT TERM GOAL #1   Title Pt will be ind with self massage and moisturizing routine    Status Achieved      PT SHORT TERM GOAL #2   Title Pt will be able to understand and use toileting techniques for improved BM.    Status Achieved               PT Long Term Goals - 07/22/21 1450       PT LONG TERM GOAL #1  Title Pt will be ind with advanced HEP for pelvic health and function without pain and leakage                   Plan - 07/22/21 1524     Clinical Impression Statement Pt did well using biofeedback.  Pt was cued with biofeedback which helped her know which exercises were helping the most and helped her know how to rest in between sets. Pt is still having some leakage with coughing and sneezing.  She will benefit from skilled PT to contiue to progress core and pelvic floor strength    PT Treatment/Interventions ADLs/Self Care Home Management;Aquatic Therapy;Cryotherapy;Moist Heat;Electrical Stimulation;Therapeutic activities;Functional mobility training;Gait training;Neuromuscular re-education;Patient/family education;Taping;Manual techniques;Dry needling;Passive range of motion;Joint Manipulations;Therapeutic exercise;Biofeedback    PT Next Visit Plan f/u on exercises and leakage; core and hip strengthening    PT Home Exercise Plan Access Code: [email or web address]    Consulted and Agree with Plan of Care Patient             Patient will benefit from skilled therapeutic intervention in order to improve the following  deficits and impairments:  Abnormal gait, Decreased range of motion, Increased fascial restricitons, Impaired flexibility, Pain, Improper body mechanics, Decreased strength, Postural dysfunction, Decreased activity tolerance, Increased muscle spasms, Hypomobility, Decreased coordination  Visit Diagnosis: Cramp and spasm  Pain in left foot  Muscle weakness (generalized)  Unspecified lack of coordination     Problem List Patient Active Problem List   Diagnosis Date Noted   Severe episode of recurrent major depressive disorder, without psychotic features (HCC)    Atrophic vaginitis 12/04/2019   Attention or concentration deficit 12/03/2019   Laryngopharyngeal reflux (LPR) 11/18/2019   Renal insufficiency 04/04/2018   Right hip pain 04/04/2018   Nausea 07/31/2017   Dizziness 07/27/2017   High blood pressure 07/27/2017   Atypical chest pain 07/27/2017   Hyperglycemia 07/27/2017   Abnormal thyroid blood test 02/06/2017   Abnormal liver function test 02/06/2017   Fatigue 10/28/2016   Severe headache 04/12/2015   Weight gain 01/19/2014   Plantar fasciitis, bilateral 03/05/2013   Leg pain, bilateral 03/05/2013   Routine general medical examination at a health care facility 08/03/2011   Palpitations 01/09/2011   Insomnia 12/10/2009   Calculus of gallbladder 05/22/2007   Anxiety and depression 05/22/2007   Psoriasis 04/24/2007    Junious Silk, PT 07/22/2021, 3:30 PM  [facility] Out[facility] @ [facility] [address] [place], [place], [address] Phone: [phone]   Fax:  [phone]  Name: Sara West MRN: [id number] Date of Birth: 1965/12/04

## 2021-07-22 NOTE — Therapy (Addendum)
[facility] @ [place], [place], [address] Phone: [phone]   Fax:  [phone]  Physical Therapy Treatment  Patient Details  Name: Sara West MRN: [id number] Date of Birth: November 04, 1965 Referring Provider (PT): Criselda Peaches, [place]   Encounter Date: 07/22/2021   PT End of Session - 07/22/21 1442     Visit Number 7    Date for PT Re-Evaluation 09/14/21    Authorization Type BCBS    PT Start Time 3570    PT Stop Time 1444    PT Time Calculation (min) 41 min    Activity Tolerance Patient tolerated treatment well             Past Medical History:  Diagnosis Date   Abnormal liver function test 02/06/2017   Abnormal thyroid blood test 02/06/2017   Anxiety 05/22/2007   Bipolar disorder 05/22/2007   per patient no   Depression    Fatigue 10/28/2016   Leg pain, bilateral 03/05/2013   Plantar fasciitis, bilateral 03/05/2013   Psoriasis 04/24/2007   Weight gain 01/19/2014    Past Surgical History:  Procedure Laterality Date   CESAREAN SECTION     x2 requiring blood transfusion (BTL with 2nd one)   CHOLECYSTECTOMY     heart ablation     times 2   TONSILECTOMY, ADENOIDECTOMY, BILATERAL MYRINGOTOMY AND TUBES      There were no vitals filed for this visit.   Subjective Assessment - 07/22/21 1400     Subjective It's been a little sore. Medial heel pain.  Getting better but stil there.  Tingling periodically.  Doing ex's some with band, heel raises, big toe stretch    Diagnostic tests MRI 02/26/21: Lt partial thickness tear of plantar fascia, split tear of peroneus longus tendon behind malleolus    Patient Stated Goals return to walking for exercise, yardwork, return to active lifestyle in general    Currently in Pain? Yes    Pain Score 4     Pain Location Foot    Pain Orientation Left                               [facility] Adult PT Treatment/Exercise - 07/22/21 0001       Manual  Therapy   Joint Mobilization grade 3 3x 30 sec each: talocural, subtalar, metatarsal and calcaneal mobs    Soft tissue mobilization gastroc, soleus, plantar fascia   stainless steel instrument assist to plantar fascia, gastroc, posterior tib and peroneals   Kinesiotex --   1 strip around heel; 2 strips arch support     Ankle Exercises: Stretches   Other Stretch doorway psoas stretch 3x 5                       PT Short Term Goals - 07/08/21 1635       PT SHORT TERM GOAL #1   Title Pt will be ind with self massage and moisturizing routine    Status Achieved      PT SHORT TERM GOAL #2   Title Pt will be able to understand and use toileting techniques for improved BM.    Status Achieved               PT Long Term Goals - 07/15/21 1531       PT LONG TERM GOAL #1   Title Pt will  be ind with advanced HEP for pelvic health and function without pain and leakage    Status On-going      PT LONG TERM GOAL #2   Title Pt will report Marinoff score of 1/3 or less    Status Partially Met      PT LONG TERM GOAL #3   Title Pt will be able to demonstrate 3/5 MMT and hold for at least 10 seconds due to improved pelvic floor soft tissue pliability and strength    Status On-going      PT LONG TERM GOAL #4   Title Pt will report 50% less leakage    Status On-going                 The patient reports improving overall pain although still present and localized to medial heel.  Improving ankle joint mobility and gastroc/soleus soft tissue mobility.  Good response to [place] instrument assisted manual therapy but may benefit from DN to quadratus plantae for longer lasting benefit.  Encouraged hip flexor and gastroc doorway stretching and proprioceptive single leg standing exs.    Plan: try DN to quadratus plantae    Patient will benefit from skilled therapeutic intervention in order to improve the following deficits and impairments:     Visit Diagnosis: Cramp and  spasm  Pain in left foot  Muscle weakness (generalized)     Problem List Patient Active Problem List   Diagnosis Date Noted   Severe episode of recurrent major depressive disorder, without psychotic features (HCC)    Atrophic vaginitis 12/04/2019   Attention or concentration deficit 12/03/2019   Laryngopharyngeal reflux (LPR) 11/18/2019   Renal insufficiency 04/04/2018   Right hip pain 04/04/2018   Nausea 07/31/2017   Dizziness 07/27/2017   High blood pressure 07/27/2017   Atypical chest pain 07/27/2017   Hyperglycemia 07/27/2017   Abnormal thyroid blood test 02/06/2017   Abnormal liver function test 02/06/2017   Fatigue 10/28/2016   Severe headache 04/12/2015   Weight gain 01/19/2014   Plantar fasciitis, bilateral 03/05/2013   Leg pain, bilateral 03/05/2013   Routine general medical examination at a health care facility 08/03/2011   Palpitations 01/09/2011   Insomnia 12/10/2009   Calculus of gallbladder 05/22/2007   Anxiety and depression 05/22/2007   Psoriasis 04/24/2007   Ruben Im, PT 07/22/21 2:46 PM Phone: [phone] Fax: [phone]  Alvera Singh, PT 07/22/2021, 2:46 PM  [facility] @ [place], [place], [address] Phone: [phone]   Fax:  [phone]  Name: Sara West MRN: [id number] Date of Birth: 10/15/1965

## 2021-07-29 ENCOUNTER — Encounter: Payer: Self-pay | Admitting: Physical Therapy

## 2021-07-29 ENCOUNTER — Telehealth: Payer: Self-pay | Admitting: Podiatry

## 2021-07-29 ENCOUNTER — Other Ambulatory Visit: Payer: Self-pay

## 2021-07-29 ENCOUNTER — Ambulatory Visit: Payer: BC Managed Care – PPO | Admitting: Physical Therapy

## 2021-07-29 DIAGNOSIS — R279 Unspecified lack of coordination: Secondary | ICD-10-CM | POA: Diagnosis not present

## 2021-07-29 DIAGNOSIS — M79672 Pain in left foot: Secondary | ICD-10-CM | POA: Diagnosis not present

## 2021-07-29 DIAGNOSIS — M6281 Muscle weakness (generalized): Secondary | ICD-10-CM

## 2021-07-29 DIAGNOSIS — R252 Cramp and spasm: Secondary | ICD-10-CM

## 2021-07-29 NOTE — Therapy (Signed)
[facility] @ [facility] [address] [place], [place], [address] Phone: [phone]   Fax:  [phone]  Physical Therapy Treatment  Patient Details  Name: Sara West MRN: [id number] Date of Birth: 06-11-1966 Referring Provider (PT): Edwin Cap, [place]   Encounter Date: 07/29/2021   PT End of Session - 07/29/21 1528     Visit Number 10    Date for PT Re-Evaluation 09/14/21    Authorization Type BCBS    PT Start Time 1445    PT Stop Time 1524    PT Time Calculation (min) 39 min    Activity Tolerance Patient tolerated treatment well    Behavior During Therapy [facility] for tasks assessed/performed             Past Medical History:  Diagnosis Date   Abnormal liver function test 02/06/2017   Abnormal thyroid blood test 02/06/2017   Anxiety 05/22/2007   Bipolar disorder 05/22/2007   per patient no   Depression    Fatigue 10/28/2016   Leg pain, bilateral 03/05/2013   Plantar fasciitis, bilateral 03/05/2013   Psoriasis 04/24/2007   Weight gain 01/19/2014    Past Surgical History:  Procedure Laterality Date   CESAREAN SECTION     x2 requiring blood transfusion (BTL with 2nd one)   CHOLECYSTECTOMY     heart ablation     times 2   TONSILECTOMY, ADENOIDECTOMY, BILATERAL MYRINGOTOMY AND TUBES      There were no vitals filed for this visit.   Subjective Assessment - 07/29/21 1446     Subjective I feel I am getting a little stronger.  I haven't had as much leakage and it seems to only happen when I sneeze.    Currently in Pain? No/denies   denies pain other than take at previous visit for heel pain                           Pelvic Floor Special Questions - 07/29/21 0001     Falling out feeling (prolapse) Yes    Activities that cause feeling of prolapse notices when wiping and more at the end of the day    Strength # of seconds 3               [facility] Adult PT Treatment/Exercise - 07/29/21 0001        Lumbar Exercises: Supine   Bridge with Ball Squeeze Limitations bridge with hip rotation - 20x    Other Supine Lumbar Exercises kegel with focus on holding 3 sec      Lumbar Exercises: Quadruped   Opposite Arm/Leg Raise Right arm/Left leg;Left arm/Right leg;5 reps      Manual Therapy   Manual Therapy Joint mobilization    Joint Mobilization lower ribcage and thoracic mobs    Soft tissue mobilization diaphragm release            thoracic side bend and rotation with diaphragmatic breathing - 3 x each way         PT Education - 07/29/21 1523     Education Details Access Code: [email or web address]    Person(s) Educated Patient    Methods Explanation;Demonstration;Tactile cues;Verbal cues;Handout    Comprehension Returned demonstration;Verbalized understanding              PT Short Term Goals - 07/08/21 1635       PT SHORT TERM GOAL #1   Title Pt will be ind  with self massage and moisturizing routine    Status Achieved      PT SHORT TERM GOAL #2   Title Pt will be able to understand and use toileting techniques for improved BM.    Status Achieved               PT Long Term Goals - 07/29/21 1528       PT LONG TERM GOAL #1   Title Pt will be ind with advanced HEP for pelvic health and function without pain and leakage    Status On-going      PT LONG TERM GOAL #2   Title Pt will report Marinoff score of 1/3 or less    Status On-going      PT LONG TERM GOAL #3   Title Pt will be able to demonstrate 3/5 MMT and hold for at least 10 seconds due to improved pelvic floor soft tissue pliability and strength    Status On-going                   Plan - 07/29/21 1524     Clinical Impression Statement Pt had tension in the diaphragm and ribcage restrictions release with MFR and joint mobs in sitting and supine.  Pt had improved rotation and side bending to the right at the end of treatemnt. Pt did much better with relaxing after doing a kegel.  Pt was able to  sustain kegel for 3 seconds and is making good progress towards goals. Recommended to continue with POC    PT Treatment/Interventions ADLs/Self Care Home Management;Aquatic Therapy;Cryotherapy;Moist Heat;Electrical Stimulation;Therapeutic activities;Functional mobility training;Gait training;Neuromuscular re-education;Patient/family education;Taping;Manual techniques;Dry needling;Passive range of motion;Joint Manipulations;Therapeutic exercise;Biofeedback    PT Next Visit Plan f/u on thoracic mobility; core oblique strengthening, increased endurance    PT Home Exercise Plan Access Code: [email or web address]    Consulted and Agree with Plan of Care Patient             Patient will benefit from skilled therapeutic intervention in order to improve the following deficits and impairments:  Abnormal gait, Decreased range of motion, Increased fascial restricitons, Impaired flexibility, Pain, Improper body mechanics, Decreased strength, Postural dysfunction, Decreased activity tolerance, Increased muscle spasms, Hypomobility, Decreased coordination  Visit Diagnosis: Cramp and spasm  Pain in left foot  Muscle weakness (generalized)  Unspecified lack of coordination     Problem List Patient Active Problem List   Diagnosis Date Noted   Severe episode of recurrent major depressive disorder, without psychotic features (HCC)    Atrophic vaginitis 12/04/2019   Attention or concentration deficit 12/03/2019   Laryngopharyngeal reflux (LPR) 11/18/2019   Renal insufficiency 04/04/2018   Right hip pain 04/04/2018   Nausea 07/31/2017   Dizziness 07/27/2017   High blood pressure 07/27/2017   Atypical chest pain 07/27/2017   Hyperglycemia 07/27/2017   Abnormal thyroid blood test 02/06/2017   Abnormal liver function test 02/06/2017   Fatigue 10/28/2016   Severe headache 04/12/2015   Weight gain 01/19/2014   Plantar fasciitis, bilateral 03/05/2013   Leg pain, bilateral 03/05/2013   Routine general medical  examination at a health care facility 08/03/2011   Palpitations 01/09/2011   Insomnia 12/10/2009   Calculus of gallbladder 05/22/2007   Anxiety and depression 05/22/2007   Psoriasis 04/24/2007    Junious Silk, PT 07/29/2021, 3:31 PM  [facility] Out[facility] @ [facility] [address] [place], [place], [address] Phone: [phone]   Fax:  [phone]  Name: Sara  SHAUNTEL West MRN: [id number] Date of Birth: 11-20-1965

## 2021-07-29 NOTE — Patient Instructions (Signed)
Access Code: [email or web address] URL: [email or web address] Date: 07/29/2021 Prepared by: Dwana Curd  Exercises Seated Calf Towel Stretch - 2 x daily - 7 x weekly - 1 sets - 2 reps - 30 hold Seated Hamstring Stretch - 2 x daily - 7 x weekly - 1 sets - 2 reps - 30 hold Seated Piriformis Stretch - 2 x daily - 7 x weekly - 1 sets - 2 reps - 30 hold Seated Figure 4 Piriformis Stretch - 2 x daily - 7 x weekly - 1 sets - 2 reps - 30 hold Seated Ankle Circles - 2 x daily - 7 x weekly - 1 sets - 20 reps Supine Ankle Dorsiflexion and Plantarflexion AROM - 2 x daily - 7 x weekly - 1 sets - 20 reps Supine Pelvic Floor Stretch - 1 x daily - 7 x weekly - 1 sets - 3 reps - 30 sec hold Standing Hip Flexor Stretch - 3 x daily - 7 x weekly - 1 sets - 2 reps - 30 sec hold Supine Bridge with Resistance Band - 1 x daily - 7 x weekly - 2 sets - 10 reps Hooklying Small March - 1 x daily - 7 x weekly - 2 sets - 10 reps Clam with Resistance - 1 x daily - 7 x weekly - 2 sets - 10 reps Hooklying Shoulder Flexion with Abdominal Bracing and Swiss Ball - 1 x daily - 7 x weekly - 3 sets - 10 reps Table Lean - 3 x daily - 7 x weekly - 1 sets - 10 reps Supine Bridge with Pelvic Floor Contraction and Hip Rotation - 2 x daily - 7 x weekly - 1 sets - 10 reps Seated Sidebending - 1 x daily - 7 x weekly - 3 sets - 10 reps Seated Thoracic Flexion and Rotation with Arms Crossed - 1 x daily - 7 x weekly - 10 reps - 1 sets - 5 sec hold

## 2021-07-29 NOTE — Patient Instructions (Signed)

## 2021-07-29 NOTE — Therapy (Addendum)
[facility] @ [facility] [address] [place], [place], [address] Phone: [phone]   Fax:  [phone]  Physical Therapy Treatment  Patient Details  Name: Sara West MRN: [id number] Date of Birth: 1966-02-28 Referring Provider (PT): Edwin Cap, [place]   Encounter Date: 07/29/2021   PT End of Session - 07/29/21 1359     Visit Number 9    Date for PT Re-Evaluation 09/14/21    Authorization Type BCBS    PT Start Time 1400    PT Stop Time 1440    PT Time Calculation (min) 40 min    Activity Tolerance Patient tolerated treatment well             Past Medical History:  Diagnosis Date   Abnormal liver function test 02/06/2017   Abnormal thyroid blood test 02/06/2017   Anxiety 05/22/2007   Bipolar disorder 05/22/2007   per patient no   Depression    Fatigue 10/28/2016   Leg pain, bilateral 03/05/2013   Plantar fasciitis, bilateral 03/05/2013   Psoriasis 04/24/2007   Weight gain 01/19/2014    Past Surgical History:  Procedure Laterality Date   CESAREAN SECTION     x2 requiring blood transfusion (BTL with 2nd one)   CHOLECYSTECTOMY     heart ablation     times 2   TONSILECTOMY, ADENOIDECTOMY, BILATERAL MYRINGOTOMY AND TUBES      There were no vitals filed for this visit.   Subjective Assessment - 07/29/21 1402     Subjective It's OK, A little sore.  Feels like some sharper pain on medial heel.  More pain since working more and on feet more.  B/c of work less compliant with home ex.    Diagnostic tests MRI 02/26/21: Lt partial thickness tear of plantar fascia, split tear of peroneus longus tendon behind malleolus    Currently in Pain? Yes    Pain Score 6    8 or 9   Pain Location Heel    Pain Orientation Left                 Ankle foot joint mobs: grade 3/4 talocural AP and lateral calcaneal mobs, metatarsal mobs Great toe extension stretch  Soft tissue mobilization to gastroc and soleus muscles as well as  quadratus plantae muscle  Kinesiotaping to  low dye method for fascial support                 Trigger Point Dry Needling - 07/29/21 0001     Consent Given? Yes    Muscles Treated Lower Quadrant Quadratus plantae    Quadatus plantae response Twitch response elicited;Palpable increased muscle length                   PT Education - 07/29/21 1442     Education Details dry needling after care    Person(s) Educated Patient    Methods Explanation;Handout    Comprehension Verbalized understanding              PT Short Term Goals - 07/08/21 1635       PT SHORT TERM GOAL #1   Title Pt will be ind with self massage and moisturizing routine    Status Achieved      PT SHORT TERM GOAL #2   Title Pt will be able to understand and use toileting techniques for improved BM.    Status Achieved  PT Long Term Goals - 07/22/21 1450       PT LONG TERM GOAL #1   Title Pt will be ind with advanced HEP for pelvic health and function without pain and leakage              The patient has an increase in pain this week due to increased time on her feet at work.  In addition to soft tissue mobilization, the patient is receptive to trial of dry needling to quadratus plantae muscle.  She is highly sensitive to treatment in this area.  Discussed after care and management of soreness.  Encouraged regular compliance with HEP.  Next visit:  assess response to DN of QP; manual techniques; Taping (may try strapping tape instead of KT); ex progression       Patient will benefit from skilled therapeutic intervention in order to improve the following deficits and impairments:     Visit Diagnosis: Cramp and spasm  Pain in left foot  Muscle weakness (generalized)     Problem List Patient Active Problem List   Diagnosis Date Noted   Severe episode of recurrent major depressive disorder, without psychotic features (HCC)    Atrophic vaginitis  12/04/2019   Attention or concentration deficit 12/03/2019   Laryngopharyngeal reflux (LPR) 11/18/2019   Renal insufficiency 04/04/2018   Right hip pain 04/04/2018   Nausea 07/31/2017   Dizziness 07/27/2017   High blood pressure 07/27/2017   Atypical chest pain 07/27/2017   Hyperglycemia 07/27/2017   Abnormal thyroid blood test 02/06/2017   Abnormal liver function test 02/06/2017   Fatigue 10/28/2016   Severe headache 04/12/2015   Weight gain 01/19/2014   Plantar fasciitis, bilateral 03/05/2013   Leg pain, bilateral 03/05/2013   Routine general medical examination at a health care facility 08/03/2011   Palpitations 01/09/2011   Insomnia 12/10/2009   Calculus of gallbladder 05/22/2007   Anxiety and depression 05/22/2007   Psoriasis 04/24/2007   Ruben Im, PT 07/29/21 2:44 PM Phone: [phone] Fax: [phone]  Alvera Singh, PT 07/29/2021, 2:44 PM  [facility] @ [place], [place], [address] Phone: [phone]   Fax:  [phone]  Name: AIDYNN POPOLIZIO MRN: [id number] Date of Birth: 08-Jan-1966

## 2021-07-29 NOTE — Telephone Encounter (Signed)
Pt left message asking if her orthotics were in yet.  I returned call and told her they are not in yet and I would call when they come in. She said thank you

## 2021-07-31 ENCOUNTER — Other Ambulatory Visit: Payer: Self-pay | Admitting: Family Medicine

## 2021-08-02 ENCOUNTER — Telehealth: Payer: Self-pay | Admitting: Podiatry

## 2021-08-02 NOTE — Telephone Encounter (Signed)
2nd pr orthotics in.. pt aware ok to pick up. °

## 2021-08-03 DIAGNOSIS — F411 Generalized anxiety disorder: Secondary | ICD-10-CM | POA: Diagnosis not present

## 2021-08-05 ENCOUNTER — Other Ambulatory Visit: Payer: Self-pay

## 2021-08-05 ENCOUNTER — Ambulatory Visit: Payer: BC Managed Care – PPO | Admitting: Physical Therapy

## 2021-08-05 DIAGNOSIS — M6281 Muscle weakness (generalized): Secondary | ICD-10-CM | POA: Diagnosis not present

## 2021-08-05 DIAGNOSIS — R252 Cramp and spasm: Secondary | ICD-10-CM | POA: Diagnosis not present

## 2021-08-05 DIAGNOSIS — R279 Unspecified lack of coordination: Secondary | ICD-10-CM | POA: Diagnosis not present

## 2021-08-05 DIAGNOSIS — M79672 Pain in left foot: Secondary | ICD-10-CM

## 2021-08-05 NOTE — Therapy (Signed)
[facility] @ [place], [place], [address] Phone: [phone]   Fax:  [phone]  Physical Therapy Treatment  Patient Details  Name: Sara West MRN: [id number] Date of Birth: 29-Sep-1965 Referring Provider (PT): Criselda Peaches, [place]   Encounter Date: 08/05/2021   PT End of Session - 08/05/21 1510     Visit Number 12    Date for PT Re-Evaluation 09/14/21    Authorization Type BCBS    PT Start Time 1450    PT Stop Time 1528    PT Time Calculation (min) 38 min    Activity Tolerance Patient tolerated treatment well    Behavior During Therapy [facility] for tasks assessed/performed             Past Medical History:  Diagnosis Date   Abnormal liver function test 02/06/2017   Abnormal thyroid blood test 02/06/2017   Anxiety 05/22/2007   Bipolar disorder 05/22/2007   per patient no   Depression    Fatigue 10/28/2016   Leg pain, bilateral 03/05/2013   Plantar fasciitis, bilateral 03/05/2013   Psoriasis 04/24/2007   Weight gain 01/19/2014    Past Surgical History:  Procedure Laterality Date   CESAREAN SECTION     x2 requiring blood transfusion (BTL with 2nd one)   CHOLECYSTECTOMY     heart ablation     times 2   TONSILECTOMY, ADENOIDECTOMY, BILATERAL MYRINGOTOMY AND TUBES      There were no vitals filed for this visit.   Subjective Assessment - 08/05/21 1507     Subjective The exercises have been feeling good.  No pain other than previously mentioned with other PT.    Currently in Pain? No/denies                               [facility] Adult PT Treatment/Exercise - 08/05/21 1517       Lumbar Exercises: Stretches   Other Lumbar Stretch Exercise side bend and breathing into ribcage      Lumbar Exercises: Aerobic   UBE (Upper Arm Bike) L1 3 fwd,3 back      Lumbar Exercises: Standing   Other Standing Lumbar Exercises isometric core - band series - red band - 30x each way      Lumbar  Exercises: Supine   Bridge with Ball Squeeze Limitations bridge with hip rotation - 20x    Other Supine Lumbar Exercises kegel with focus on holding 3 sec      Manual Therapy   Manual Therapy Joint mobilization    Joint Mobilization lower ribcage and thoracic mobs    Soft tissue mobilization diaphragm release                       PT Short Term Goals - 07/08/21 1635       PT SHORT TERM GOAL #1   Title Pt will be ind with self massage and moisturizing routine    Status Achieved      PT SHORT TERM GOAL #2   Title Pt will be able to understand and use toileting techniques for improved BM.    Status Achieved               PT Long Term Goals - 08/05/21 1733       PT LONG TERM GOAL #1   Title Pt will be ind with advanced HEP for pelvic health  and function without pain and leakage    Status On-going      PT LONG TERM GOAL #2   Title Pt will report Marinoff score of 1/3 or less    Baseline not reported this week    Status On-going      PT LONG TERM GOAL #3   Title Pt will be able to demonstrate 3/5 MMT and hold for at least 10 seconds due to improved pelvic floor soft tissue pliability and strength    Baseline building up to 5 seconds    Status Partially Met      PT LONG TERM GOAL #4   Title Pt will report 50% less leakage    Status Partially Met                   Plan - 08/05/21 1730     Clinical Impression Statement Pt did well with exercise progression and working on engaging the abdominal muscles along with pelvic floor.  Pt needs cues to lengthen low back and engage her core.  Pt was given updates to HEP to work on these progressions. Continue to work on impairment per plan of care.    PT Treatment/Interventions ADLs/Self Care Home Management;Aquatic Therapy;Cryotherapy;Moist Heat;Electrical Stimulation;Therapeutic activities;Functional mobility training;Gait training;Neuromuscular re-education;Patient/family education;Taping;Manual  techniques;Dry needling;Passive range of motion;Joint Manipulations;Therapeutic exercise;Biofeedback    PT Next Visit Plan f/u on new exercses - band in standing, start on UBE again, core exercises maybe on vibration plate?    PT Home Exercise Plan Access Code: [email or web address]    [email or web address] and Agree with Plan of Care Patient             Patient will benefit from skilled therapeutic intervention in order to improve the following deficits and impairments:  Abnormal gait, Decreased range of motion, Increased fascial restricitons, Impaired flexibility, Pain, Improper body mechanics, Decreased strength, Postural dysfunction, Decreased activity tolerance, Increased muscle spasms, Hypomobility, Decreased coordination  Visit Diagnosis: Cramp and spasm  Muscle weakness (generalized)  Unspecified lack of coordination     Problem List Patient Active Problem List   Diagnosis Date Noted   Severe episode of recurrent major depressive disorder, without psychotic features (HCC)    Atrophic vaginitis 12/04/2019   Attention or concentration deficit 12/03/2019   Laryngopharyngeal reflux (LPR) 11/18/2019   Renal insufficiency 04/04/2018   Right hip pain 04/04/2018   Nausea 07/31/2017   Dizziness 07/27/2017   High blood pressure 07/27/2017   Atypical chest pain 07/27/2017   Hyperglycemia 07/27/2017   Abnormal thyroid blood test 02/06/2017   Abnormal liver function test 02/06/2017   Fatigue 10/28/2016   Severe headache 04/12/2015   Weight gain 01/19/2014   Plantar fasciitis, bilateral 03/05/2013   Leg pain, bilateral 03/05/2013   Routine general medical examination at a health care facility 08/03/2011   Palpitations 01/09/2011   Insomnia 12/10/2009   Calculus of gallbladder 05/22/2007   Anxiety and depression 05/22/2007   Psoriasis 04/24/2007    Sara West, PT 08/05/2021, 5:34 PM  [facility] @ [place], [place],  [address] Phone: [phone]   Fax:  [phone]  Name: Sara West MRN: [id number] Date of Birth: [birth year]

## 2021-08-05 NOTE — Therapy (Addendum)
[facility] @ [facility] [address] [place], [place], [address] Phone: [phone]   Fax:  [phone]  Physical Therapy Treatment  Patient Details  Name: Sara West MRN: [id number] Date of Birth: Nov 20, 1965 Referring Provider (PT): Edwin Cap, [place]   Encounter Date: 08/05/2021   PT End of Session - 08/05/21 1404     Visit Number 11    Date for PT Re-Evaluation 09/14/21    Authorization Type BCBS    PT Start Time 1605    PT Stop Time 1645    PT Time Calculation (min) 40 min             Past Medical History:  Diagnosis Date   Abnormal liver function test 02/06/2017   Abnormal thyroid blood test 02/06/2017   Anxiety 05/22/2007   Bipolar disorder 05/22/2007   per patient no   Depression    Fatigue 10/28/2016   Leg pain, bilateral 03/05/2013   Plantar fasciitis, bilateral 03/05/2013   Psoriasis 04/24/2007   Weight gain 01/19/2014    Past Surgical History:  Procedure Laterality Date   CESAREAN SECTION     x2 requiring blood transfusion (BTL with 2nd one)   CHOLECYSTECTOMY     heart ablation     times 2   TONSILECTOMY, ADENOIDECTOMY, BILATERAL MYRINGOTOMY AND TUBES      There were no vitals filed for this visit.   Subjective Assessment - 08/05/21 1405     Subjective The DN and tape really helped.  It's been better week. I have a hard time doing ex's at home, busy with helping kids and work.    Limitations Walking    Patient Stated Goals return to walking for exercise, yardwork, return to active lifestyle in general    Currently in Pain? Yes    Pain Score 3     Pain Location Heel                               [facility] Adult PT Treatment/Exercise - 08/05/21 0001       Manual Therapy   Manual Therapy Joint mobilization    Joint Mobilization Calcaneal and talocrual joint mobs grade 3/4 3x 30 sec;  metatarsal mobs, great toe stretch    Soft tissue mobilization Addaday to gastroc/soleus;  Graston instrument assisted to plantar fascia and posterior tib           Encouraged regular compliance with HEP including gastroc stretching. Instructed in posterior tibialis strengthening off edge of book with small ball squeeze concurrently. Discussed benefit of hip strengthening as well.    Kinesiotaping 5 strips Low Dye method           PT Short Term Goals - 07/08/21 1635       PT SHORT TERM GOAL #1   Title Pt will be ind with self massage and moisturizing routine    Status Achieved      PT SHORT TERM GOAL #2   Title Pt will be able to understand and use toileting techniques for improved BM.    Status Achieved               PT Long Term Goals - 07/29/21 1528       PT LONG TERM GOAL #1   Title Pt will be ind with advanced HEP for pelvic health and function without pain and leakage    Status On-going  PT LONG TERM GOAL #2   Title Pt will report Marinoff score of 1/3 or less    Status On-going      PT LONG TERM GOAL #3   Title Pt will be able to demonstrate 3/5 MMT and hold for at least 10 seconds due to improved pelvic floor soft tissue pliability and strength    Status On-going              Clinical impression: Although the DN last visit was intense, she reports benefit with improved pain levels this week.  Since her pain level is rated low, we decided not to do DN today but she is open to it in the future if needed.  Improved soft tissue mobility noted overall today with fewer tender points.  She reports limited time for self care and home exercise.  Will continue to select ex's that can be done intermittently during life tasks.   Plan:  DN quadratus plantae as needed;  try banded toe side stepping; taping, manual therapy       Patient will benefit from skilled therapeutic intervention in order to improve the following deficits and impairments:     Visit Diagnosis: Cramp and spasm  Pain in left foot  Muscle weakness  (generalized)     Problem List Patient Active Problem List   Diagnosis Date Noted   Severe episode of recurrent major depressive disorder, without psychotic features (HCC)    Atrophic vaginitis 12/04/2019   Attention or concentration deficit 12/03/2019   Laryngopharyngeal reflux (LPR) 11/18/2019   Renal insufficiency 04/04/2018   Right hip pain 04/04/2018   Nausea 07/31/2017   Dizziness 07/27/2017   High blood pressure 07/27/2017   Atypical chest pain 07/27/2017   Hyperglycemia 07/27/2017   Abnormal thyroid blood test 02/06/2017   Abnormal liver function test 02/06/2017   Fatigue 10/28/2016   Severe headache 04/12/2015   Weight gain 01/19/2014   Plantar fasciitis, bilateral 03/05/2013   Leg pain, bilateral 03/05/2013   Routine general medical examination at a health care facility 08/03/2011   Palpitations 01/09/2011   Insomnia 12/10/2009   Calculus of gallbladder 05/22/2007   Anxiety and depression 05/22/2007   Psoriasis 04/24/2007   Ruben Im, PT 08/05/21 2:48 PM Phone: [phone] Fax: [phone]  Alvera Singh, PT 08/05/2021, 2:48 PM  [facility] @ [place], [place], [address] Phone: [phone]   Fax:  [phone]  Name: Sara West MRN: [id number] Date of Birth: [birth year]

## 2021-08-10 DIAGNOSIS — F331 Major depressive disorder, recurrent, moderate: Secondary | ICD-10-CM | POA: Diagnosis not present

## 2021-08-17 ENCOUNTER — Encounter: Payer: Self-pay | Admitting: Physical Therapy

## 2021-08-17 ENCOUNTER — Encounter: Payer: BC Managed Care – PPO | Admitting: Physical Therapy

## 2021-08-17 DIAGNOSIS — F411 Generalized anxiety disorder: Secondary | ICD-10-CM | POA: Diagnosis not present

## 2021-08-18 ENCOUNTER — Other Ambulatory Visit: Payer: Self-pay | Admitting: Family Medicine

## 2021-08-18 MED ORDER — AMPHETAMINE-DEXTROAMPHETAMINE 10 MG PO TABS: 10.0000 mg | ORAL_TABLET | Freq: Every day | ORAL | 0 refills | Status: DC

## 2021-08-18 NOTE — Telephone Encounter (Signed)
Requesting: Adderall 10mg   Contract: 12/03/2019 UDS: 07/05/2021 Last Visit: 07/05/2021 Next Visit: None Last Refill: 07/02/2021 #30 and 0RF  Please Advise

## 2021-08-19 ENCOUNTER — Encounter: Payer: Self-pay | Admitting: Podiatry

## 2021-08-19 DIAGNOSIS — M79672 Pain in left foot: Secondary | ICD-10-CM

## 2021-08-19 DIAGNOSIS — F331 Major depressive disorder, recurrent, moderate: Secondary | ICD-10-CM | POA: Diagnosis not present

## 2021-08-19 DIAGNOSIS — M629 Disorder of muscle, unspecified: Secondary | ICD-10-CM

## 2021-08-21 NOTE — Telephone Encounter (Signed)
Spoke with patient and she is still having quite a bit of pain and has improved but is not 100%.  She has been doing physical therapy for nearly 2 months now.  This injury originally occurred in June of this year.  I am ordering another MRI to evaluate the healing of the tear and to guide further treatment options.

## 2021-08-26 ENCOUNTER — Other Ambulatory Visit: Payer: Self-pay

## 2021-08-26 ENCOUNTER — Encounter: Payer: Self-pay | Admitting: Physical Therapy

## 2021-08-26 ENCOUNTER — Ambulatory Visit: Payer: BC Managed Care – PPO | Admitting: Physical Therapy

## 2021-08-26 DIAGNOSIS — R252 Cramp and spasm: Secondary | ICD-10-CM

## 2021-08-26 DIAGNOSIS — M6281 Muscle weakness (generalized): Secondary | ICD-10-CM

## 2021-08-26 DIAGNOSIS — M79672 Pain in left foot: Secondary | ICD-10-CM

## 2021-08-26 DIAGNOSIS — R279 Unspecified lack of coordination: Secondary | ICD-10-CM

## 2021-08-26 NOTE — Therapy (Signed)
[facility] @ [facility] [address] [place], [place], [address] Phone: [phone]   Fax:  [phone]  Physical Therapy Treatment  Patient Details  Name: Sara West MRN: [id number] Date of Birth: 1965/12/16 Referring Provider (PT): Edwin Cap, [place]   Encounter Date: 08/26/2021   PT End of Session - 08/26/21 1609     Visit Number 13    Date for PT Re-Evaluation 09/14/21    Authorization Type BCBS    PT Start Time 1403    PT Stop Time 1445    PT Time Calculation (min) 42 min    Activity Tolerance Patient tolerated treatment well    Behavior During Therapy [facility] for tasks assessed/performed             Past Medical History:  Diagnosis Date   Abnormal liver function test 02/06/2017   Abnormal thyroid blood test 02/06/2017   Anxiety 05/22/2007   Bipolar disorder 05/22/2007   per patient no   Depression    Fatigue 10/28/2016   Leg pain, bilateral 03/05/2013   Plantar fasciitis, bilateral 03/05/2013   Psoriasis 04/24/2007   Weight gain 01/19/2014    Past Surgical History:  Procedure Laterality Date   CESAREAN SECTION     x2 requiring blood transfusion (BTL with 2nd one)   CHOLECYSTECTOMY     heart ablation     times 2   TONSILECTOMY, ADENOIDECTOMY, BILATERAL MYRINGOTOMY AND TUBES      There were no vitals filed for this visit.   Subjective Assessment - 08/26/21 1613     Subjective My abdomen feels very bloated and uncomfortable.  I haven't been able to be intimate with my husband because my daughter has been really sick. It is very stressful    Patient Stated Goals return to walking for exercise, yardwork, return to active lifestyle in general    Currently in Pain? No/denies                               [facility] Adult PT Treatment/Exercise - 08/26/21 0001       Lumbar Exercises: Stretches   Active Hamstring Stretch Right;Left;60 seconds    Active Hamstring Stretch Limitations on vibration  plate    Hip Flexor Stretch Right;Left;1 rep;60 seconds    Hip Flexor Stretch Limitations on vibration plate    Gastroc Stretch Limitations seating on floor with calf on vibration plate    Other Lumbar Stretch Exercise heel toe raises on vibration plate for plantar stretch      Lumbar Exercises: Standing   Other Standing Lumbar Exercises hip hingeing with correct posture, cues to flatten back and get stretch throughout posterior LEs - 15 reps      Manual Therapy   Manual Therapy Myofascial release    Myofascial Release liver and stomach, ascending and descending colon releases in order to improve engage core                     PT Education - 08/26/21 1637     Education Details [email or web address] added hip hinge    Person(s) Educated Patient    Methods Explanation;Demonstration;Verbal cues;Tactile cues;Handout    Comprehension Verbalized understanding;Returned demonstration              PT Short Term Goals - 07/08/21 1635       PT SHORT TERM GOAL #1   Title Pt will be ind with self  massage and moisturizing routine    Status Achieved      PT SHORT TERM GOAL #2   Title Pt will be able to understand and use toileting techniques for improved BM.    Status Achieved               PT Long Term Goals - 08/26/21 1408       PT LONG TERM GOAL #1   Title Pt will be ind with advanced HEP for pelvic health and function without pain and leakage    Status On-going      PT LONG TERM GOAL #2   Title Pt will report Marinoff score of 1/3 or less    Baseline haven't had time this week    Status On-going      PT LONG TERM GOAL #3   Title Pt will be able to demonstrate 3/5 MMT and hold for at least 10 seconds due to improved pelvic floor soft tissue pliability and strength      PT LONG TERM GOAL #4   Title Pt will report 50% less leakage    Baseline No leakage in 3 weeks    Status Achieved                   Plan - 08/26/21 1430     Clinical Impression  Statement Pt continues to respond well to abdominal fascial release to reduce restrections around the stomach and intestinal areas.  Pt was under a lot of stress and having more abdominal pain.  Pt was able to work on core and did well with vibration plate on lower setting and getting a good stretch when stretching hamstrings and calf on the plate.  The stretching also helped her feel less discomfort in the foot/ankle at the end of the session today    Comorbidities 2 c-sections, plantarfascia tear Lt foot; menopaus    PT Treatment/Interventions ADLs/Self Care Home Management;Aquatic Therapy;Cryotherapy;Moist Heat;Electrical Stimulation;Therapeutic activities;Functional mobility training;Gait training;Neuromuscular re-education;Patient/family education;Taping;Manual techniques;Dry needling;Passive range of motion;Joint Manipulations;Therapeutic exercise;Biofeedback    PT Next Visit Plan f/u on new exercses - band in standing, start on UBE again, core exercises,LE stretches on vibration plate    PT Home Exercise Plan Access Code: [email or web address]    Consulted and Agree with Plan of Care Patient             Patient will benefit from skilled therapeutic intervention in order to improve the following deficits and impairments:  Abnormal gait, Decreased range of motion, Increased fascial restricitons, Impaired flexibility, Pain, Improper body mechanics, Decreased strength, Postural dysfunction, Decreased activity tolerance, Increased muscle spasms, Hypomobility, Decreased coordination  Visit Diagnosis: Cramp and spasm  Pain in left foot  Muscle weakness (generalized)  Unspecified lack of coordination     Problem List Patient Active Problem List   Diagnosis Date Noted   Severe episode of recurrent major depressive disorder, without psychotic features (HCC)    Atrophic vaginitis 12/04/2019   Attention or concentration deficit 12/03/2019   Laryngopharyngeal reflux (LPR) 11/18/2019   Renal  insufficiency 04/04/2018   Right hip pain 04/04/2018   Nausea 07/31/2017   Dizziness 07/27/2017   High blood pressure 07/27/2017   Atypical chest pain 07/27/2017   Hyperglycemia 07/27/2017   Abnormal thyroid blood test 02/06/2017   Abnormal liver function test 02/06/2017   Fatigue 10/28/2016   Severe headache 04/12/2015   Weight gain 01/19/2014   Plantar fasciitis, bilateral 03/05/2013   Leg pain, bilateral 03/05/2013   Routine general medical  examination at a health care facility 08/03/2011   Palpitations 01/09/2011   Insomnia 12/10/2009   Calculus of gallbladder 05/22/2007   Anxiety and depression 05/22/2007   Psoriasis 04/24/2007    Junious Silk, PT 08/26/2021, 4:38 PM  [facility] @ [facility] [address] [place], [place], [address] Phone: [phone]   Fax:  [phone]  Name: SHRAVYA WICKWIRE MRN: [id number] Date of Birth: 05/30/1966

## 2021-08-31 DIAGNOSIS — F411 Generalized anxiety disorder: Secondary | ICD-10-CM | POA: Diagnosis not present

## 2021-08-31 DIAGNOSIS — F331 Major depressive disorder, recurrent, moderate: Secondary | ICD-10-CM | POA: Diagnosis not present

## 2021-09-02 ENCOUNTER — Ambulatory Visit: Payer: BC Managed Care – PPO | Attending: Podiatry | Admitting: Physical Therapy

## 2021-09-02 ENCOUNTER — Other Ambulatory Visit: Payer: Self-pay

## 2021-09-02 DIAGNOSIS — R252 Cramp and spasm: Secondary | ICD-10-CM | POA: Diagnosis not present

## 2021-09-02 DIAGNOSIS — M79672 Pain in left foot: Secondary | ICD-10-CM | POA: Diagnosis not present

## 2021-09-02 NOTE — Therapy (Signed)
[facility] @ [facility] [address] [place], [place], [address] Phone: [phone]   Fax:  [phone]  Physical Therapy Treatment  Patient Details  Name: Sara West MRN: [id number] Date of Birth: April 11, 1966 Referring Provider (PT): Edwin Cap, [place]   Encounter Date: 09/02/2021   PT End of Session - 09/02/21 1534     Visit Number 14    Date for PT Re-Evaluation 09/14/21    Authorization Type BCBS    PT Start Time 1402    PT Stop Time 1446    PT Time Calculation (min) 44 min    Activity Tolerance Patient tolerated treatment well             Past Medical History:  Diagnosis Date   Abnormal liver function test 02/06/2017   Abnormal thyroid blood test 02/06/2017   Anxiety 05/22/2007   Bipolar disorder 05/22/2007   per patient no   Depression    Fatigue 10/28/2016   Leg pain, bilateral 03/05/2013   Plantar fasciitis, bilateral 03/05/2013   Psoriasis 04/24/2007   Weight gain 01/19/2014    Past Surgical History:  Procedure Laterality Date   CESAREAN SECTION     x2 requiring blood transfusion (BTL with 2nd one)   CHOLECYSTECTOMY     heart ablation     times 2   TONSILECTOMY, ADENOIDECTOMY, BILATERAL MYRINGOTOMY AND TUBES      There were no vitals filed for this visit.   Subjective Assessment - 09/02/21 1401     Subjective I'm going on the 21st to get another MRI on foot.  It still hurts.   That one spot hurts (medial bottom on heel).    Diagnostic tests MRI 02/26/21: Lt partial thickness tear of plantar fascia, split tear of peroneus longus tendon behind malleolus    Patient Stated Goals return to walking for exercise, yardwork, return to active lifestyle in general    Currently in Pain? Yes    Pain Score 4     Pain Location Heel    Pain Orientation Left                               [facility] Adult PT Treatment/Exercise - 09/02/21 0001       Self-Care   Other Self-Care Comments  discussion  on self management strategies including minfulness, aerobic ex (like stationary bike) for weight management, yoga for stretching and attention to LE strengthening      Manual Therapy   Joint Mobilization ankle talocrual, calcaneal, metatarsal mobsgrade 4 30 sec 3x each    Soft tissue mobilization instrument assisted Graston to gastroc, soleus and plantar fascia      Kinesiotix   Facilitate Muscle  KT plantar taping around heel, 3 strips on plantar fascia and 1 strip dorsal foot                       PT Short Term Goals - 07/08/21 1635       PT SHORT TERM GOAL #1   Title Pt will be ind with self massage and moisturizing routine    Status Achieved      PT SHORT TERM GOAL #2   Title Pt will be able to understand and use toileting techniques for improved BM.    Status Achieved               PT Long Term Goals - 08/26/21  1408       PT LONG TERM GOAL #1   Title Pt will be ind with advanced HEP for pelvic health and function without pain and leakage    Status On-going      PT LONG TERM GOAL #2   Title Pt will report Marinoff score of 1/3 or less    Baseline haven't had time this week    Status On-going      PT LONG TERM GOAL #3   Title Pt will be able to demonstrate 3/5 MMT and hold for at least 10 seconds due to improved pelvic floor soft tissue pliability and strength      PT LONG TERM GOAL #4   Title Pt will report 50% less leakage    Baseline No leakage in 3 weeks    Status Achieved                   Plan - 09/02/21 1534     Clinical Impression Statement She reports continued medial heel pain but states she has found manual therapy myofascial work and taping to be temporarily helpful.  We discussed the effects of stress on pain and how that can exacerbate physical symptoms.  We discussed self care strategies including breathing and relaxation tools that might address that aspect.  With the end of the year, she may need to discontinue PT after  next visit secondary to a high copay and we discussed exercise areas of focus.  Overall joint and soft tissue mobility much improved since start of care.  Tender point in medial plantar fascia improved following manual therapy.    Comorbidities 2 c-sections, plantarfascia tear Lt foot; menopaus    Examination-Activity Limitations Locomotion Level;Squat    Examination-Participation Restrictions Community Activity;Yard Work    PT Frequency 1x / week    PT Duration 8 weeks    PT Treatment/Interventions ADLs/Self Care Home Management;Aquatic Therapy;Cryotherapy;Moist Heat;Electrical Stimulation;Therapeutic activities;Functional mobility training;Gait training;Neuromuscular re-education;Patient/family education;Taping;Manual techniques;Dry needling;Passive range of motion;Joint Manipulations;Therapeutic exercise;Biofeedback    PT Next Visit Plan f/u on new exercses - band in standing, start on UBE again, core exercises,LE stretches on vibration plate;  review progress toward goals and determine readiness for discharge;  do foot FOTO    PT Home Exercise Plan Access Code: [email or web address]             Patient will benefit from skilled therapeutic intervention in order to improve the following deficits and impairments:  Abnormal gait, Decreased range of motion, Increased fascial restricitons, Impaired flexibility, Pain, Improper body mechanics, Decreased strength, Postural dysfunction, Decreased activity tolerance, Increased muscle spasms, Hypomobility, Decreased coordination  Visit Diagnosis: Pain in left foot  Cramp and spasm     Problem List Patient Active Problem List   Diagnosis Date Noted   Severe episode of recurrent major depressive disorder, without psychotic features (HCC)    Atrophic vaginitis 12/04/2019   Attention or concentration deficit 12/03/2019   Laryngopharyngeal reflux (LPR) 11/18/2019   Renal insufficiency 04/04/2018   Right hip pain 04/04/2018   Nausea 07/31/2017   Dizziness  07/27/2017   High blood pressure 07/27/2017   Atypical chest pain 07/27/2017   Hyperglycemia 07/27/2017   Abnormal thyroid blood test 02/06/2017   Abnormal liver function test 02/06/2017   Fatigue 10/28/2016   Severe headache 04/12/2015   Weight gain 01/19/2014   Plantar fasciitis, bilateral 03/05/2013   Leg pain, bilateral 03/05/2013   Routine general medical examination at a health care facility 08/03/2011   Palpitations 01/09/2011  Insomnia 12/10/2009   Calculus of gallbladder 05/22/2007   Anxiety and depression 05/22/2007   Psoriasis 04/24/2007   Lavinia Sharps, PT 09/02/21 3:42 PM Phone: [phone] Fax: [phone]  Vivien Presto, PT 09/02/2021, 3:42 PM  [facility] @ [facility] [address] [place], [place], [address] Phone: [phone]   Fax:  [phone]  Name: ASHANA TULLO MRN: [id number] Date of Birth: 16-Jun-1966

## 2021-09-08 ENCOUNTER — Ambulatory Visit
Admission: RE | Admit: 2021-09-08 | Discharge: 2021-09-08 | Disposition: A | Discharge status: Home / Self Care | Payer: BC Managed Care – PPO | Source: Ambulatory Visit | Attending: Podiatry | Admitting: Podiatry

## 2021-09-08 ENCOUNTER — Other Ambulatory Visit: Payer: Self-pay

## 2021-09-08 DIAGNOSIS — M7732 Calcaneal spur, left foot: Secondary | ICD-10-CM | POA: Diagnosis not present

## 2021-09-08 DIAGNOSIS — M722 Plantar fascial fibromatosis: Secondary | ICD-10-CM | POA: Diagnosis not present

## 2021-09-08 DIAGNOSIS — G8929 Other chronic pain: Secondary | ICD-10-CM | POA: Diagnosis not present

## 2021-09-08 DIAGNOSIS — M629 Disorder of muscle, unspecified: Secondary | ICD-10-CM

## 2021-09-08 IMAGING — MR MR HEEL *L* W/O CM
5 series · 40 of 40 positions shown · non-contrast
Comparison: MRI [DATE].  No prior radiographs available.

CLINICAL DATA: Chronic heel pain. Plantar fasciitis suspected.
History of plantar fasciitis with low-grade partial thickness tear
at the calcaneal enthesis.

EXAM:
MR OF THE LEFT HEEL WITHOUT CONTRAST
TECHNIQUE: Multiplanar, multisequence MR imaging of the left hindfoot was
performed. No intravenous contrast was administered.

[Series 5: PD fat-sat · axial · 3.0mm · 0.62mm/px · z∈[-126,-5]mm · 9 of 32 slices shown]
[im 1/32]
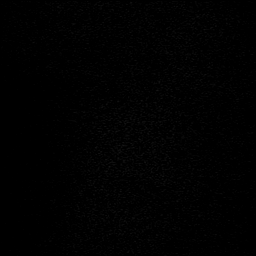
[im 4/32]
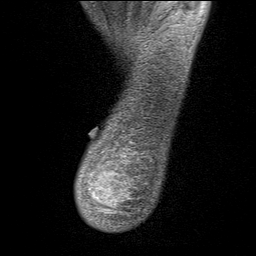
[im 8/32]
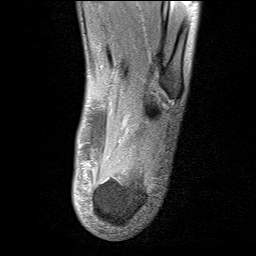
[im 12/32]
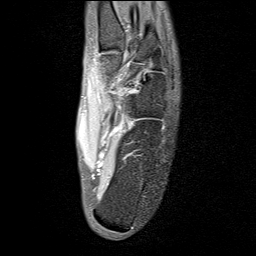
[im 16/32]
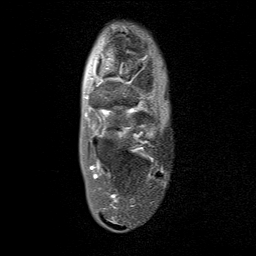
[im 20/32]
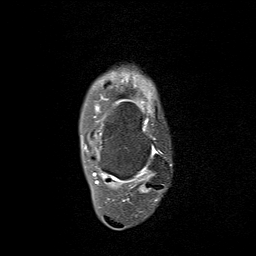
[im 24/32]
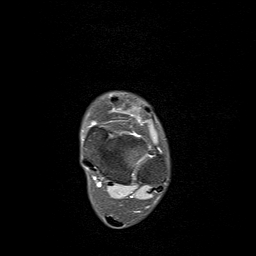
[im 28/32]
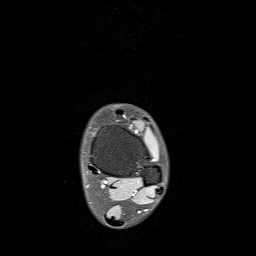
[im 32/32]
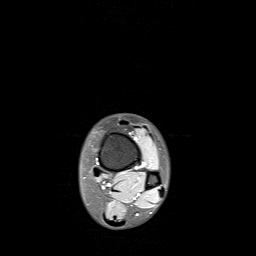

[Series 6: T1 · sagittal · 4.0mm · 0.56mm/px · 6 of 20 slices shown]
[im 1/20]
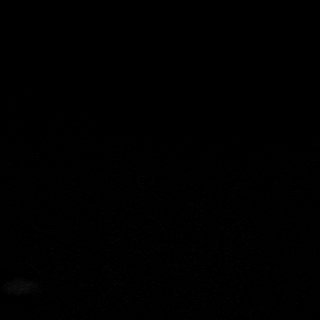
[im 4/20]
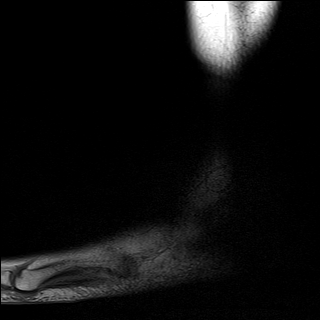
[im 8/20]
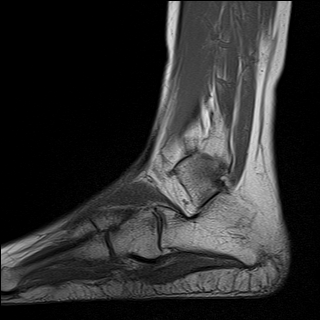
[im 12/20]
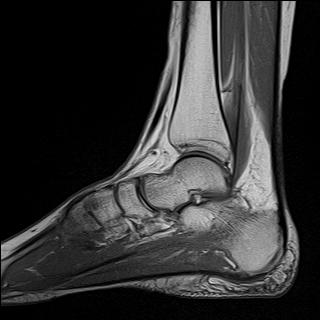
[im 16/20]
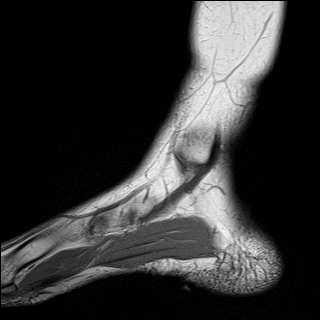
[im 20/20]
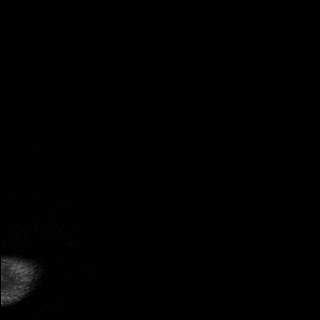

[Series 7: STIR · sagittal · 4.0mm · 0.35mm/px · 6 of 20 slices shown]
[im 1/20]
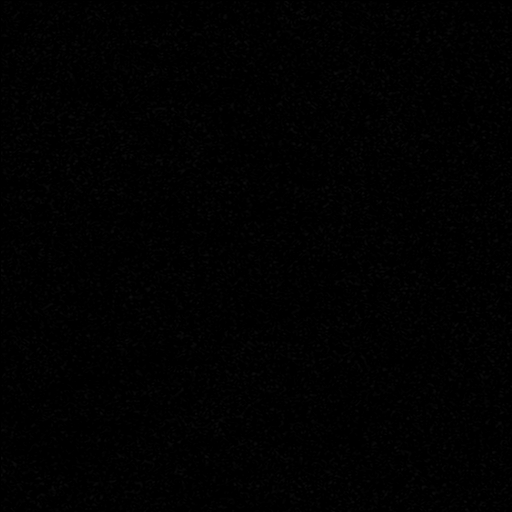
[im 4/20]
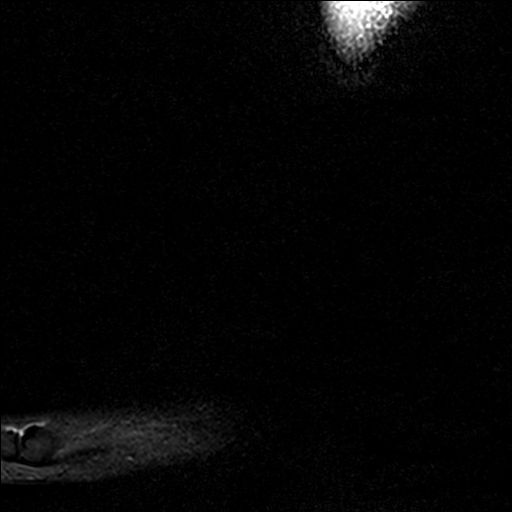
[im 8/20]
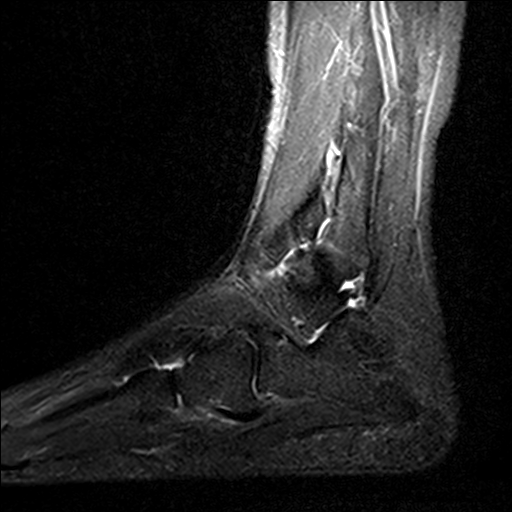
[im 12/20]
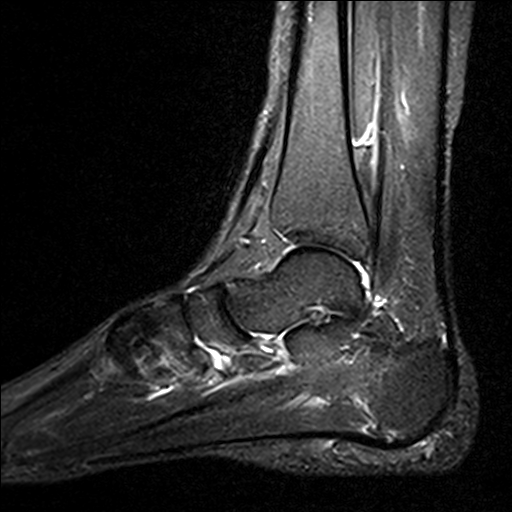
[im 16/20]
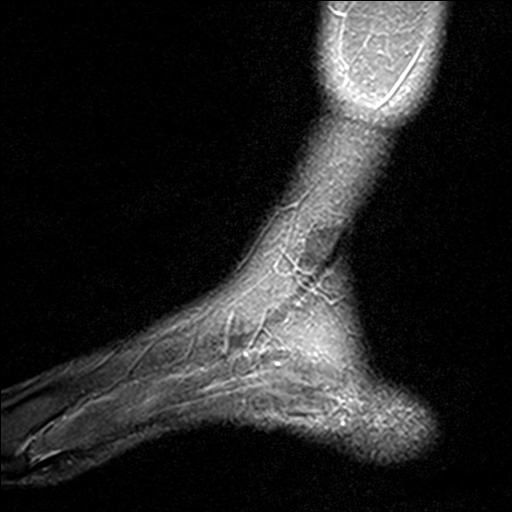
[im 20/20]
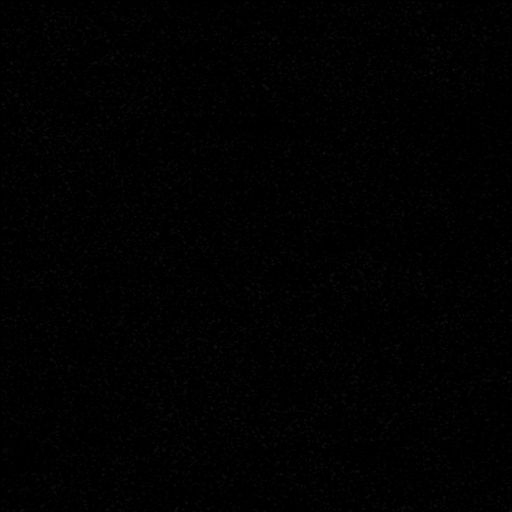

[Series 8: T2 fat-sat · coronal · 3.0mm · 0.62mm/px · 10 of 35 slices shown (1 of 2)]
[im 1/35]
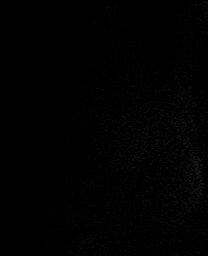
[im 4/35]
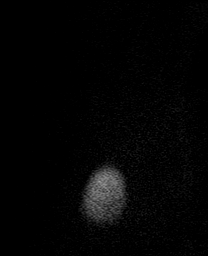
[im 8/35]
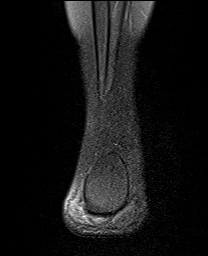
[im 12/35]
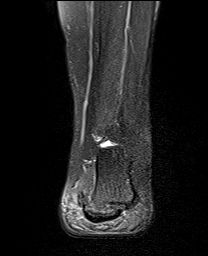
[im 16/35]
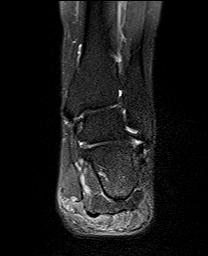
[im 19/35]
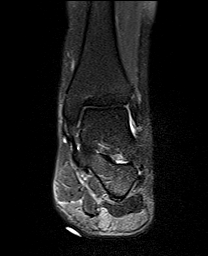
[im 23/35]
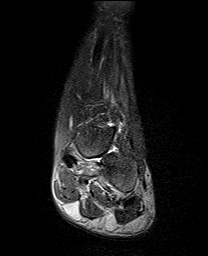
[im 27/35]
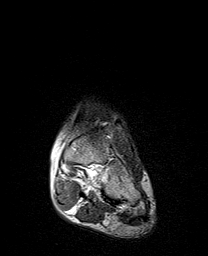
[im 31/35]
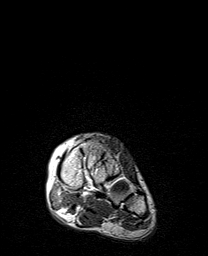
[im 35/35]
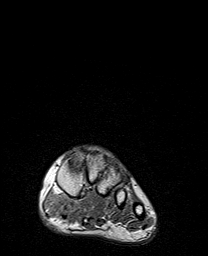

[Series 9: T2 fat-sat · axial · 3.0mm · 0.62mm/px · z∈[-126,-5]mm · 9 of 32 slices shown (2 of 2)]
[im 1/32]
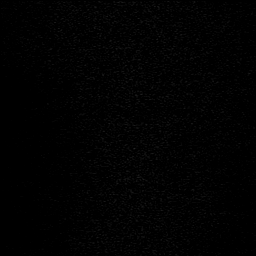
[im 4/32]
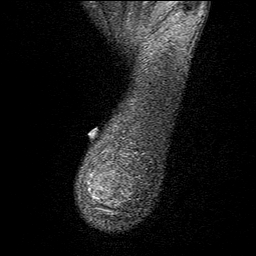
[im 8/32]
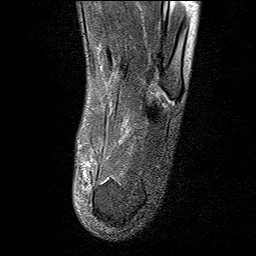
[im 12/32]
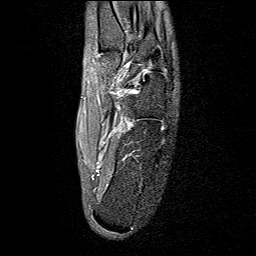
[im 16/32]
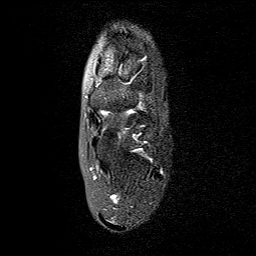
[im 20/32]
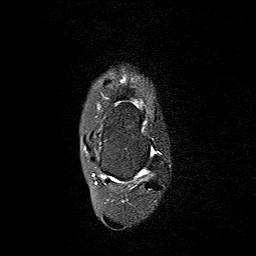
[im 24/32]
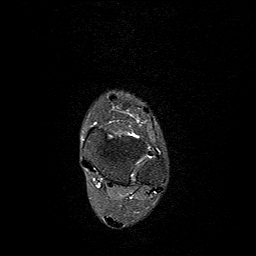
[im 28/32]
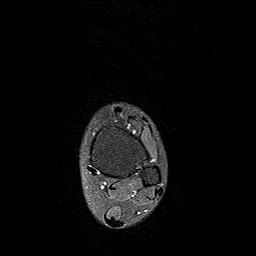
[im 32/32]
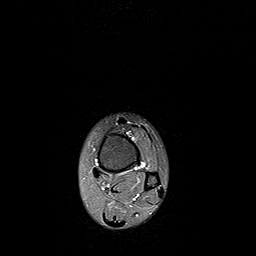

[40 of 40 positions shown; findings below may reference images not displayed]

FINDINGS: TENDONS

Peroneal: Mild intrasubstance signal within the peroneus longus
tendon at the level of the distal fibula. No discrete longitudinal
split tear identified on the current examination. The peroneus
brevis tendon is intact. No significant sheath fluid.

Posteromedial: Intact and normally positioned.

Anterior: Intact and normally positioned.

Achilles: Intact.

Plantar Fascia: Previously demonstrated T2 hyperintensity within the
central cord of the plantar fascia and surrounding edema have
improved. Small plantar calcaneal spur without significant reactive
edema.

LIGAMENTS

Lateral: The anterior and posterior talofibular and calcaneofibular
ligaments are intact.The inferior tibiofibular ligaments appear
intact.

Medial: The deltoid and visualized portions of the spring ligament
appear intact.

CARTILAGE AND BONES

Ankle Joint: No significant ankle joint effusion. The talar dome and
tibial plafond are intact.

Subtalar Joints/Sinus Tarsi: Unremarkable.

Bones: No significant extra-articular osseous findings.

Other: No significant soft tissue findings.
IMPRESSION: 1. The previously demonstrated findings of plantar fasciitis have
improved. No evidence of fascial rupture.
2. Mild peroneus longus tendinosis without evidence of tear.
3. No new or progressive findings identified.

## 2021-09-09 ENCOUNTER — Encounter: Payer: Self-pay | Admitting: Physical Therapy

## 2021-09-09 ENCOUNTER — Ambulatory Visit: Payer: BC Managed Care – PPO | Admitting: Physical Therapy

## 2021-09-09 DIAGNOSIS — R279 Unspecified lack of coordination: Secondary | ICD-10-CM

## 2021-09-09 DIAGNOSIS — M6281 Muscle weakness (generalized): Secondary | ICD-10-CM

## 2021-09-09 DIAGNOSIS — R252 Cramp and spasm: Secondary | ICD-10-CM

## 2021-09-09 DIAGNOSIS — M79672 Pain in left foot: Secondary | ICD-10-CM

## 2021-09-09 NOTE — Therapy (Signed)
[facility] @ [facility] [place], [place], [address] Phone: [phone]   Fax:  [phone]  Physical Therapy Treatment/Discharge Summary   Patient Details  Name: Sara West MRN: [id number] Date of Birth: 10-13-1965 Referring Provider (PT): Criselda Peaches, [place]   Encounter Date: 09/09/2021   PT End of Session - 09/09/21 1541     Visit Number 16    Date for PT Re-Evaluation 09/14/21    Authorization Type BCBS    PT Start Time 1445    PT Stop Time 1525    PT Time Calculation (min) 40 min    Activity Tolerance Patient tolerated treatment well             Past Medical History:  Diagnosis Date   Abnormal liver function test 02/06/2017   Abnormal thyroid blood test 02/06/2017   Anxiety 05/22/2007   Bipolar disorder 05/22/2007   per patient no   Depression    Fatigue 10/28/2016   Leg pain, bilateral 03/05/2013   Plantar fasciitis, bilateral 03/05/2013   Psoriasis 04/24/2007   Weight gain 01/19/2014    Past Surgical History:  Procedure Laterality Date   CESAREAN SECTION     x2 requiring blood transfusion (BTL with 2nd one)   CHOLECYSTECTOMY     heart ablation     times 2   TONSILECTOMY, ADENOIDECTOMY, BILATERAL MYRINGOTOMY AND TUBES      There were no vitals filed for this visit.   Subjective Assessment - 09/09/21 1447     Subjective MRI yesterday waiting on results.  I know I need to do the ex's.  [facility]'m going to [facility]join [facility].    Diagnostic tests MRI 02/26/21: Lt partial thickness tear of plantar fascia, split tear of peroneus longus tendon behind malleolus    Patient Stated Goals return to walking for exercise, yardwork, return to active lifestyle in general    Currently in Pain? Yes    Pain Location Heel    Pain Orientation Left                [facility] PT Assessment - 09/09/21 0001       Observation/Other Assessments   Focus on Therapeutic Outcomes (FOTO)  67%      AROM   Left Ankle Dorsiflexion 8     Left Ankle Plantar Flexion 40      Strength   Overall Strength Comments ankle/foot strength grossly 4+/5    Left Hip External Rotation 4+/5    Left Hip Internal Rotation 4+/5    Left Hip ABduction 4+/5                           [facility] Adult PT Treatment/Exercise - 09/09/21 0001       Lumbar Exercises: Stretches   Active Hamstring Stretch Right;Left;60 seconds    Active Hamstring Stretch Limitations on vibration plate    Other Lumbar Stretch Exercise vibration stretch calf 2x60      Manual Therapy   Joint Mobilization ankle talocrual, calcaneal, metatarsal mobsgrade 4 30 sec 3x each    Soft tissue mobilization gastroc/soleus; plantarfascia      Ankle Exercises: Seated   Other Seated Ankle Exercises discussion of ball plantar fascial massage with ball    Other Seated Ankle Exercises discussion of HEP                       PT Short Term Goals -  07/08/21 1635       PT SHORT TERM GOAL #1   Title Pt will be ind with self massage and moisturizing routine    Status Achieved      PT SHORT TERM GOAL #2   Title Pt will be able to understand and use toileting techniques for improved BM.    Status Achieved               PT Long Term Goals - 09/09/21 1512       PT LONG TERM GOAL #5   Status Achieved      PT LONG TERM GOAL #6   Title FOOT GOAL:  Pt will be ind with advanced HEP and understand how to safely progress activity without exacerbation of Lt heel pain.      PT LONG TERM GOAL #7   Title FOOT GOAL: Pt will improve FOTO score to at least 68% for improved function of Lt foot.    Status Partially Met      PT LONG TERM GOAL #8   Title Pt will report at least 70% return to desired actiivty level including walking for exercise without exacerbation of Lt heel pain.    Baseline 80%    Status Achieved                   Plan - 09/09/21 1541     Clinical Impression Statement The patient reports an overall 80% improvement in  heel/foot pain.  Improved and now symmetrical ankle ROM.  Ankle/foot strength grossly 4+/5.  Her FOTO score has improved from 55% to 67%.  She has been instructed in a HEP and reports a good understanding of continued performance.  She plans to [facility]join [facility] and [facility].  Will discharge from PT with the majority of goals met.    Comorbidities 2 c-sections, plantarfascia tear Lt foot; menopaus             Patient will benefit from skilled therapeutic intervention in order to improve the following deficits and impairments:     Visit Diagnosis: Pain in left foot  Cramp and spasm  PHYSICAL THERAPY DISCHARGE SUMMARY  Visits from Start of Care: 16  Current functional level related to goals / functional outcomes: See clinical impressions above   Remaining deficits: As above   Education / Equipment: HEP   Patient agrees to discharge. Patient goals were met. Patient is being discharged due to meeting the stated rehab goals.    Problem List Patient Active Problem List   Diagnosis Date Noted   Severe episode of recurrent major depressive disorder, without psychotic features (HCC)    Atrophic vaginitis 12/04/2019   Attention or concentration deficit 12/03/2019   Laryngopharyngeal reflux (LPR) 11/18/2019   Renal insufficiency 04/04/2018   Right hip pain 04/04/2018   Nausea 07/31/2017   Dizziness 07/27/2017   High blood pressure 07/27/2017   Atypical chest pain 07/27/2017   Hyperglycemia 07/27/2017   Abnormal thyroid blood test 02/06/2017   Abnormal liver function test 02/06/2017   Fatigue 10/28/2016   Severe headache 04/12/2015   Weight gain 01/19/2014   Plantar fasciitis, bilateral 03/05/2013   Leg pain, bilateral 03/05/2013   Routine general medical examination at a health care facility 08/03/2011   Palpitations 01/09/2011   Insomnia 12/10/2009   Calculus of gallbladder 05/22/2007   Anxiety and depression 05/22/2007   Psoriasis 04/24/2007   Ruben Im,  PT 09/09/21 3:47 PM Phone: [phone] Fax: [phone]   Ruben Im  C, PT 09/09/2021, 3:46 PM  [facility] @ [facility] [address] [place], [place], [address] Phone: [phone]   Fax:  [phone]  Name: MATTISYN CARDONA MRN: [id number] Date of Birth: Jun 12, 1966

## 2021-09-09 NOTE — Therapy (Addendum)
[facility] [address] [place], [place], [address] Phone: [phone]   Fax:  [phone]  Physical Therapy Treatment  Patient Details  Name: Sara West MRN: [id number] Date of Birth: 05-04-1966 Referring Provider (PT): Criselda Peaches, [place]   Encounter Date: 09/09/2021   PT End of Session - 09/09/21 1446     Visit Number 15    Date for PT Re-Evaluation 09/14/21    Authorization Type BC[employer]    PT Start Time 1405    PT Stop Time 1445    PT Time Calculation (min) 40 min    Activity Tolerance Patient tolerated treatment well    Behavior During Therapy [facility] for tasks assessed/performed             Past Medical History:  Diagnosis Date   Abnormal liver function test 02/06/2017   Abnormal thyroid blood test 02/06/2017   Anxiety 05/22/2007   Bipolar disorder 05/22/2007   per patient no   Depression    Fatigue 10/28/2016   Leg pain, bilateral 03/05/2013   Plantar fasciitis, bilateral 03/05/2013   Psoriasis 04/24/2007   Weight gain 01/19/2014    Past Surgical History:  Procedure Laterality Date   CESAREAN SECTION     x2 requiring blood transfusion (BTL with 2nd one)   CHOLECYSTECTOMY     heart ablation     times 2   TONSILECTOMY, ADENOIDECTOMY, BILATERAL MYRINGOTOMY AND TUBES      There were no vitals filed for this visit.   Subjective Assessment - 09/09/21 1407     Subjective I am doing well.                               [facility] PT Treatment/Exercise - 09/14/21 0001       Self-Care   Other Self-Care Comments  discussion and education on using dilators and reviewing HEP      Exercises   Exercises Other Exercises    Other Exercises  hip hinges bil and single leg; core strength with band series      Manual Therapy   Myofascial Release liver and stomach, ascending and descending colon releases in order to improve engage core                          PT Long  Term Goals - 09/09/21 1512       PT LONG TERM GOAL #5   Status Achieved      PT LONG TERM GOAL #6   Title FOOT GOAL:  Pt will be ind with advanced HEP and understand how to safely progress activity without exacerbation of Lt heel pain.      PT LONG TERM GOAL #7   Title FOOT GOAL: Pt will improve FOTO score to at least 68% for improved function of Lt foot.    Status Partially Met      PT LONG TERM GOAL #8   Title Pt will report at least 70% return to desired actiivty level including walking for exercise without exacerbation of Lt heel pain.    Baseline 80%    Status Achieved                   Plan - 09/14/21 1053     Clinical Impression Statement Today's session focused on HEP.  Most goals have been met and pt is no longer having  leakage.  Pt still having pain with intercourse and knows how to continue to work on this at home.  D/c today with HEP is recommended    PT Treatment/Interventions ADLs/Self Care Home Management;Aquatic Therapy;Cryotherapy;Moist Heat;Electrical Stimulation;Therapeutic activities;Functional mobility training;Gait training;Neuromuscular re-education;Patient/family education;Taping;Manual techniques;Dry needling;Passive range of motion;Joint Manipulations;Therapeutic exercise;Biofeedback    PT Next Visit Plan d/c    PT Home Exercise Plan Access Code: [email or web address]    SMMOCAREQ and Agree with Plan of Care Patient             Patient will benefit from skilled therapeutic intervention in order to improve the following deficits and impairments:  Abnormal gait, Decreased range of motion, Increased fascial restricitons, Impaired flexibility, Pain, Improper body mechanics, Decreased strength, Postural dysfunction, Decreased activity tolerance, Increased muscle spasms, Hypomobility, Decreased coordination  Visit Diagnosis: Muscle weakness (generalized)  Unspecified lack of coordination     Problem List Patient Active Problem List   Diagnosis Date Noted    Severe episode of recurrent major depressive disorder, without psychotic features (HCC)    Atrophic vaginitis 12/04/2019   Attention or concentration deficit 12/03/2019   Laryngopharyngeal reflux (LPR) 11/18/2019   Renal insufficiency 04/04/2018   Right hip pain 04/04/2018   Nausea 07/31/2017   Dizziness 07/27/2017   High blood pressure 07/27/2017   Atypical chest pain 07/27/2017   Hyperglycemia 07/27/2017   Abnormal thyroid blood test 02/06/2017   Abnormal liver function test 02/06/2017   Fatigue 10/28/2016   Severe headache 04/12/2015   Weight gain 01/19/2014   Plantar fasciitis, bilateral 03/05/2013   Leg pain, bilateral 03/05/2013   Routine general medical examination at a health care facility 08/03/2011   Palpitations 01/09/2011   Insomnia 12/10/2009   Calculus of gallbladder 05/22/2007   Anxiety and depression 05/22/2007   Psoriasis 04/24/2007    Camillo Flaming Kenzi Bardwell, PT 09/14/2021, 10:58 AM  [facility] @ [address] [place], [place], [address] Phone: [phone]   Fax:  [phone]  Name: Sara West MRN: [id number] Date of Birth: [birth year]   PHYSICAL THERAPY DISCHARGE SUMMARY  Visits from Start of Care: 16 (pelvic and ankle)  Current functional level related to goals / functional outcomes: See above goals   Remaining deficits: See above   Education / Equipment: HEP  Patient agrees to discharge. Patient goals were partially met. Patient is being discharged due to being pleased with the current functional level.  Gustavus Bryant, PT 09/14/21 10:59 AM

## 2021-09-16 ENCOUNTER — Other Ambulatory Visit: Payer: Self-pay | Admitting: Family Medicine

## 2021-09-17 MED ORDER — HYDROXYZINE PAMOATE 25 MG PO CAPS: 25.0000 mg | ORAL_CAPSULE | Freq: Every evening | ORAL | 1 refills | Status: DC

## 2021-09-17 MED ORDER — AMPHETAMINE-DEXTROAMPHETAMINE 10 MG PO TABS: 10.0000 mg | ORAL_TABLET | Freq: Every day | ORAL | 0 refills | Status: DC

## 2021-09-17 MED ORDER — ALPRAZOLAM 0.25 MG PO TABS: 0.2500 mg | ORAL_TABLET | Freq: Three times a day (TID) | ORAL | 1 refills | Status: DC | PRN

## 2021-09-17 NOTE — Telephone Encounter (Signed)
Patient is requesting a refill of the following medications: Requested Prescriptions   Pending Prescriptions Disp Refills   hydrOXYzine (VISTARIL) 25 MG capsule 30 capsule     Sig: Take 1 capsule (25 mg total) by mouth every evening.   ALPRAZolam (XANAX) 0.25 MG tablet 90 tablet 0   amphetamine-dextroamphetamine (ADDERALL) 10 MG tablet 30 tablet 0    Sig: Take 1 tablet (10 mg total) by mouth daily. September 2022    Date of patient request: 09/17/21 Last office visit: 07/05/21 Follow up time period per chart: No Follow up scheduled.  Hydroxyzine  Date of last refill: we never filled for pt Last refill amount: historical   XANAX Date of last refill: 06/28/21 Last refill amount: 90 + 0  ADDERALL Date of last refill: 08/18/21 Last refill amount: 30 + 0

## 2021-09-30 ENCOUNTER — Other Ambulatory Visit: Payer: Self-pay | Admitting: Family Medicine

## 2021-10-11 ENCOUNTER — Other Ambulatory Visit: Payer: Self-pay | Admitting: Family Medicine

## 2021-10-24 ENCOUNTER — Other Ambulatory Visit: Payer: Self-pay | Admitting: Family Medicine

## 2021-10-25 MED ORDER — AMPHETAMINE-DEXTROAMPHETAMINE 10 MG PO TABS: 10.0000 mg | ORAL_TABLET | Freq: Every day | ORAL | 0 refills | Status: DC

## 2021-10-25 NOTE — Telephone Encounter (Signed)
Requesting: Adderall 10mg   Contract: 12/03/2019 UDS: 07/05/2021 Last Visit: 07/05/2021 Next Visit: None Last Refill: 09/17/2021 #30 and 0RF  Please Advise

## 2021-11-28 ENCOUNTER — Other Ambulatory Visit: Payer: Self-pay | Admitting: Family Medicine

## 2021-11-29 MED ORDER — AMPHETAMINE-DEXTROAMPHETAMINE 10 MG PO TABS: 10.0000 mg | ORAL_TABLET | Freq: Every day | ORAL | 0 refills | Status: DC

## 2021-11-29 NOTE — Telephone Encounter (Signed)
Requesting: Adderall 10mg   ?Contract: 12/03/2019 ?UDS: 07/05/2021 ?Last Visit: 07/05/2021 ?Next Visit: None ?Last Refill: 10/25/2021 #30 and 0RF ? ?Please Advise ? ?

## 2022-01-05 ENCOUNTER — Other Ambulatory Visit: Payer: Self-pay

## 2022-01-05 ENCOUNTER — Other Ambulatory Visit: Payer: Self-pay | Admitting: Family Medicine

## 2022-01-05 ENCOUNTER — Encounter: Payer: Self-pay | Admitting: Family Medicine

## 2022-01-05 MED ORDER — AMPHETAMINE-DEXTROAMPHETAMINE 10 MG PO TABS: 10.0000 mg | ORAL_TABLET | Freq: Every day | ORAL | 0 refills | Status: DC

## 2022-01-05 MED ORDER — ALPRAZOLAM 0.25 MG PO TABS: 0.2500 mg | ORAL_TABLET | Freq: Three times a day (TID) | ORAL | 0 refills | Status: DC | PRN

## 2022-01-05 NOTE — Telephone Encounter (Signed)
Requesting: Adderall 10mg   ?Contract: 12/03/19 ?UDS: 07/05/21 ?Last Visit: 07/05/21 ?Next Visit: None ?Last Refill: 11/29/21 #30 and 0RF ? ?Please Advise ? ?

## 2022-01-05 NOTE — Progress Notes (Unsigned)
Requesting: xanax 0.25MG  ?Contract: 12/03/19 ?UDS: 07/05/21 ?Last Visit: 07/05/21 ?Next Visit: Advised to schedule ?Last Refill: 09/17/21 ? ?Please Advise  ?

## 2022-01-10 ENCOUNTER — Other Ambulatory Visit: Payer: Self-pay | Admitting: Family Medicine

## 2022-02-06 ENCOUNTER — Other Ambulatory Visit: Payer: Self-pay | Admitting: Family Medicine

## 2022-02-07 MED ORDER — AMPHETAMINE-DEXTROAMPHETAMINE 10 MG PO TABS: 10.0000 mg | ORAL_TABLET | Freq: Every day | ORAL | 0 refills | Status: DC

## 2022-02-07 NOTE — Telephone Encounter (Signed)
Requesting: Adderall 10mg   Contract:12/03/19 UDS: 07/05/21 Last Visit:  07/05/21 Next Visit: None Last Refill: 01/05/22 #30 and 0RF  Please Advise

## 2022-02-27 ENCOUNTER — Other Ambulatory Visit: Payer: Self-pay | Admitting: Family Medicine

## 2022-02-28 MED ORDER — AMPHETAMINE-DEXTROAMPHETAMINE 10 MG PO TABS: 10.0000 mg | ORAL_TABLET | Freq: Every day | ORAL | 0 refills | Status: DC

## 2022-02-28 NOTE — Telephone Encounter (Signed)
Requesting: Adderall 10mg   Contract: 12/03/19 UDS: 07/05/21 Last Visit: 07/05/21  Next Visit: None Last Refill: 02/07/2022 #30 and 0RF  Please Advise

## 2022-03-03 ENCOUNTER — Other Ambulatory Visit: Payer: Self-pay | Admitting: Family Medicine

## 2022-03-06 ENCOUNTER — Other Ambulatory Visit: Payer: Self-pay | Admitting: Family Medicine

## 2022-03-07 NOTE — Progress Notes (Unsigned)
Subjective:    Patient ID: Sara West, female    DOB: 03-Feb-1966, 56 y.o.   MRN: [id number]  No chief complaint on file.   HPI Patient is in today for a medication refill.  Past Medical History:  Diagnosis Date   Abnormal liver function test 02/06/2017   Abnormal thyroid blood test 02/06/2017   Anxiety 05/22/2007   Bipolar disorder 05/22/2007   per patient no   Depression    Fatigue 10/28/2016   Leg pain, bilateral 03/05/2013   Plantar fasciitis, bilateral 03/05/2013   Psoriasis 04/24/2007   Weight gain 01/19/2014    Past Surgical History:  Procedure Laterality Date   CESAREAN SECTION     x2 requiring blood transfusion (BTL with 2nd one)   CHOLECYSTECTOMY     heart ablation     times 2   TONSILECTOMY, ADENOIDECTOMY, BILATERAL MYRINGOTOMY AND TUBES      Family History  Problem Relation Age of Onset   Alcohol abuse Mother    Cancer Mother        lung, smoker   Arthritis Sister        walker, uses for balance issues   Hypertension Sister    Anorexia nervosa Daughter    Breast cancer Paternal Aunt     Social History   Socioeconomic History   Marital status: Married    Spouse name: Not on file   Number of children: Not on file   Years of education: Not on file   Highest education level: Not on file  Occupational History   Occupation: free lance for [employer]  Tobacco Use   Smoking status: Former    Types: Cigarettes    Quit date: 11/19/2005    Years since quitting: 16.3   Smokeless tobacco: Never  Vaping Use   Vaping Use: Never used  Substance and Sexual Activity   Alcohol use: Yes   Drug use: Yes    Types: Marijuana   Sexual activity: Yes    Birth control/protection: Surgical    Comment: BTL  Other Topics Concern   Not on file  Social History Narrative   Not on file   Social Determinants of Health   Financial Resource Strain: Not on file  Food Insecurity: Not on file  Transportation Needs: Not on file  Physical Activity: Not on file   Stress: Not on file  Social Connections: Not on file  Intimate Partner Violence: Not on file    Outpatient Medications Prior to Visit  Medication Sig Dispense Refill   ALPRAZolam (XANAX) 0.25 MG tablet Take 1 tablet (0.25 mg total) by mouth 3 (three) times daily as needed for anxiety. 90 tablet 0   amphetamine-dextroamphetamine (ADDERALL) 10 MG tablet Take 1 tablet (10 mg total) by mouth daily. 30 tablet 0   busPIRone (BUSPAR) 7.5 MG tablet TAKE 1 TABLET BY MOUTH 2 TIMES DAILY. 180 tablet 2   citalopram (CELEXA) 40 MG tablet TAKE 1 TABLET BY MOUTH EVERY DAY 60 tablet 3   Efinaconazole 10 % SOLN Apply 1 drop topically daily. 4 mL 11   hydrOXYzine (VISTARIL) 25 MG capsule Take 1 capsule (25 mg total) by mouth every evening. 30 capsule 1   meloxicam (MOBIC) 15 MG tablet Take 1 tablet (15 mg total) by mouth daily. 30 tablet 3   No facility-administered medications prior to visit.    Allergies  Allergen Reactions   Tylenol With Codeine #3 [Acetaminophen-Codeine] Nausea Only    ROS     Objective:  Physical Exam  LMP 10/10/2016 (Approximate)  Wt Readings from Last 3 Encounters:  07/05/21 159 lb 3.2 oz (72.2 kg)  05/06/21 164 lb 9.6 oz (74.7 kg)  10/19/20 160 lb (72.6 kg)    Diabetic Foot Exam - Simple   No data filed    Lab Results  Component Value Date   WBC 6.7 05/06/2021   HGB 12.7 05/06/2021   HCT 38.4 05/06/2021   PLT 256 05/06/2021   GLUCOSE 104 (H) 05/06/2021   CHOL 188 12/03/2019   TRIG 87.0 12/03/2019   HDL 68.50 12/03/2019   LDLCALC 102 (H) 12/03/2019   ALT 29 05/06/2021   AST 22 05/06/2021   NA 141 05/06/2021   K 5.0 05/06/2021   CL 101 05/06/2021   CREATININE 0.73 05/06/2021   BUN 15 05/06/2021   CO2 26 05/06/2021   TSH 2.62 12/03/2019   HGBA1C 6.2 12/03/2019    Lab Results  Component Value Date   TSH 2.62 12/03/2019   Lab Results  Component Value Date   WBC 6.7 05/06/2021   HGB 12.7 05/06/2021   HCT 38.4 05/06/2021   MCV 91 05/06/2021    PLT 256 05/06/2021   Lab Results  Component Value Date   NA 141 05/06/2021   K 5.0 05/06/2021   CO2 26 05/06/2021   GLUCOSE 104 (H) 05/06/2021   BUN 15 05/06/2021   CREATININE 0.73 05/06/2021   BILITOT <0.2 05/06/2021   ALKPHOS 61 05/06/2021   AST 22 05/06/2021   ALT 29 05/06/2021   PROT 7.1 05/06/2021   ALBUMIN 4.6 05/06/2021   CALCIUM 9.9 05/06/2021   ANIONGAP 15 10/19/2020   EGFR 97 05/06/2021   GFR 85.92 12/03/2019   Lab Results  Component Value Date   CHOL 188 12/03/2019   Lab Results  Component Value Date   HDL 68.50 12/03/2019   Lab Results  Component Value Date   LDLCALC 102 (H) 12/03/2019   Lab Results  Component Value Date   TRIG 87.0 12/03/2019   Lab Results  Component Value Date   CHOLHDL 3 12/03/2019   Lab Results  Component Value Date   HGBA1C 6.2 12/03/2019       Assessment & Plan:   COLONOSCOPY: PAP: PSA: DEXA:   Problem List Items Addressed This Visit   None   I am having Sara West maintain her Efinaconazole, meloxicam, busPIRone, hydrOXYzine, ALPRAZolam, amphetamine-dextroamphetamine, and citalopram.  No orders of the defined types were placed in this encounter.

## 2022-03-08 ENCOUNTER — Encounter: Payer: Self-pay | Admitting: Family Medicine

## 2022-03-08 ENCOUNTER — Ambulatory Visit: Payer: BC Managed Care – PPO | Admitting: Family Medicine

## 2022-03-08 VITALS — BP 138/88 | HR 71 | Temp 98.3°F | Resp 20 | Ht 64.5 in | Wt 161.0 lb

## 2022-03-08 DIAGNOSIS — R7989 Other specified abnormal findings of blood chemistry: Secondary | ICD-10-CM

## 2022-03-08 DIAGNOSIS — F419 Anxiety disorder, unspecified: Secondary | ICD-10-CM | POA: Diagnosis not present

## 2022-03-08 DIAGNOSIS — N289 Disorder of kidney and ureter, unspecified: Secondary | ICD-10-CM

## 2022-03-08 DIAGNOSIS — F32A Depression, unspecified: Secondary | ICD-10-CM | POA: Diagnosis not present

## 2022-03-08 DIAGNOSIS — R41840 Attention and concentration deficit: Secondary | ICD-10-CM

## 2022-03-08 DIAGNOSIS — I10 Essential (primary) hypertension: Secondary | ICD-10-CM | POA: Diagnosis not present

## 2022-03-08 DIAGNOSIS — R739 Hyperglycemia, unspecified: Secondary | ICD-10-CM | POA: Diagnosis not present

## 2022-03-08 MED ORDER — DIAZEPAM 5 MG PO TABS: 5.0000 mg | ORAL_TABLET | Freq: Two times a day (BID) | ORAL | 1 refills | Status: DC | PRN
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
## 2022-03-08 NOTE — Patient Instructions (Addendum)
[facility] and [facility] have a 24 hour 7 day a week behavioral health urgent care if you need help quickly  Shaune Spittle tea do not drink  Mood stabilizer such as Seroquel

## 2022-03-08 NOTE — Assessment & Plan Note (Signed)
hgba1c acceptable, minimize simple carbs. Increase exercise as tolerated.  

## 2022-03-08 NOTE — Assessment & Plan Note (Signed)
Continue to monitor TSH 

## 2022-03-08 NOTE — Assessment & Plan Note (Signed)
Hydrate and monitor 

## 2022-03-17 ENCOUNTER — Other Ambulatory Visit: Payer: BC Managed Care – PPO

## 2022-03-30 ENCOUNTER — Other Ambulatory Visit (INDEPENDENT_AMBULATORY_CARE_PROVIDER_SITE_OTHER): Payer: BC Managed Care – PPO

## 2022-03-30 DIAGNOSIS — R41840 Attention and concentration deficit: Secondary | ICD-10-CM | POA: Diagnosis not present

## 2022-03-30 DIAGNOSIS — I10 Essential (primary) hypertension: Secondary | ICD-10-CM

## 2022-03-30 DIAGNOSIS — R739 Hyperglycemia, unspecified: Secondary | ICD-10-CM

## 2022-03-30 DIAGNOSIS — R7989 Other specified abnormal findings of blood chemistry: Secondary | ICD-10-CM | POA: Diagnosis not present

## 2022-03-30 LAB — LIPID PANEL
Cholesterol: 151 mg/dL (ref 0–200)
HDL: 53.8 mg/dL (ref >39.00)
LDL Cholesterol: 78 mg/dL (ref 0–99)
NonHDL: 96.98
Total CHOL/HDL Ratio: 3
Triglycerides: 93 mg/dL (ref 0.0–149.0)
VLDL: 18.6 mg/dL (ref 0.0–40.0)

## 2022-03-30 LAB — COMPREHENSIVE METABOLIC PANEL
ALT: 34 U/L (ref 0–35)
AST: 21 U/L (ref 0–37)
Albumin: 4.3 g/dL (ref 3.5–5.2)
Alkaline Phosphatase: 52 U/L (ref 39–117)
BUN: 13 mg/dL (ref 6–23)
CO2: 31 mEq/L (ref 19–32)
Calcium: 9.3 mg/dL (ref 8.4–10.5)
Chloride: 103 mEq/L (ref 96–112)
Creatinine, Ser: 0.87 mg/dL (ref 0.40–1.20)
GFR: 74.75 mL/min (ref >60.00)
Glucose, Bld: 118 mg/dL — ABNORMAL HIGH (ref 70–99)
Potassium: 4.6 mEq/L (ref 3.5–5.1)
Sodium: 138 mEq/L (ref 135–145)
Total Bilirubin: 0.4 mg/dL (ref 0.2–1.2)
Total Protein: 6.7 g/dL (ref 6.0–8.3)

## 2022-03-30 LAB — CBC
HCT: 39.1 % (ref 36.0–46.0)
Hemoglobin: 12.8 g/dL (ref 12.0–15.0)
MCHC: 32.8 g/dL (ref 30.0–36.0)
MCV: 91.1 fl (ref 78.0–100.0)
Platelets: 241 10*3/uL (ref 150.0–400.0)
RBC: 4.29 Mil/uL (ref 3.87–5.11)
RDW: 13.1 % (ref 11.5–15.5)
WBC: 5.8 10*3/uL (ref 4.0–10.5)

## 2022-03-30 LAB — HEMOGLOBIN A1C: Hgb A1c MFr Bld: 6.6 % — ABNORMAL HIGH (ref 4.6–6.5)

## 2022-03-30 LAB — TSH: TSH: 4.58 u[IU]/mL (ref 0.35–5.50)

## 2022-04-03 LAB — DRUG MONITORING PANEL 376104, URINE
Alphahydroxyalprazolam: 26 ng/mL — ABNORMAL HIGH (ref <25)
Alphahydroxymidazolam: NEGATIVE ng/mL (ref <50)
Alphahydroxytriazolam: NEGATIVE ng/mL (ref <50)
Aminoclonazepam: NEGATIVE ng/mL (ref <25)
Amphetamine: 1495 ng/mL — ABNORMAL HIGH (ref <250)
Amphetamines: POSITIVE ng/mL — AB (ref <500)
Barbiturates: NEGATIVE ng/mL (ref <300)
Benzodiazepines: POSITIVE ng/mL — AB (ref <100)
Cocaine Metabolite: NEGATIVE ng/mL (ref <150)
Desmethyltramadol: NEGATIVE ng/mL (ref <100)
Hydroxyethylflurazepam: NEGATIVE ng/mL (ref <50)
Lorazepam: NEGATIVE ng/mL (ref <50)
Methamphetamine: NEGATIVE ng/mL (ref <250)
Nordiazepam: 210 ng/mL — ABNORMAL HIGH (ref <50)
Opiates: NEGATIVE ng/mL (ref <100)
Oxazepam: 623 ng/mL — ABNORMAL HIGH (ref <50)
Oxycodone: NEGATIVE ng/mL (ref <100)
Temazepam: 800 ng/mL — ABNORMAL HIGH (ref <50)
Tramadol: NEGATIVE ng/mL (ref <100)

## 2022-04-03 LAB — DM TEMPLATE

## 2022-04-15 ENCOUNTER — Encounter: Payer: Self-pay | Admitting: Family Medicine

## 2022-04-15 ENCOUNTER — Other Ambulatory Visit: Payer: Self-pay | Admitting: Family Medicine

## 2022-04-15 MED ORDER — AMPHETAMINE-DEXTROAMPHETAMINE 10 MG PO TABS: 10.0000 mg | ORAL_TABLET | Freq: Every day | ORAL | 0 refills | Status: DC

## 2022-04-15 NOTE — Telephone Encounter (Signed)
Requesting: Adderall 10mg   Contract:03/08/22 UDS: 03/30/22 Last Visit: 03/08/22 Next Visit: None Last Refill: 02/28/22 #30 and 0RF  Please Advise

## 2022-04-28 ENCOUNTER — Other Ambulatory Visit: Payer: Self-pay | Admitting: Family Medicine

## 2022-04-28 ENCOUNTER — Encounter: Payer: Self-pay | Admitting: Family Medicine

## 2022-04-28 MED ORDER — ALPRAZOLAM 0.25 MG PO TABS
0.2500 mg | ORAL_TABLET | Freq: Three times a day (TID) | ORAL | 0 refills | Status: DC | PRN
Start: 2022-04-28 — End: 2022-11-16

## 2022-04-29 ENCOUNTER — Other Ambulatory Visit: Payer: Self-pay | Admitting: Family Medicine

## 2022-05-18 ENCOUNTER — Other Ambulatory Visit: Payer: Self-pay | Admitting: Family Medicine

## 2022-05-18 MED ORDER — AMPHETAMINE-DEXTROAMPHETAMINE 10 MG PO TABS
10.0000 mg | ORAL_TABLET | Freq: Every day | ORAL | 0 refills | Status: DC
Start: 2022-05-18 — End: 2022-06-23

## 2022-05-18 NOTE — Telephone Encounter (Signed)
Requesting: Adderall 10mg   Contract: 03/08/22 UDS: 03/30/22 Last Visit: 03/08/22 Next Visit: None Last Refill: 04/15/22 #30 and 0RF  Please Advise

## 2022-06-23 ENCOUNTER — Other Ambulatory Visit: Payer: Self-pay | Admitting: Family Medicine

## 2022-06-23 MED ORDER — AMPHETAMINE-DEXTROAMPHETAMINE 10 MG PO TABS
10.0000 mg | ORAL_TABLET | Freq: Every day | ORAL | 0 refills | Status: DC
Start: 2022-06-23 — End: 2022-08-03

## 2022-06-23 NOTE — Telephone Encounter (Signed)
Requesting: Adderall 10g  Contract: 03/08/22 UDS: 03/30/22 Last Visit: 03/08/22 Next Visit:None Last Refill: 05/18/22 #30 and 0RF   Please Advise

## 2022-06-28 ENCOUNTER — Encounter: Payer: Self-pay | Admitting: Family Medicine

## 2022-06-28 ENCOUNTER — Ambulatory Visit (HOSPITAL_BASED_OUTPATIENT_CLINIC_OR_DEPARTMENT_OTHER)
Admission: RE | Admit: 2022-06-28 | Discharge: 2022-06-28 | Disposition: A | Discharge status: Home / Self Care | Payer: BC Managed Care – PPO | Source: Ambulatory Visit | Attending: Family Medicine | Admitting: Family Medicine

## 2022-06-28 ENCOUNTER — Ambulatory Visit: Payer: BC Managed Care – PPO | Admitting: Family Medicine

## 2022-06-28 ENCOUNTER — Telehealth: Payer: Self-pay | Admitting: *Deleted

## 2022-06-28 VITALS — BP 142/90 | HR 75 | Temp 98.0°F | Resp 18 | Ht 64.5 in | Wt 162.8 lb

## 2022-06-28 DIAGNOSIS — R0781 Pleurodynia: Secondary | ICD-10-CM

## 2022-06-28 DIAGNOSIS — M47814 Spondylosis without myelopathy or radiculopathy, thoracic region: Secondary | ICD-10-CM | POA: Diagnosis not present

## 2022-06-28 DIAGNOSIS — M419 Scoliosis, unspecified: Secondary | ICD-10-CM | POA: Diagnosis not present

## 2022-06-28 DIAGNOSIS — J9811 Atelectasis: Secondary | ICD-10-CM | POA: Diagnosis not present

## 2022-06-28 MED ORDER — HYDROCODONE-ACETAMINOPHEN 5-325 MG PO TABS: 1.0000 | ORAL_TABLET | Freq: Three times a day (TID) | ORAL | 0 refills | Status: DC | PRN

## 2022-06-28 NOTE — Assessment & Plan Note (Addendum)
Fell against the edge of the bathtub while she was visiting her brother out of state and injured her ribs on her right side. She has severe pain, at resty lying with heat on it the pain drops to 4 of 10 but with any movement, cough etc it spikes to 10 of 10 and causes her to feel sweaty and nauseous as well. She notes she is slowly improving but she does have a sense of shallow breathing and tightness at times especially when she feels anxious. Encouraged to try alternating ice and heat and to use topical treatments such as lidocaine. Rib xray ordered Advil/Motrin/Ibuprofen 200 mg tablets, max of 2400 mg in 24 hours total of 12 tabs Best approach 2 tabs every 4-6 hours  Tylenol/Acetaminophen/APAP ES 500 mg tabs, max of 3000 mg in 24 hours, 6 tabs 2 tabs every 8 hours Given a small amount of Norco 5/325 to use sparingly at bedtime to get some rest. Spent 20 minutes discussing presentation and plan of care

## 2022-06-28 NOTE — Patient Instructions (Addendum)
Advil/Motrin/Ibuprofen 200 mg tablets, max of 2400 mg in 24 hours total of 12 tabs Best approach 2 tabs every 4-6 hours  Tylenol/Acetaminophen/APAP ES 500 mg tabs, max of 3000 mg in 24 hours, 6 tabs 2 tabs every 8 hours Lidocaine patch  Rib Fracture  A rib fracture is a break or crack in one of the bones of the ribs. The ribs are long, curved bones that wrap around your chest and attach to your spine and your breastbone. The ribs protect your heart, lungs, and other organs in the chest. A broken or cracked rib is often painful but is not usually serious. Most rib fractures heal on their own over time. However, rib fractures can be more serious if multiple ribs are broken or if broken ribs move out of place and push against other structures or organs. What are the causes? This condition is caused by: Repetitive movements with high force, such as pitching a baseball or having very bad coughing spells. A direct hit the chest, such as a sports injury, a car crash, or a fall. Cancer that has spread to the bones, which can weaken bones and cause them to break. What are the signs or symptoms? Symptoms of this condition include: Pain when you breathe in or cough. Pain when someone presses on the injured area. Feeling short of breath. How is this diagnosed? This condition is diagnosed with a physical exam and medical history. You may also have imaging tests, such as: Chest X-ray. CT scan. MRI. Bone scan. Chest ultrasound. How is this treated? Treatment for this condition depends on the severity of the fracture. Most rib fractures usually Severe injuries may require hospitalization or surgery. Follow these instructions at home: Managing pain, stiffness, and swelling If directed, put ice on the injured area. To do this: Put ice in a plastic bag. Place a towel between your skin and the bag. Leave the ice on for 20 minutes, 2-3 times a day. Remove the ice if your skin turns bright red. This is  very important. If you cannot feel pain, heat, or cold, you have a greater risk of damage to the area. Take over-the-counter and prescription medicines only as told by your health care provider. Activity Avoid doing activities or movements that cause pain. Be careful during activities and avoid bumping the injured rib. Slowly increase your activity as told by your health care provider. General instructions Do deep breathing exercises as told by your health care provider. This helps prevent pneumonia, which is a common complication of a broken rib. Your health care provider may instruct you to: Take deep breaths several times a day. Try to cough several times a day, holding a pillow against the injured area. Use a device called incentive spirometer to practice deep breathing several times a day. Drink enough fluid to keep your urine pale yellow. Do not wear a rib belt or binder. These restrict breathing, which can lead to pneumonia. Keep all follow-up visits. This is important. Contact a health care provider if: You have a fever. Get help right away if: You have difficulty breathing or you are short of breath. You develop a cough that does not stop, or you cough up thick or bloody sputum. You have nausea, vomiting, or pain in your abdomen. Your pain gets worse and medicine does not help. These symptoms may represent a serious problem that is an emergency. Do not wait to see if the symptoms will go away. Get medical help right away. Call your  local emergency services (911 in the [place] Do not drive yourself to the hospital. Summary A rib fracture is a break or crack in one of the bones of the ribs. A broken or cracked rib is often painful but is not usually serious. Most rib fractures heal on their own over time. Treatment for this condition depends on the severity of the fracture. Avoid doing any activities or movements that cause pain. This information is not intended to replace advice  given to you by your health care provider. Make sure you discuss any questions you have with your health care provider. Document Revised: 12/27/2019 Document Reviewed: 12/27/2019 Elsevier Patient Education  [employer].

## 2022-06-28 NOTE — Telephone Encounter (Signed)
Reason for denial: The requested service is not a covered benefit per your benefit booklet or plan document.  Left detailed message and advised patient to use goodrx and coupon texted to patient to use at [facility] for $11.83.

## 2022-06-28 NOTE — Telephone Encounter (Signed)
Prior auth started via cover my meds.  Awaiting determination.  Key: [id number]

## 2022-06-28 NOTE — Progress Notes (Signed)
Subjective:   By signing my name below, I, Kellie Simmering, attest that this documentation has been prepared under the direction and in the presence of Mosie Lukes, MD 06/28/2022.     Patient ID: Sara West, female    DOB: 25-Sep-1965, 56 y.o.   MRN: [id number]  Chief Complaint  Patient presents with   Rib Injury    Here for right rib injury    HPI Patient is in today for an office visit.  Fall: She reports that on 06/18/2022 she fell in the bathroom, hitting her head on the toilet and hitting her lower right back on the bathtub. She complains of consistent back pain and nausea since the fall. She states that Advil and a heating pad have been effective at temporarily managing the pain and rates the pain as 4/10 when using these. She describes having a constant pressure in her chest and says that it hurts to cough, laugh, and sneeze. She is requesting  Hydrocodone to manage her pain and an x-ray to determine if she has fractured any ribs.  Past Medical History:  Diagnosis Date   Abnormal liver function test 02/06/2017   Abnormal thyroid blood test 02/06/2017   Anxiety 05/22/2007   Bipolar disorder 05/22/2007   per patient no   Depression    Fatigue 10/28/2016   Leg pain, bilateral 03/05/2013   Plantar fasciitis, bilateral 03/05/2013   Psoriasis 04/24/2007   Weight gain 01/19/2014   Past Surgical History:  Procedure Laterality Date   CESAREAN SECTION     x2 requiring blood transfusion (BTL with 2nd one)   CHOLECYSTECTOMY     heart ablation     times 2   TONSILECTOMY, ADENOIDECTOMY, BILATERAL MYRINGOTOMY AND TUBES     Family History  Problem Relation Age of Onset   Alcohol abuse Mother    Cancer Mother        lung, smoker   Arthritis Sister        walker, uses for balance issues   Hypertension Sister    Anorexia nervosa Daughter    Breast cancer Paternal Aunt    Social History   Socioeconomic History   Marital status: Married    Spouse name: Not on file   Number of  children: Not on file   Years of education: Not on file   Highest education level: Not on file  Occupational History   Occupation: free lance for [employer]  Tobacco Use   Smoking status: Former    Types: Cigarettes    Quit date: 11/19/2005    Years since quitting: 16.6   Smokeless tobacco: Never  Vaping Use   Vaping Use: Never used  Substance and Sexual Activity   Alcohol use: Yes   Drug use: Yes    Types: Marijuana   Sexual activity: Yes    Birth control/protection: Surgical    Comment: BTL  Other Topics Concern   Not on file  Social History Narrative   Not on file   Social Determinants of Health   Financial Resource Strain: Not on file  Food Insecurity: Not on file  Transportation Needs: Not on file  Physical Activity: Not on file  Stress: Not on file  Social Connections: Not on file  Intimate Partner Violence: Not on file   Outpatient Medications Prior to Visit  Medication Sig Dispense Refill   ALPRAZolam (XANAX) 0.25 MG tablet Take 1 tablet (0.25 mg total) by mouth 3 (three) times daily as needed for anxiety. [address]  tablet 0   amphetamine-dextroamphetamine (ADDERALL) 10 MG tablet Take 1 tablet (10 mg total) by mouth daily. 30 tablet 0   busPIRone (BUSPAR) 7.5 MG tablet TAKE 1 TABLET BY MOUTH 2 TIMES DAILY. 180 tablet 2   citalopram (CELEXA) 40 MG tablet TAKE 1 TABLET BY MOUTH EVERY DAY 90 tablet 3   Efinaconazole 10 % SOLN Apply 1 drop topically daily. (Patient not taking: Reported on 06/28/2022) 4 mL 11   hydrOXYzine (VISTARIL) 25 MG capsule Take 1 capsule (25 mg total) by mouth every evening. (Patient not taking: Reported on 06/28/2022) 30 capsule 1   meloxicam (MOBIC) 15 MG tablet Take 1 tablet (15 mg total) by mouth daily. (Patient not taking: Reported on 06/28/2022) 30 tablet 3   No facility-administered medications prior to visit.   Allergies  Allergen Reactions   Tylenol With Codeine #3 [Acetaminophen-Codeine] Nausea Only   Zyprexa [Olanzapine] Other  (See Comments)    myalgias   Review of Systems  Gastrointestinal:  Positive for nausea.  Musculoskeletal:  Positive for back pain (lower right back).      Objective:    Physical Exam Constitutional:      General: She is not in acute distress.    Appearance: Normal appearance. She is not ill-appearing.  HENT:     Head: Normocephalic and atraumatic.     Right Ear: External ear normal.     Left Ear: External ear normal.     Mouth/Throat:     Mouth: Mucous membranes are moist.     Pharynx: Oropharynx is clear.  Eyes:     Extraocular Movements: Extraocular movements intact.     Pupils: Pupils are equal, round, and reactive to light.  Cardiovascular:     Rate and Rhythm: Normal rate and regular rhythm.     Pulses: Normal pulses.     Heart sounds: Murmur heard.     Systolic murmur is present with a grade of 1/6.     No gallop.  Pulmonary:     Effort: Pulmonary effort is normal. No respiratory distress.     Breath sounds: Normal breath sounds. No wheezing or rales.  Abdominal:     General: Bowel sounds are normal.  Musculoskeletal:     Comments: (+) Right mid axillary line is tender to the touch.   Skin:    General: Skin is warm and dry.  Neurological:     Mental Status: She is alert and oriented to person, place, and time.  Psychiatric:        Mood and Affect: Mood normal.        Behavior: Behavior normal.        Judgment: Judgment normal.    BP (!) 142/90 (BP Location: Left Arm, Patient Position: Sitting, Cuff Size: Normal)   Pulse 75   Temp 98 F (36.7 C) (Oral)   Resp 18   Ht 5' 4.5" (1.638 m)   Wt 162 lb 12.8 oz (73.8 kg)   LMP 10/10/2016 (Approximate)   SpO2 100%   BMI 27.51 kg/m  Wt Readings from Last 3 Encounters:  06/28/22 162 lb 12.8 oz (73.8 kg)  03/08/22 161 lb (73 kg)  07/05/21 159 lb 3.2 oz (72.2 kg)   Diabetic Foot Exam - Simple   No data filed    Lab Results  Component Value Date   WBC 5.8 03/30/2022   HGB 12.8 03/30/2022   HCT 39.1  03/30/2022   PLT 241.0 03/30/2022   GLUCOSE 118 (H) 03/30/2022   CHOL  151 03/30/2022   TRIG 93.0 03/30/2022   HDL 53.80 03/30/2022   LDLCALC 78 03/30/2022   ALT 34 03/30/2022   AST 21 03/30/2022   NA 138 03/30/2022   K 4.6 03/30/2022   CL 103 03/30/2022   CREATININE 0.87 03/30/2022   BUN 13 03/30/2022   CO2 31 03/30/2022   TSH 4.58 03/30/2022   HGBA1C 6.6 (H) 03/30/2022   Lab Results  Component Value Date   TSH 4.58 03/30/2022   Lab Results  Component Value Date   WBC 5.8 03/30/2022   HGB 12.8 03/30/2022   HCT 39.1 03/30/2022   MCV 91.1 03/30/2022   PLT 241.0 03/30/2022   Lab Results  Component Value Date   NA 138 03/30/2022   K 4.6 03/30/2022   CO2 31 03/30/2022   GLUCOSE 118 (H) 03/30/2022   BUN 13 03/30/2022   CREATININE 0.87 03/30/2022   BILITOT 0.4 03/30/2022   ALKPHOS 52 03/30/2022   AST 21 03/30/2022   ALT 34 03/30/2022   PROT 6.7 03/30/2022   ALBUMIN 4.3 03/30/2022   CALCIUM 9.3 03/30/2022   ANIONGAP 15 10/19/2020   EGFR 97 05/06/2021   GFR 74.75 03/30/2022   Lab Results  Component Value Date   CHOL 151 03/30/2022   Lab Results  Component Value Date   HDL 53.80 03/30/2022   Lab Results  Component Value Date   LDLCALC 78 03/30/2022   Lab Results  Component Value Date   TRIG 93.0 03/30/2022   Lab Results  Component Value Date   CHOLHDL 3 03/30/2022   Lab Results  Component Value Date   HGBA1C 6.6 (H) 03/30/2022      Assessment & Plan:   Problem List Items Addressed This Visit     Rib pain on right side - Primary    Fell against the edge of the bathtub while she was visiting her brother out of state and injured her ribs on her right side. She has severe pain, at resty lying with heat on it the pain drops to 4 of 10 but with any movement, cough etc it spikes to 10 of 10 and causes her to feel sweaty and nauseous as well. She notes she is slowly improving but she does have a sense of shallow breathing and tightness at times especially  when she feels anxious. Encouraged to try alternating ice and heat and to use topical treatments such as lidocaine. Rib xray ordered Advil/Motrin/Ibuprofen 200 mg tablets, max of 2400 mg in 24 hours total of 12 tabs Best approach 2 tabs every 4-6 hours  Tylenol/Acetaminophen/APAP ES 500 mg tabs, max of 3000 mg in 24 hours, 6 tabs 2 tabs every 8 hours Given a small amount of Norco 5/325 to use sparingly at bedtime to get some rest. Spent 20 minutes discussing presentation and plan of care      Relevant Orders   DG Ribs Unilateral Right   Meds ordered this encounter  Medications   HYDROcodone-acetaminophen (NORCO/VICODIN) 5-325 MG tablet    Sig: Take 1-2 tablets by mouth 3 (three) times daily as needed for moderate pain.    Dispense:  30 tablet    Refill:  0   I, Penni Homans, MD, personally preformed the services described in this documentation.  All medical record entries made by the scribe were at my direction and in my presence.  I have reviewed the chart and discharge instructions (if applicable) and agree that the record reflects my personal performance and is accurate and  complete. 06/28/2022  I,Mohammed Iqbal,acting as a scribe for Penni Homans, MD.,have documented all relevant documentation on the behalf of Penni Homans, MD,as directed by  Penni Homans, MD while in the presence of Penni Homans, MD.  Penni Homans, MD

## 2022-06-28 NOTE — Telephone Encounter (Signed)
PA denied. Awaiting denial information.  °

## 2022-06-29 ENCOUNTER — Encounter: Payer: Self-pay | Admitting: Family Medicine

## 2022-08-03 ENCOUNTER — Other Ambulatory Visit: Payer: Self-pay | Admitting: Family Medicine

## 2022-08-03 MED ORDER — AMPHETAMINE-DEXTROAMPHETAMINE 10 MG PO TABS: 10.0000 mg | ORAL_TABLET | Freq: Every day | ORAL | 0 refills | Status: DC

## 2022-08-03 NOTE — Telephone Encounter (Signed)
Requesting: Adderall 10mg   Contract: 03/08/22 UDS: 03/30/22 Last Visit: 06/28/22 Next Visit: 12/29/22 Last Refill: 06/23/22 #30 and 0RF   Please Advise

## 2022-09-06 ENCOUNTER — Other Ambulatory Visit: Payer: Self-pay | Admitting: Family Medicine

## 2022-09-06 MED ORDER — AMPHETAMINE-DEXTROAMPHETAMINE 10 MG PO TABS
10.0000 mg | ORAL_TABLET | Freq: Every day | ORAL | 0 refills | Status: DC
Start: 2022-09-06 — End: 2022-10-21

## 2022-09-06 NOTE — Telephone Encounter (Signed)
Requesting: Adderall 10mg   Contract: 03/08/22 UDS: 03/30/22 Last Visit: 06/28/22 Next Visit: 12/29/22 Last Refill: 08/03/22 #30 and 0RF   Please Advise

## 2022-10-21 ENCOUNTER — Other Ambulatory Visit: Payer: Self-pay | Admitting: Family Medicine

## 2022-10-21 NOTE — Telephone Encounter (Signed)
Requesting: Adderall Contract: 03/30/2022 UDS: 03/30/2022 Last OV: 06/28/22 Next OV: 12/29/2022 Last Refill: 09/06/2022, #30--0 RF Database:   Please advise

## 2022-10-23 MED ORDER — AMPHETAMINE-DEXTROAMPHETAMINE 10 MG PO TABS
10.0000 mg | ORAL_TABLET | Freq: Every day | ORAL | 0 refills | Status: DC
Start: 2022-10-23 — End: 2022-11-24

## 2022-11-06 DIAGNOSIS — S79911A Unspecified injury of right hip, initial encounter: Secondary | ICD-10-CM | POA: Diagnosis not present

## 2022-11-06 DIAGNOSIS — Z6828 Body mass index (BMI) 28.0-28.9, adult: Secondary | ICD-10-CM | POA: Diagnosis not present

## 2022-11-06 DIAGNOSIS — R030 Elevated blood-pressure reading, without diagnosis of hypertension: Secondary | ICD-10-CM | POA: Diagnosis not present

## 2022-11-16 ENCOUNTER — Other Ambulatory Visit: Payer: Self-pay | Admitting: Family Medicine

## 2022-11-16 MED ORDER — ALPRAZOLAM 0.25 MG PO TABS: 0.2500 mg | ORAL_TABLET | Freq: Three times a day (TID) | ORAL | 0 refills | Status: DC | PRN

## 2022-11-16 NOTE — Telephone Encounter (Signed)
Requesting: alprazolam 0.'25mg'$   Contract: 03/08/22 UDS: 03/30/22 Last Visit: 06/28/22 Next Visit: 12/29/22 Last Refill:04/28/22 #90 and 0RF   Please Advise

## 2022-11-24 ENCOUNTER — Other Ambulatory Visit: Payer: Self-pay | Admitting: Family Medicine

## 2022-11-24 MED ORDER — AMPHETAMINE-DEXTROAMPHETAMINE 10 MG PO TABS
10.0000 mg | ORAL_TABLET | Freq: Every day | ORAL | 0 refills | Status: DC
Start: 2022-11-24 — End: 2022-12-28

## 2022-11-24 NOTE — Telephone Encounter (Signed)
Requesting: Adderall '10mg'$  Contract: No will get at next visit UDS: 03/30/22 Last Visit: 06/28/2022 Next Visit: 12/29/2022 Last Refill: 10/23/22  Please Advise

## 2022-12-28 ENCOUNTER — Encounter: Payer: Self-pay | Admitting: Family Medicine

## 2022-12-28 ENCOUNTER — Other Ambulatory Visit: Payer: Self-pay | Admitting: Family Medicine

## 2022-12-28 MED ORDER — AMPHETAMINE-DEXTROAMPHETAMINE 10 MG PO TABS: 10.0000 mg | ORAL_TABLET | Freq: Every day | ORAL | 0 refills | Status: DC

## 2022-12-28 NOTE — Assessment & Plan Note (Signed)
Stable on current meds, will continue to monitor and update UDS and paperwork

## 2022-12-28 NOTE — Assessment & Plan Note (Signed)
Doing well on Celexa and using Alprazolam intermittently with good results

## 2022-12-28 NOTE — Telephone Encounter (Signed)
Requesting: adderall 10mg  Contract: No will get at visit UDS: 03/30/22 Last Visit: 06/28/2022 Next Visit: 12/29/2022 Last Refill: 11/24/22  Please Advise

## 2022-12-28 NOTE — Assessment & Plan Note (Signed)
hgba1c acceptable, minimize simple carbs. Increase exercise as tolerated.  

## 2022-12-28 NOTE — Assessment & Plan Note (Signed)
Patient encouraged to maintain heart healthy diet, regular exercise, adequate sleep. Consider daily probiotics. Take medications as prescribed. Labs ordered and reviewed. Given and reviewed copy of ACP documents from [facility] Secretary of State and encouraged to complete and return  Colonoscopy 2018 repeat in 2028 MGM Pap

## 2022-12-29 ENCOUNTER — Ambulatory Visit (INDEPENDENT_AMBULATORY_CARE_PROVIDER_SITE_OTHER): Payer: BC Managed Care – PPO | Admitting: Family Medicine

## 2022-12-29 ENCOUNTER — Encounter: Payer: Self-pay | Admitting: Family Medicine

## 2022-12-29 VITALS — BP 140/84 | HR 71 | Temp 97.8°F | Resp 16 | Ht 64.0 in | Wt 167.0 lb

## 2022-12-29 DIAGNOSIS — R739 Hyperglycemia, unspecified: Secondary | ICD-10-CM

## 2022-12-29 DIAGNOSIS — E785 Hyperlipidemia, unspecified: Secondary | ICD-10-CM | POA: Insufficient documentation

## 2022-12-29 DIAGNOSIS — R002 Palpitations: Secondary | ICD-10-CM | POA: Diagnosis not present

## 2022-12-29 DIAGNOSIS — F32A Depression, unspecified: Secondary | ICD-10-CM

## 2022-12-29 DIAGNOSIS — M79642 Pain in left hand: Secondary | ICD-10-CM

## 2022-12-29 DIAGNOSIS — Z1231 Encounter for screening mammogram for malignant neoplasm of breast: Secondary | ICD-10-CM

## 2022-12-29 DIAGNOSIS — R2232 Localized swelling, mass and lump, left upper limb: Secondary | ICD-10-CM

## 2022-12-29 DIAGNOSIS — Z0000 Encounter for general adult medical examination without abnormal findings: Secondary | ICD-10-CM

## 2022-12-29 DIAGNOSIS — Z79899 Other long term (current) drug therapy: Secondary | ICD-10-CM | POA: Diagnosis not present

## 2022-12-29 DIAGNOSIS — M79641 Pain in right hand: Secondary | ICD-10-CM

## 2022-12-29 DIAGNOSIS — M1990 Unspecified osteoarthritis, unspecified site: Secondary | ICD-10-CM

## 2022-12-29 DIAGNOSIS — R7989 Other specified abnormal findings of blood chemistry: Secondary | ICD-10-CM

## 2022-12-29 DIAGNOSIS — Z124 Encounter for screening for malignant neoplasm of cervix: Secondary | ICD-10-CM

## 2022-12-29 DIAGNOSIS — F419 Anxiety disorder, unspecified: Secondary | ICD-10-CM | POA: Diagnosis not present

## 2022-12-29 DIAGNOSIS — N289 Disorder of kidney and ureter, unspecified: Secondary | ICD-10-CM | POA: Diagnosis not present

## 2022-12-29 DIAGNOSIS — L408 Other psoriasis: Secondary | ICD-10-CM

## 2022-12-29 DIAGNOSIS — R41840 Attention and concentration deficit: Secondary | ICD-10-CM

## 2022-12-29 NOTE — Patient Instructions (Addendum)
Wegovy or Zepbound for weight loss (Saxenda not being produced)   Shingrix is the new shingles shot, 2 shots over 2-6 months, confirm coverage with insurance and document, then can return here for shots with nurse appt or at pharmacy    Preventive Care 47-57 Years Old, Female Preventive care refers to lifestyle choices and visits with your health care provider  that can promote health and wellness. Preventive care visits are also called wellness exams. What can I expect for my preventive care visit? Counseling Your health care provider may ask you questions about your: Medical history, including: Past medical problems. Family medical history. Pregnancy history. Current health, including: Menstrual cycle. Method of birth control. Emotional well-being. Home life and relationship well-being. Sexual activity and sexual health. Lifestyle, including: Alcohol, nicotine or tobacco, and drug use. Access to firearms. Diet, exercise, and sleep habits. Work and work Astronomer. Sunscreen use. Safety issues such as seatbelt and bike helmet use. Physical exam Your health care provider will check your: Height and weight. These may be used to calculate your BMI (body mass index). BMI is a measurement that tells if you are at a healthy weight. Waist circumference. This measures the distance around your waistline. This measurement also tells if you are at a healthy weight and may help predict your risk of certain diseases, such as type 2 diabetes and high blood pressure. Heart rate and blood pressure. Body temperature. Skin for abnormal spots. What immunizations do I need?  Vaccines are usually given at various ages, according to a schedule. Your health care provider will recommend vaccines for you based on your age, medical history, and lifestyle or other factors, such as travel or where you work. What tests do I need? Screening Your health care provider may recommend screening tests for  certain conditions. This may include: Lipid and cholesterol levels. Diabetes screening. This is done by checking your blood sugar (glucose) after you have not eaten for a while (fasting). Pelvic exam and Pap test. Hepatitis B test. Hepatitis C test. HIV (human immunodeficiency virus) test. STI (sexually transmitted infection) testing, if you are at risk. Lung cancer screening. Colorectal cancer screening. Mammogram. Talk with your health care provider about when you should start having regular mammograms. This may depend on whether you have a family history of breast cancer. BRCA-related cancer screening. This may be done if you have a family history of breast, ovarian, tubal, or peritoneal cancers. Bone density scan. This is done to screen for osteoporosis. Talk with your health care provider about your test results, treatment options, and if necessary, the need for more tests. Follow these instructions at home: Eating and drinking  Eat a diet that includes fresh fruits and vegetables, whole grains, lean protein, and low-fat dairy products. Take vitamin and mineral supplements as recommended by your health care provider. Do not drink alcohol if: Your health care provider tells you not to drink. You are pregnant, may be pregnant, or are planning to become pregnant. If you drink alcohol: Limit how much you have to 0-1 drink a day. Know how much alcohol is in your drink. In the [place] one drink equals one 12 oz bottle of beer (355 mL), one 5 oz glass of wine (148 mL), or one 1 oz glass of hard liquor (44 mL). Lifestyle Brush your teeth every morning and night with fluoride toothpaste. Floss one time each day. Exercise for at least 30 minutes 5 or more days each week. Do not use any products that contain nicotine  or tobacco. These products include cigarettes, chewing tobacco, and vaping devices, such as e-cigarettes. If you need help quitting, ask your health care provider. Do not use  drugs. If you are sexually active, practice safe sex. Use a condom or other form of protection to prevent STIs. If you do not wish to become pregnant, use a form of birth control. If you plan to become pregnant, see your health care provider for a prepregnancy visit. Take aspirin only as told by your health care provider. Make sure that you understand how much to take and what form to take. Work with your health care provider to find out whether it is safe and beneficial for you to take aspirin daily. Find healthy ways to manage stress, such as: Meditation, yoga, or listening to music. Journaling. Talking to a trusted person. Spending time with friends and family. Minimize exposure to UV radiation to reduce your risk of skin cancer. Safety Always wear your seat belt while driving or riding in a vehicle. Do not drive: If you have been drinking alcohol. Do not ride with someone who has been drinking. When you are tired or distracted. While texting. If you have been using any mind-altering substances or drugs. Wear a helmet and other protective equipment during sports activities. If you have firearms in your house, make sure you follow all gun safety procedures. Seek help if you have been physically or sexually abused. What's next? Visit your health care provider once a year for an annual wellness visit. Ask your health care provider how often you should have your eyes and teeth checked. Stay up to date on all vaccines. This information is not intended to replace advice given to you by your health care provider. Make sure you discuss any questions you have with your health care provider. Document Revised: 03/03/2021 Document Reviewed: 03/03/2021 Elsevier Patient Education  2023 [employer].

## 2022-12-29 NOTE — Assessment & Plan Note (Signed)
Notes an increase in palpitations during all of this stress. Will check labs and

## 2022-12-29 NOTE — Assessment & Plan Note (Signed)
Encourage heart healthy diet such as MIND or DASH diet, increase exercise, avoid trans fats, simple carbohydrates and processed foods, consider a krill or fish or flaxseed oil cap daily.  °

## 2022-12-29 NOTE — Assessment & Plan Note (Signed)
No recent flares 

## 2022-12-29 NOTE — Assessment & Plan Note (Signed)
FH of father with psoriatic arthritis and mother of rheumatoid arthritis, check RF today, keep hands moving. Fingers stiff and new nodule on left second finger, dip joint

## 2022-12-29 NOTE — Progress Notes (Signed)
Subjective:    Patient ID: Sara West, female    DOB: 1966/04/22, 57 y.o.   MRN: [id number]  Chief Complaint  Patient presents with   Annual Exam    Annual Exam    HPI Patient is in today for follow up on chronic medical concerns and annual physical exam. No recent febrile illness or hospitalizations. Denies CP/SOB/HA/congestion/fevers/GI or GU c/o. Taking meds as prescribed. She is experiencing increased stress since her marriage has fallen apart and she is separated from her husband and the separation is contentious. She and the kids are doing well at home without him but they will be going to court in the future. He has pressed charges against her after their fights and had her put in jail but charges were dropped. She continues to see a counselor which is helping. She notes increased palpitations with her increased anxiety.   Past Medical History:  Diagnosis Date   Abnormal liver function test 02/06/2017   Abnormal thyroid blood test 02/06/2017   Anxiety 05/22/2007   Bipolar disorder 05/22/2007   per patient no   Depression    Fatigue 10/28/2016   Leg pain, bilateral 03/05/2013   Plantar fasciitis, bilateral 03/05/2013   Psoriasis 04/24/2007   Weight gain 01/19/2014    Past Surgical History:  Procedure Laterality Date   CESAREAN SECTION     x2 requiring blood transfusion (BTL with 2nd one)   CHOLECYSTECTOMY     heart ablation     times 2   TONSILECTOMY, ADENOIDECTOMY, BILATERAL MYRINGOTOMY AND TUBES      Family History  Problem Relation Age of Onset   Alcohol abuse Mother    Cancer Mother        lung, smoker   Stroke Father    Arthritis Sister        walker, uses for balance issues   Hypertension Sister    Anorexia nervosa Daughter    Breast cancer Paternal Aunt     Social History   Socioeconomic History   Marital status: Married    Spouse name: Not on file   Number of children: Not on file   Years of education: Not on file   Highest education level: Not on  file  Occupational History   Occupation: free lance for [employer]  Tobacco Use   Smoking status: Former    Types: Cigarettes    Quit date: 11/19/2005    Years since quitting: 17.1   Smokeless tobacco: Never  Vaping Use   Vaping Use: Never used  Substance and Sexual Activity   Alcohol use: Yes   Drug use: Yes    Types: Marijuana   Sexual activity: Yes    Birth control/protection: Surgical    Comment: BTL  Other Topics Concern   Not on file  Social History Narrative   Not on file   Social Determinants of Health   Financial Resource Strain: Not on file  Food Insecurity: Not on file  Transportation Needs: Not on file  Physical Activity: Not on file  Stress: Not on file  Social Connections: Not on file  Intimate Partner Violence: Not on file    Outpatient Medications Prior to Visit  Medication Sig Dispense Refill   ALPRAZolam (XANAX) 0.25 MG tablet Take 1 tablet (0.25 mg total) by mouth 3 (three) times daily as needed for anxiety. 90 tablet 0   amphetamine-dextroamphetamine (ADDERALL) 10 MG tablet Take 1 tablet (10 mg total) by mouth daily. 30 tablet 0   citalopram (  CELEXA) 40 MG tablet TAKE 1 TABLET BY MOUTH EVERY DAY 90 tablet 3   busPIRone (BUSPAR) 7.5 MG tablet TAKE 1 TABLET BY MOUTH 2 TIMES DAILY. 180 tablet 2   Efinaconazole 10 % SOLN Apply 1 drop topically daily. 4 mL 11   HYDROcodone-acetaminophen (NORCO/VICODIN) 5-325 MG tablet Take 1-2 tablets by mouth 3 (three) times daily as needed for moderate pain. 30 tablet 0   hydrOXYzine (VISTARIL) 25 MG capsule Take 1 capsule (25 mg total) by mouth every evening. 30 capsule 1   No facility-administered medications prior to visit.    Allergies  Allergen Reactions   Tylenol With Codeine #3 [Acetaminophen-Codeine] Nausea Only   Zyprexa [Olanzapine] Other (See Comments)    myalgias    Review of Systems  Constitutional:  Negative for fever and malaise/fatigue.  HENT:  Negative for congestion and tinnitus.    Eyes:  Negative for blurred vision and redness.  Respiratory:  Negative for shortness of breath.   Cardiovascular:  Positive for palpitations. Negative for chest pain and leg swelling.  Gastrointestinal:  Negative for abdominal pain, blood in stool and nausea.  Genitourinary:  Negative for dysuria and frequency.  Musculoskeletal:  Positive for joint pain. Negative for falls.  Skin:  Negative for rash.  Neurological:  Negative for dizziness, loss of consciousness and headaches.  Endo/Heme/Allergies:  Negative for environmental allergies.  Psychiatric/Behavioral:  Positive for suicidal ideas. Negative for depression. The patient is nervous/anxious.        Objective:    Physical Exam Constitutional:      General: She is not in acute distress.    Appearance: Normal appearance. She is not diaphoretic.  HENT:     Head: Normocephalic and atraumatic.     Right Ear: Tympanic membrane, ear canal and external ear normal.     Left Ear: Tympanic membrane, ear canal and external ear normal.     Nose: Nose normal.     Mouth/Throat:     Mouth: Mucous membranes are moist.     Pharynx: Oropharynx is clear. No oropharyngeal exudate.  Eyes:     General: No scleral icterus.       Right eye: No discharge.        Left eye: No discharge.     Conjunctiva/sclera: Conjunctivae normal.     Pupils: Pupils are equal, round, and reactive to light.  Neck:     Thyroid: No thyromegaly.  Cardiovascular:     Rate and Rhythm: Normal rate and regular rhythm.     Heart sounds: Normal heart sounds. No murmur heard. Pulmonary:     Effort: Pulmonary effort is normal. No respiratory distress.     Breath sounds: Normal breath sounds. No wheezing or rales.  Abdominal:     General: Bowel sounds are normal. There is no distension.     Palpations: Abdomen is soft. There is no mass.     Tenderness: There is no abdominal tenderness.  Musculoskeletal:        General: No tenderness. Normal range of motion.     Cervical  back: Normal range of motion and neck supple.  Lymphadenopathy:     Cervical: No cervical adenopathy.  Skin:    General: Skin is warm and dry.     Findings: No rash.  Neurological:     General: No focal deficit present.     Mental Status: She is alert and oriented to person, place, and time.     Cranial Nerves: No cranial nerve deficit.  Coordination: Coordination normal.     Deep Tendon Reflexes: Reflexes are normal and symmetric. Reflexes normal.  Psychiatric:        Mood and Affect: Mood normal.        Behavior: Behavior normal.        Thought Content: Thought content normal.        Judgment: Judgment normal.     BP (!) 140/84 (BP Location: Right Arm, Patient Position: Sitting, Cuff Size: Normal)   Pulse 71   Temp 97.8 F (36.6 C) (Oral)   Resp 16   Ht 5\' 4"  (1.626 m)   Wt 167 lb (75.8 kg)   LMP 10/10/2016 (Approximate)   SpO2 97%   BMI 28.67 kg/m  Wt Readings from Last 3 Encounters:  12/29/22 167 lb (75.8 kg)  06/28/22 162 lb 12.8 oz (73.8 kg)  03/08/22 161 lb (73 kg)    Diabetic Foot Exam - Simple   No data filed    Lab Results  Component Value Date   WBC 5.8 03/30/2022   HGB 12.8 03/30/2022   HCT 39.1 03/30/2022   PLT 241.0 03/30/2022   GLUCOSE 118 (H) 03/30/2022   CHOL 151 03/30/2022   TRIG 93.0 03/30/2022   HDL 53.80 03/30/2022   LDLCALC 78 03/30/2022   ALT 34 03/30/2022   AST 21 03/30/2022   NA 138 03/30/2022   K 4.6 03/30/2022   CL 103 03/30/2022   CREATININE 0.87 03/30/2022   BUN 13 03/30/2022   CO2 31 03/30/2022   TSH 4.58 03/30/2022   HGBA1C 6.6 (H) 03/30/2022    Lab Results  Component Value Date   TSH 4.58 03/30/2022   Lab Results  Component Value Date   WBC 5.8 03/30/2022   HGB 12.8 03/30/2022   HCT 39.1 03/30/2022   MCV 91.1 03/30/2022   PLT 241.0 03/30/2022   Lab Results  Component Value Date   NA 138 03/30/2022   K 4.6 03/30/2022   CO2 31 03/30/2022   GLUCOSE 118 (H) 03/30/2022   BUN 13 03/30/2022   CREATININE  0.87 03/30/2022   BILITOT 0.4 03/30/2022   ALKPHOS 52 03/30/2022   AST 21 03/30/2022   ALT 34 03/30/2022   PROT 6.7 03/30/2022   ALBUMIN 4.3 03/30/2022   CALCIUM 9.3 03/30/2022   ANIONGAP 15 10/19/2020   EGFR 97 05/06/2021   GFR 74.75 03/30/2022   Lab Results  Component Value Date   CHOL 151 03/30/2022   Lab Results  Component Value Date   HDL 53.80 03/30/2022   Lab Results  Component Value Date   LDLCALC 78 03/30/2022   Lab Results  Component Value Date   TRIG 93.0 03/30/2022   Lab Results  Component Value Date   CHOLHDL 3 03/30/2022   Lab Results  Component Value Date   HGBA1C 6.6 (H) 03/30/2022       Assessment & Plan:  Attention or concentration deficit Assessment & Plan: Stable on current meds, will continue to monitor and update UDS and paperwork   Anxiety and depression Assessment & Plan: Doing well on Celexa and using Alprazolam intermittently with good results but she has been under a great deal of stress since her marriage has fallen apart and she is separated from her husband and the separation is contentious. She and the kids are doing well at home without him but they will be going to court in the future. He has pressed charges against her after their fights and had her put in jail but  charges were dropped. She continues to see a counselor which is helping. No med changes at this time.    Hyperglycemia Assessment & Plan: hgba1c acceptable, minimize simple carbs. Increase exercise as tolerated.   Orders: -     Hemoglobin A1c  Routine general medical examination at a health care facility Assessment & Plan: Patient encouraged to maintain heart healthy diet, regular exercise, adequate sleep. Consider daily probiotics. Take medications as prescribed. Labs ordered and reviewed. Given and reviewed [place] [place] and encouraged to complete and return  [facility] follows with Dr  Hyacinth MeekerMiller but has not been seen in years. New referral placed   Abnormal thyroid blood test -     T4, free -     TSH  Renal insufficiency -     CBC with Differential/Platelet  Preventative health care -     CBC with Differential/Platelet  Palpitations Assessment & Plan: Notes an increase in palpitations during all of this stress. Will check labs and   Orders: -     Cortisol  Dyslipidemia Assessment & Plan: Encourage heart healthy diet such as MIND or DASH diet, increase exercise, avoid trans fats, simple carbohydrates and processed foods, consider a krill or fish or flaxseed oil cap daily.    Orders: -     Comprehensive metabolic panel -     Lipid panel  Encounter for screening mammogram for malignant neoplasm of breast -     3D Screening Mammogram, Left and Right; Future  High risk medication use -     Drug Monitoring Panel [phone] , Urine  Nodule of finger of left hand -     Rheumatoid factor  Pain in both hands -     Rheumatoid factor  Psoriasis Assessment & Plan: No recent flares   Arthritis Assessment & Plan: FH of father with psoriatic arthritis and mother of rheumatoid arthritis, check RF today, keep hands moving. Fingers stiff and new nodule on left second finger, dip joint   Encounter for Papanicolaou smear for cervical cancer screening -     Ambulatory referral to Obstetrics / Gynecology    Danise EdgeStacey Bayleigh Loflin, MD

## 2022-12-30 LAB — CBC WITH DIFFERENTIAL/PLATELET
Basophils Absolute: 0.1 10*3/uL (ref 0.0–0.1)
Basophils Relative: 1.3 % (ref 0.0–3.0)
Eosinophils Absolute: 0.1 10*3/uL (ref 0.0–0.7)
Eosinophils Relative: 1.2 % (ref 0.0–5.0)
HCT: 39.4 % (ref 36.0–46.0)
Hemoglobin: 13.2 g/dL (ref 12.0–15.0)
Lymphocytes Relative: 25 % (ref 12.0–46.0)
Lymphs Abs: 1.7 10*3/uL (ref 0.7–4.0)
MCHC: 33.5 g/dL (ref 30.0–36.0)
MCV: 90.8 fl (ref 78.0–100.0)
Monocytes Absolute: 0.5 10*3/uL (ref 0.1–1.0)
Monocytes Relative: 7.7 % (ref 3.0–12.0)
Neutro Abs: 4.4 10*3/uL (ref 1.4–7.7)
Neutrophils Relative %: 64.8 % (ref 43.0–77.0)
Platelets: 272 10*3/uL (ref 150.0–400.0)
RBC: 4.34 Mil/uL (ref 3.87–5.11)
RDW: 13.2 % (ref 11.5–15.5)
WBC: 6.8 10*3/uL (ref 4.0–10.5)

## 2022-12-30 LAB — COMPREHENSIVE METABOLIC PANEL
ALT: 49 U/L — ABNORMAL HIGH (ref 0–35)
AST: 24 U/L (ref 0–37)
Albumin: 4.4 g/dL (ref 3.5–5.2)
Alkaline Phosphatase: 60 U/L (ref 39–117)
BUN: 13 mg/dL (ref 6–23)
CO2: 30 mEq/L (ref 19–32)
Calcium: 9.6 mg/dL (ref 8.4–10.5)
Chloride: 101 mEq/L (ref 96–112)
Creatinine, Ser: 0.73 mg/dL (ref 0.40–1.20)
GFR: 91.78 mL/min (ref >60.00)
Glucose, Bld: 82 mg/dL (ref 70–99)
Potassium: 4.5 mEq/L (ref 3.5–5.1)
Sodium: 138 mEq/L (ref 135–145)
Total Bilirubin: 0.2 mg/dL (ref 0.2–1.2)
Total Protein: 7.1 g/dL (ref 6.0–8.3)

## 2022-12-30 LAB — T4, FREE: Free T4: 0.77 ng/dL (ref 0.60–1.60)

## 2022-12-30 LAB — LIPID PANEL
Cholesterol: 184 mg/dL (ref 0–200)
HDL: 55.5 mg/dL (ref >39.00)
LDL Cholesterol: 103 mg/dL — ABNORMAL HIGH (ref 0–99)
NonHDL: 128.4
Total CHOL/HDL Ratio: 3
Triglycerides: 127 mg/dL (ref 0.0–149.0)
VLDL: 25.4 mg/dL (ref 0.0–40.0)

## 2022-12-30 LAB — HEMOGLOBIN A1C: Hgb A1c MFr Bld: 6.6 % — ABNORMAL HIGH (ref 4.6–6.5)

## 2022-12-30 LAB — CORTISOL: Cortisol, Plasma: 7.8 ug/dL

## 2022-12-30 LAB — TSH: TSH: 2.93 u[IU]/mL (ref 0.35–5.50)

## 2022-12-30 LAB — RHEUMATOID FACTOR: Rheumatoid fact SerPl-aCnc: <14 IU/mL (ref <14)

## 2022-12-31 LAB — DRUG MONITORING PANEL 376104, URINE
Amphetamine: 829 ng/mL — ABNORMAL HIGH (ref <250)
Amphetamines: POSITIVE ng/mL — AB (ref <500)
Barbiturates: NEGATIVE ng/mL (ref <300)
Benzodiazepines: NEGATIVE ng/mL (ref <100)
Cocaine Metabolite: NEGATIVE ng/mL (ref <150)
Desmethyltramadol: NEGATIVE ng/mL (ref <100)
Methamphetamine: NEGATIVE ng/mL (ref <250)
Opiates: NEGATIVE ng/mL (ref <100)
Oxycodone: NEGATIVE ng/mL (ref <100)
Tramadol: NEGATIVE ng/mL (ref <100)

## 2022-12-31 LAB — DM TEMPLATE

## 2023-01-02 ENCOUNTER — Other Ambulatory Visit: Payer: Self-pay | Admitting: Family Medicine

## 2023-01-02 ENCOUNTER — Encounter: Payer: Self-pay | Admitting: Family Medicine

## 2023-01-02 ENCOUNTER — Other Ambulatory Visit: Payer: Self-pay

## 2023-01-02 MED ORDER — OZEMPIC (0.25 OR 0.5 MG/DOSE) 2 MG/3ML ~~LOC~~ SOPN: 0.2500 mg | PEN_INJECTOR | SUBCUTANEOUS | 2 refills | Status: DC

## 2023-01-02 MED ORDER — ONETOUCH VERIO FLEX SYSTEM W/DEVICE KIT: PACK | 0 refills | Status: DC

## 2023-01-02 MED ORDER — ONETOUCH DELICA PLUS LANCET33G MISC: 1 refills | Status: DC

## 2023-01-02 MED ORDER — ONETOUCH VERIO VI STRP: ORAL_STRIP | 1 refills | Status: DC

## 2023-01-02 NOTE — Telephone Encounter (Signed)
Medication sent and Blood Sugar reader sent

## 2023-01-02 NOTE — Telephone Encounter (Signed)
Pt called- she states okay to go ahead and send in Ozempic please.

## 2023-01-03 ENCOUNTER — Telehealth: Payer: Self-pay

## 2023-01-03 NOTE — Telephone Encounter (Signed)
Sent pt mychart message regarding medication .

## 2023-01-03 NOTE — Telephone Encounter (Signed)
Pt's insurance requires [place] for Ozempic- they are also requiring a trial of metformin first.

## 2023-01-04 ENCOUNTER — Other Ambulatory Visit: Payer: Self-pay

## 2023-01-04 MED ORDER — METFORMIN HCL ER 500 MG PO TB24: 500.0000 mg | ORAL_TABLET | Freq: Every day | ORAL | 2 refills | Status: DC

## 2023-01-04 NOTE — Telephone Encounter (Signed)
Called spoke with regarding Metformin, stated understand Ok with starting medication, and medication sent.

## 2023-01-29 ENCOUNTER — Other Ambulatory Visit: Payer: Self-pay | Admitting: Family Medicine

## 2023-01-30 MED ORDER — AMPHETAMINE-DEXTROAMPHETAMINE 10 MG PO TABS: 10.0000 mg | ORAL_TABLET | Freq: Every day | ORAL | 0 refills | Status: DC

## 2023-01-30 NOTE — Telephone Encounter (Signed)
Requesting: Adderall 10mg   Contract: 03/08/22 UDS: 12/29/22 Last Visit: 12/29/22 Next Visit: None Last Refill: 12/28/22 #30 and 0RF   Please Advise

## 2023-02-02 ENCOUNTER — Inpatient Hospital Stay (HOSPITAL_BASED_OUTPATIENT_CLINIC_OR_DEPARTMENT_OTHER): Admission: RE | Admit: 2023-02-02 | Payer: BC Managed Care – PPO | Source: Ambulatory Visit

## 2023-03-02 ENCOUNTER — Ambulatory Visit (HOSPITAL_BASED_OUTPATIENT_CLINIC_OR_DEPARTMENT_OTHER)
Admission: RE | Admit: 2023-03-02 | Discharge: 2023-03-02 | Disposition: A | Discharge status: Home / Self Care | Payer: BC Managed Care – PPO | Source: Ambulatory Visit | Attending: Family Medicine | Admitting: Family Medicine

## 2023-03-02 ENCOUNTER — Encounter (HOSPITAL_BASED_OUTPATIENT_CLINIC_OR_DEPARTMENT_OTHER): Payer: Self-pay

## 2023-03-02 DIAGNOSIS — Z1231 Encounter for screening mammogram for malignant neoplasm of breast: Secondary | ICD-10-CM | POA: Diagnosis not present

## 2023-03-06 ENCOUNTER — Encounter: Payer: Self-pay | Admitting: Family Medicine

## 2023-03-06 ENCOUNTER — Other Ambulatory Visit: Payer: Self-pay | Admitting: Family Medicine

## 2023-03-06 DIAGNOSIS — R928 Other abnormal and inconclusive findings on diagnostic imaging of breast: Secondary | ICD-10-CM

## 2023-03-06 MED ORDER — AMPHETAMINE-DEXTROAMPHETAMINE 10 MG PO TABS: 10.0000 mg | ORAL_TABLET | Freq: Every day | ORAL | 0 refills | Status: DC

## 2023-03-06 NOTE — Telephone Encounter (Signed)
Requesting: Adderall 10MG   Contract: 03/08/22 UDS: 12/29/22 Last Visit: 12/29/22 Next Visit: None Last Refill: 01/30/23 #30 and 0RF   Please Advise

## 2023-03-11 ENCOUNTER — Other Ambulatory Visit: Payer: BC Managed Care – PPO

## 2023-03-11 ENCOUNTER — Ambulatory Visit
Admission: RE | Admit: 2023-03-11 | Discharge: 2023-03-11 | Disposition: A | Discharge status: Home / Self Care | Payer: BC Managed Care – PPO | Source: Ambulatory Visit | Attending: Family Medicine | Admitting: Family Medicine

## 2023-03-11 ENCOUNTER — Ambulatory Visit: Payer: BC Managed Care – PPO

## 2023-03-11 DIAGNOSIS — R928 Other abnormal and inconclusive findings on diagnostic imaging of breast: Secondary | ICD-10-CM

## 2023-04-05 ENCOUNTER — Other Ambulatory Visit: Payer: Self-pay | Admitting: Family Medicine

## 2023-04-21 ENCOUNTER — Other Ambulatory Visit: Payer: Self-pay | Admitting: Family Medicine

## 2023-04-21 ENCOUNTER — Encounter: Payer: Self-pay | Admitting: Family Medicine

## 2023-04-21 ENCOUNTER — Telehealth: Payer: Self-pay | Admitting: Family Medicine

## 2023-04-21 NOTE — Telephone Encounter (Signed)
Pt said her Adderall was denied and she isn't sure why. Please call and advise reason.

## 2023-04-21 NOTE — Telephone Encounter (Signed)
Requesting: Adderall 10mg   Contract: 12/29/22 UDS: 12/29/22 Last Visit: 12/29/22 Next Visit: None Last Refill: 03/06/23 #30 and 0RF   Please Advise

## 2023-04-21 NOTE — Telephone Encounter (Signed)
Pcp sent mychart message

## 2023-04-23 ENCOUNTER — Other Ambulatory Visit: Payer: Self-pay | Admitting: Family Medicine

## 2023-04-23 MED ORDER — AMPHETAMINE-DEXTROAMPHETAMINE 10 MG PO TABS: 10.0000 mg | ORAL_TABLET | Freq: Every day | ORAL | 0 refills | Status: DC

## 2023-04-24 NOTE — Telephone Encounter (Signed)
Pt scheduled for Oct 8th at 3:20.

## 2023-05-25 ENCOUNTER — Other Ambulatory Visit: Payer: Self-pay | Admitting: Family Medicine

## 2023-05-25 MED ORDER — AMPHETAMINE-DEXTROAMPHETAMINE 10 MG PO TABS: 10.0000 mg | ORAL_TABLET | Freq: Every day | ORAL | 0 refills | Status: DC

## 2023-05-25 NOTE — Telephone Encounter (Signed)
Requesting: adderall 10mg  Contract: Yes UDS: 12/29/22 Last Visit: 12/29/2022 Next Visit: 06/27/2023 Last Refill: 04/23/23  Please Advise

## 2023-06-16 DIAGNOSIS — R0789 Other chest pain: Secondary | ICD-10-CM | POA: Diagnosis not present

## 2023-06-16 DIAGNOSIS — R6883 Chills (without fever): Secondary | ICD-10-CM | POA: Diagnosis not present

## 2023-06-16 DIAGNOSIS — R0600 Dyspnea, unspecified: Secondary | ICD-10-CM | POA: Diagnosis not present

## 2023-06-16 DIAGNOSIS — S20229A Contusion of unspecified back wall of thorax, initial encounter: Secondary | ICD-10-CM | POA: Diagnosis not present

## 2023-06-25 NOTE — Assessment & Plan Note (Signed)
hgba1c acceptable, minimize simple carbs. Increase exercise as tolerated.  

## 2023-06-25 NOTE — Assessment & Plan Note (Signed)
Stable on current meds 

## 2023-06-25 NOTE — Assessment & Plan Note (Signed)
Encouraged good sleep hygiene such as dark, quiet room. No blue/green glowing lights such as computer screens in bedroom. No alcohol or stimulants in evening. Cut down on caffeine as able. Regular exercise is helpful but not just prior to bed time.  

## 2023-06-25 NOTE — Assessment & Plan Note (Signed)
Hydrate and monitor 

## 2023-06-25 NOTE — Assessment & Plan Note (Signed)
Tolerating Adderall

## 2023-06-27 ENCOUNTER — Ambulatory Visit: Payer: BC Managed Care – PPO | Admitting: Family Medicine

## 2023-06-27 ENCOUNTER — Encounter: Payer: Self-pay | Admitting: Family Medicine

## 2023-06-27 VITALS — BP 130/82 | HR 83 | Temp 97.9°F | Resp 16 | Ht 64.0 in | Wt 162.8 lb

## 2023-06-27 DIAGNOSIS — F32A Depression, unspecified: Secondary | ICD-10-CM

## 2023-06-27 DIAGNOSIS — G4700 Insomnia, unspecified: Secondary | ICD-10-CM

## 2023-06-27 DIAGNOSIS — F419 Anxiety disorder, unspecified: Secondary | ICD-10-CM

## 2023-06-27 DIAGNOSIS — N289 Disorder of kidney and ureter, unspecified: Secondary | ICD-10-CM

## 2023-06-27 DIAGNOSIS — R41840 Attention and concentration deficit: Secondary | ICD-10-CM

## 2023-06-27 DIAGNOSIS — R739 Hyperglycemia, unspecified: Secondary | ICD-10-CM

## 2023-06-27 DIAGNOSIS — M25551 Pain in right hip: Secondary | ICD-10-CM

## 2023-06-27 MED ORDER — TIZANIDINE HCL 2 MG PO TABS: 1.0000 mg | ORAL_TABLET | Freq: Four times a day (QID) | ORAL | 0 refills | Status: DC | PRN

## 2023-06-27 NOTE — Patient Instructions (Signed)
Hip Pain The hip is the joint between the upper legs and the lower pelvis. The bones, cartilage, tendons, and muscles of your hip joint support your body and allow you to move around. Hip pain can range from a minor ache to severe pain in one or both of your hips. The pain may be felt on the inside of the hip joint near the groin, or on the outside near the buttocks and upper thigh. You may also have swelling or stiffness in your hip area. Follow these instructions at home: Managing pain, stiffness, and swelling     If told, put ice on the painful area. Put ice in a plastic bag. Place a towel between your skin and the bag. Leave the ice on for 20 minutes, 2-3 times a day. If told, apply heat to the affected area as often as told by your health care provider. Use the heat source that your provider recommends, such as a moist heat pack or a heating pad. Place a towel between your skin and the heat source. Leave the heat on for 20-30 minutes. If your skin turns bright red, remove the ice or heat right away to prevent skin damage. The risk of damage is higher if you cannot feel pain, heat, or cold. Activity Do exercises as told by your provider. Avoid activities that cause pain. General instructions  Take over-the-counter and prescription medicines only as told by your provider. Keep a journal of your symptoms. Write down: How often you have hip pain. The location of your pain. What the pain feels like. What makes the pain worse. Sleep with a pillow between your legs on your most comfortable side. Keep all follow-up visits. Your provider will monitor your pain and activity. Contact a health care provider if: You cannot put weight on your leg. Your pain or swelling gets worse after a week. It gets harder to walk. You have a fever. Get help right away if: You fall. You have a sudden increase in pain and swelling in your hip. Your hip is red or swollen or very tender to touch. This  information is not intended to replace advice given to you by your health care provider. Make sure you discuss any questions you have with your health care provider. Document Revised: 05/10/2022 Document Reviewed: 05/10/2022 Elsevier Patient Education  2024 [employer]  

## 2023-06-28 LAB — CBC WITH DIFFERENTIAL/PLATELET
Basophils Absolute: 0.1 10*3/uL (ref 0.0–0.1)
Basophils Relative: 1.6 % (ref 0.0–3.0)
Eosinophils Absolute: 0.1 10*3/uL (ref 0.0–0.7)
Eosinophils Relative: 1.3 % (ref 0.0–5.0)
HCT: 40.7 % (ref 36.0–46.0)
Hemoglobin: 13.3 g/dL (ref 12.0–15.0)
Lymphocytes Relative: 28.8 % (ref 12.0–46.0)
Lymphs Abs: 2.1 10*3/uL (ref 0.7–4.0)
MCHC: 32.8 g/dL (ref 30.0–36.0)
MCV: 90.6 fL (ref 78.0–100.0)
Monocytes Absolute: 0.5 10*3/uL (ref 0.1–1.0)
Monocytes Relative: 6.7 % (ref 3.0–12.0)
Neutro Abs: 4.5 10*3/uL (ref 1.4–7.7)
Neutrophils Relative %: 61.6 % (ref 43.0–77.0)
Platelets: 293 10*3/uL (ref 150.0–400.0)
RBC: 4.49 Mil/uL (ref 3.87–5.11)
RDW: 13.3 % (ref 11.5–15.5)
WBC: 7.3 10*3/uL (ref 4.0–10.5)

## 2023-06-28 LAB — LIPID PANEL
Cholesterol: 175 mg/dL (ref 0–200)
HDL: 51 mg/dL (ref >39.00)
LDL Cholesterol: 96 mg/dL (ref 0–99)
NonHDL: 124.14
Total CHOL/HDL Ratio: 3
Triglycerides: 142 mg/dL (ref 0.0–149.0)
VLDL: 28.4 mg/dL (ref 0.0–40.0)

## 2023-06-28 LAB — HEMOGLOBIN A1C: Hgb A1c MFr Bld: 6.5 % (ref 4.6–6.5)

## 2023-06-28 LAB — COMPREHENSIVE METABOLIC PANEL
ALT: 31 U/L (ref 0–35)
AST: 18 U/L (ref 0–37)
Albumin: 4.5 g/dL (ref 3.5–5.2)
Alkaline Phosphatase: 62 U/L (ref 39–117)
BUN: 17 mg/dL (ref 6–23)
CO2: 31 meq/L (ref 19–32)
Calcium: 10.4 mg/dL (ref 8.4–10.5)
Chloride: 102 meq/L (ref 96–112)
Creatinine, Ser: 0.83 mg/dL (ref 0.40–1.20)
GFR: 78.4 mL/min (ref >60.00)
Glucose, Bld: 91 mg/dL (ref 70–99)
Potassium: 4.8 meq/L (ref 3.5–5.1)
Sodium: 140 meq/L (ref 135–145)
Total Bilirubin: 0.3 mg/dL (ref 0.2–1.2)
Total Protein: 6.7 g/dL (ref 6.0–8.3)

## 2023-06-28 LAB — TSH: TSH: 2.36 u[IU]/mL (ref 0.35–5.50)
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
## 2023-06-30 ENCOUNTER — Other Ambulatory Visit: Payer: Self-pay | Admitting: Family Medicine

## 2023-06-30 MED ORDER — AMPHETAMINE-DEXTROAMPHETAMINE 10 MG PO TABS: 10.0000 mg | ORAL_TABLET | Freq: Every day | ORAL | 0 refills | Status: DC

## 2023-07-04 ENCOUNTER — Other Ambulatory Visit: Payer: Self-pay | Admitting: Family Medicine
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
## 2023-07-12 ENCOUNTER — Other Ambulatory Visit: Payer: Self-pay

## 2023-07-12 ENCOUNTER — Ambulatory Visit: Payer: BC Managed Care – PPO | Admitting: Family Medicine

## 2023-07-12 ENCOUNTER — Encounter: Payer: Self-pay | Admitting: Family Medicine

## 2023-07-12 VITALS — BP 124/84 | HR 68 | Ht 64.0 in | Wt 163.0 lb

## 2023-07-12 DIAGNOSIS — M25551 Pain in right hip: Secondary | ICD-10-CM | POA: Diagnosis not present

## 2023-07-12 NOTE — Patient Instructions (Addendum)
Thank you for coming in today.   I've referred you to Physical Therapy.  Let us know if you don't hear from them in one week.   You received an injection today. Seek immediate medical attention if the joint becomes red, extremely painful, or is oozing fluid.

## 2023-07-15 ENCOUNTER — Other Ambulatory Visit: Payer: Self-pay | Admitting: Family Medicine

## 2023-08-06 ENCOUNTER — Encounter: Payer: Self-pay | Admitting: Family Medicine

## 2023-08-07 ENCOUNTER — Other Ambulatory Visit: Payer: Self-pay | Admitting: Family Medicine

## 2023-08-07 MED ORDER — AMPHETAMINE-DEXTROAMPHETAMINE 10 MG PO TABS: 10.0000 mg | ORAL_TABLET | Freq: Every day | ORAL | 0 refills | Status: DC

## 2023-08-07 NOTE — Telephone Encounter (Signed)
Requesting: Adderall 10mg   Contract: 12/29/22 UDS: 12/29/22 Last Visit: 06/27/23 Next Visit: 01/04/24 Last Refill: 06/30/23 #30 and 0RF   Please Advise

## 2023-08-23 ENCOUNTER — Ambulatory Visit: Payer: BC Managed Care – PPO | Admitting: Family Medicine

## 2023-08-24 ENCOUNTER — Ambulatory Visit (HOSPITAL_BASED_OUTPATIENT_CLINIC_OR_DEPARTMENT_OTHER): Payer: BC Managed Care – PPO | Admitting: Physical Therapy

## 2023-09-05 ENCOUNTER — Other Ambulatory Visit: Payer: Self-pay | Admitting: Family Medicine

## 2023-09-05 MED ORDER — AMPHETAMINE-DEXTROAMPHETAMINE 10 MG PO TABS: 10.0000 mg | ORAL_TABLET | Freq: Every day | ORAL | 0 refills | Status: DC

## 2023-09-05 NOTE — Telephone Encounter (Signed)
Request: Adderall 10 Contract: none UDS: 12/29/2022 Last OV: 06/27/2023 Next OV: 01/04/2024 Last Refill:  08/07/23

## 2023-10-23 ENCOUNTER — Encounter: Payer: Self-pay | Admitting: Family Medicine

## 2023-10-24 ENCOUNTER — Other Ambulatory Visit: Payer: Self-pay | Admitting: Family Medicine

## 2023-10-24 MED ORDER — BUPROPION HCL ER (XL) 150 MG PO TB24
150.0000 mg | ORAL_TABLET | Freq: Every day | ORAL | 0 refills | Status: DC
Start: 2023-10-24 — End: 2023-12-26

## 2023-10-24 MED ORDER — BUPROPION HCL ER (XL) 300 MG PO TB24
300.0000 mg | ORAL_TABLET | Freq: Every day | ORAL | 2 refills | Status: DC
Start: 2023-10-24 — End: 2023-12-26

## 2023-10-24 MED ORDER — CITALOPRAM HYDROBROMIDE 20 MG PO TABS
20.0000 mg | ORAL_TABLET | Freq: Every day | ORAL | 0 refills | Status: DC
Start: 2023-10-24 — End: 2023-12-26

## 2023-10-25 NOTE — Telephone Encounter (Signed)
 Called and spoke with patient. Follow up appointment has been scheduled for 12/26/2023.

## 2023-10-27 ENCOUNTER — Other Ambulatory Visit: Payer: Self-pay | Admitting: Family Medicine

## 2023-10-27 MED ORDER — AMPHETAMINE-DEXTROAMPHETAMINE 10 MG PO TABS: 10.0000 mg | ORAL_TABLET | Freq: Every day | ORAL | 0 refills | Status: DC

## 2023-10-27 NOTE — Telephone Encounter (Signed)
 Requesting: Adderall 10mg   Contract: 12/29/22 UDS:12/29/22 Last Visit: 06/27/23 Next Visit: 12/26/23 Last Refill: 09/05/23 #30 and 0RF   Please Advise

## 2023-11-17 ENCOUNTER — Other Ambulatory Visit: Payer: Self-pay | Admitting: Family Medicine

## 2023-11-28 ENCOUNTER — Encounter: Payer: Self-pay | Admitting: Family Medicine

## 2023-12-11 ENCOUNTER — Other Ambulatory Visit: Payer: Self-pay | Admitting: Family Medicine

## 2023-12-12 NOTE — Telephone Encounter (Signed)
 Requesting: alprazolam 0.25mg  and Adderall 10mg   Contract: 01/04/23 UDS: 12/29/22 Last Visit: 06/27/23 Next Visit: 12/26/23 Last Refill: see med list   Please Advise

## 2023-12-13 MED ORDER — AMPHETAMINE-DEXTROAMPHETAMINE 10 MG PO TABS: 10.0000 mg | ORAL_TABLET | Freq: Every day | ORAL | 0 refills | Status: DC

## 2023-12-13 MED ORDER — ALPRAZOLAM 0.25 MG PO TABS: 0.2500 mg | ORAL_TABLET | Freq: Three times a day (TID) | ORAL | 0 refills | Status: DC | PRN

## 2023-12-24 NOTE — Assessment & Plan Note (Signed)
 Encourage heart healthy diet such as MIND or DASH diet, increase exercise, avoid trans fats, simple carbohydrates and processed foods, consider a krill or fish or flaxseed oil cap daily.

## 2023-12-24 NOTE — Assessment & Plan Note (Signed)
 hgba1c acceptable, minimize simple carbs. Increase exercise as tolerated.

## 2023-12-24 NOTE — Assessment & Plan Note (Signed)
 Hydrate and monitor

## 2023-12-24 NOTE — Assessment & Plan Note (Signed)
 Meds recently adjusted

## 2023-12-26 ENCOUNTER — Encounter: Payer: Self-pay | Admitting: Family Medicine

## 2023-12-26 ENCOUNTER — Telehealth (INDEPENDENT_AMBULATORY_CARE_PROVIDER_SITE_OTHER): Payer: BC Managed Care – PPO | Admitting: Family Medicine

## 2023-12-26 ENCOUNTER — Encounter: Payer: Self-pay | Admitting: Emergency Medicine

## 2023-12-26 DIAGNOSIS — F419 Anxiety disorder, unspecified: Secondary | ICD-10-CM

## 2023-12-26 DIAGNOSIS — E1122 Type 2 diabetes mellitus with diabetic chronic kidney disease: Secondary | ICD-10-CM

## 2023-12-26 DIAGNOSIS — F32A Depression, unspecified: Secondary | ICD-10-CM

## 2023-12-26 DIAGNOSIS — N289 Disorder of kidney and ureter, unspecified: Secondary | ICD-10-CM | POA: Diagnosis not present

## 2023-12-26 DIAGNOSIS — E785 Hyperlipidemia, unspecified: Secondary | ICD-10-CM

## 2023-12-26 MED ORDER — CITALOPRAM HYDROBROMIDE 40 MG PO TABS
40.0000 mg | ORAL_TABLET | Freq: Every day | ORAL | 1 refills | Status: DC
Start: 2023-12-26 — End: 2024-05-13

## 2023-12-26 MED ORDER — ALPRAZOLAM 0.25 MG PO TABS: 0.2500 mg | ORAL_TABLET | Freq: Three times a day (TID) | ORAL | 1 refills | Status: DC | PRN
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
## 2024-01-04 ENCOUNTER — Encounter: Payer: BC Managed Care – PPO | Admitting: Family Medicine
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
## 2024-01-12 ENCOUNTER — Ambulatory Visit: Admitting: Family Medicine

## 2024-01-12 ENCOUNTER — Other Ambulatory Visit: Payer: Self-pay

## 2024-01-12 ENCOUNTER — Ambulatory Visit (INDEPENDENT_AMBULATORY_CARE_PROVIDER_SITE_OTHER)

## 2024-01-12 VITALS — BP 152/96 | HR 77 | Ht 64.0 in | Wt 157.0 lb

## 2024-01-12 DIAGNOSIS — M79642 Pain in left hand: Secondary | ICD-10-CM

## 2024-01-12 DIAGNOSIS — G8929 Other chronic pain: Secondary | ICD-10-CM

## 2024-01-12 DIAGNOSIS — M1611 Unilateral primary osteoarthritis, right hip: Secondary | ICD-10-CM | POA: Diagnosis not present

## 2024-01-12 DIAGNOSIS — M25551 Pain in right hip: Secondary | ICD-10-CM | POA: Diagnosis not present

## 2024-01-12 DIAGNOSIS — M778 Other enthesopathies, not elsewhere classified: Secondary | ICD-10-CM | POA: Diagnosis not present

## 2024-01-12 DIAGNOSIS — M1812 Unilateral primary osteoarthritis of first carpometacarpal joint, left hand: Secondary | ICD-10-CM | POA: Diagnosis not present

## 2024-01-12 DIAGNOSIS — I878 Other specified disorders of veins: Secondary | ICD-10-CM | POA: Diagnosis not present

## 2024-01-12 NOTE — Patient Instructions (Addendum)
 Thank you for coming in today.   Please get an Xray today before you leave   You received an injection today. Seek immediate medical attention if the joint becomes red, extremely painful, or is oozing fluid.   I've referred you to Hand Therapy.  Let us  know if you don't hear from them in one week.  Please work on the home exercises the athletic trainer went over with you:  View at [email or web address] code [id number]  Let me know if you would like a referral to [facility]  Check back as needed

## 2024-01-15 NOTE — Progress Notes (Signed)
 Left hand x-ray shows mild arthritis at the base of the thumb and a little bit of arthritis at the end of the index finger.

## 2024-01-16 ENCOUNTER — Encounter: Payer: Self-pay | Admitting: Family Medicine

## 2024-01-17 ENCOUNTER — Other Ambulatory Visit: Payer: Self-pay | Admitting: Family Medicine

## 2024-01-17 MED ORDER — AMPHETAMINE-DEXTROAMPHETAMINE 10 MG PO TABS: 10.0000 mg | ORAL_TABLET | Freq: Every day | ORAL | 0 refills | Status: DC

## 2024-01-25 NOTE — Progress Notes (Signed)
Right hip x-ray shows some mild arthritis.

## 2024-01-25 NOTE — Telephone Encounter (Signed)
 Syliva Even, MD 01/25/2024 10:06 AM EDT     Right hip x-ray shows some mild arthritis.     Syliva Even, MD 01/15/2024  6:53 AM EDT     Left hand x-ray shows mild arthritis at the base of the thumb and a little bit of arthritis at the end of the index finger.

## 2024-02-01 ENCOUNTER — Encounter: Payer: Self-pay | Admitting: Family Medicine

## 2024-02-14 NOTE — Therapy (Signed)
 OUTPATIENT OCCUPATIONAL THERAPY ORTHO EVALUATION  Patient Name: Sara West MRN: [id number] DOB:1966-08-01, 58 y.o., female Today's Date: 02/15/2024  PCP: Doyne Genin MD REFERRING PROVIDER:  Syliva Even, MD    END OF SESSION:  OT End of Session - 02/15/24 1300     Visit Number 1    Number of Visits 5    Date for OT Re-Evaluation 03/15/24    Authorization Type BC[employer]    OT Start Time 1300    OT Stop Time 1345    OT Time Calculation (min) 45 min    Activity Tolerance Patient tolerated treatment well;Patient limited by fatigue;Patient limited by pain;No increased pain    Behavior During Therapy [facility] for tasks assessed/performed             Past Medical History:  Diagnosis Date   Abnormal liver function test 02/06/2017   Abnormal thyroid  blood test 02/06/2017   Anxiety 05/22/2007   Bipolar disorder 05/22/2007   per patient no   Depression    Fatigue 10/28/2016   Leg pain, bilateral 03/05/2013   Plantar fasciitis, bilateral 03/05/2013   Psoriasis 04/24/2007   Weight gain 01/19/2014   Past Surgical History:  Procedure Laterality Date   CESAREAN SECTION     x2 requiring blood transfusion (BTL with 2nd one)   CHOLECYSTECTOMY     heart ablation     times 2   TONSILECTOMY, ADENOIDECTOMY, BILATERAL MYRINGOTOMY AND TUBES     Patient Active Problem List   Diagnosis Date Noted   Dyslipidemia 12/29/2022   Arthritis 12/29/2022   Rib pain on right side 06/28/2022   Severe episode of recurrent major depressive disorder, without psychotic features (HCC)    Atrophic vaginitis 12/04/2019   Attention or concentration deficit 12/03/2019   Laryngopharyngeal reflux (LPR) 11/18/2019   Renal insufficiency 04/04/2018   Right hip pain 04/04/2018   Nausea 07/31/2017   Dizziness 07/27/2017   High blood pressure 07/27/2017   Atypical chest pain 07/27/2017   Diabetes mellitus with renal complications (HCC) 07/27/2017   Abnormal thyroid  blood test 02/06/2017   Abnormal liver function test  02/06/2017   Fatigue 10/28/2016   Severe headache 04/12/2015   Weight gain 01/19/2014   Plantar fasciitis, bilateral 03/05/2013   Leg pain, bilateral 03/05/2013   Routine general medical examination at a health care facility 08/03/2011   Palpitations 01/09/2011   Insomnia 12/10/2009   Calculus of gallbladder 05/22/2007   Anxiety and depression 05/22/2007   Psoriasis 04/24/2007    ONSET DATE: Approximately 4 to 5 months  REFERRING DIAG: M79.642,G89.29 (ICD-10-CM) - Chronic hand pain, left   THERAPY DIAG:  Pain in joint of left hand  Stiffness of left hand, not elsewhere classified  Rationale for Evaluation and Treatment: Rehabilitation  SUBJECTIVE:   SUBJECTIVE STATEMENT: She states over the past 4 to 5 months she has had increasing pain in both thumb basal joints worse in the left hand.  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  She has tried to adapt some of these activities already which is a positive thing.  She does seem to have a lack of awareness of what exactly causes her pain.     PERTINENT HISTORY: Per MD note: "Left thumb CMC joint pain"  PRECAUTIONS: None  RED FLAGS: None   WEIGHT BEARING RESTRICTIONS: No  PAIN:  Are you having pain? Yes: NPRS scale: 2/10 at rest aching, up to 8/10 in past  week at worst Pain location: Mainly left thumb basal joint Pain description: Aching and burning Aggravating factors: Gripping and squeezing Relieving factors: Unsure  FALLS: Has patient fallen in last 6 months? No  PLOF: Independent  PATIENT GOALS: To proactively work on emerging pain and soreness in the thumb basal joints  NEXT MD VISIT: As needed   OBJECTIVE: (All objective assessments below are from initial evaluation on: 02/15/2024 unless otherwise specified.)   HAND DOMINANCE: Right   ADLs: Overall ADLs: States decreased ability to grab, hold household objects, pain and  difficulty to open containers, perform FMS tasks (manipulate fasteners on clothing)   FUNCTIONAL OUTCOME MEASURES: Eval: Patient Specific Functional Scale: 7 (hold tablet, lifting household objects, "working" with Lt hand)  (Higher Score  =  Better Ability for the Selected Tasks)       UPPER EXTREMITY ROM     Shoulder to Wrist AROM Left eval  Wrist flexion 72  Wrist extension 78  Wrist ulnar deviation   Wrist radial deviation   Functional dart thrower's motion (F-DTM) in ulnar flexion   F-DTM in radial extension    (Blank rows = not tested)   Hand AROM Left eval  Full Fist Ability (or Gap to Distal Palmar Crease) full  Thumb Opposition  (Kapandji Scale)  10/10  Thumb MCP (0-60) 57  Thumb IP (0-80) 62  Thumb Radial Abduction Span  40  Thumb Palmar Abduction Span    (Blank rows = not tested)  HAND FUNCTION: Eval: Observed weakness in affected Lt hand.  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  Positive grind test on the left and some notable hyperextension at the MP joints   TODAY'S TREATMENT:  Post-evaluation treatment:   She was given information for safety/self-care for the management of hand arthritis and pain which is listed under patient instructions and was explained to her.  She was also given the following home exercise program to perform without pain and carefully 2 or 3 times a day to relieve pain and tension through the thumb joints.  Each of these were done with her carefully today with her showing understanding and not having pain, states actually feeling better already.   Exercises - Seated Wrist Flexion Stretch   - 3 x daily - 3 reps - 15 hold - Wrist Prayer Stretch  - 3 x daily - 3 reps - 15 sec hold - Stretch thumb toward base of small finger (put hand in LAP)  - 2-3 x daily - 3-5 reps - 15 sec hold - Thumb Webspace Stretch  - 3 x daily - 3 reps - 15 sec hold - Towel Roll Grip with Forearm in Neutral  - 3 x daily - 5 reps - 10 sec hold   PATIENT EDUCATION: Education details: See tx section above for details  Person educated: Patient Education method: Verbal Instruction, Teach back, Handouts  Education comprehension: States and demonstrates understanding, Additional Education required    HOME EXERCISE PROGRAM: Access Code: [email or web address] URL: [email or web address] Date: 02/15/2024 Prepared by: Leartis Proud   GOALS: Goals reviewed with patient? Yes   SHORT TERM GOALS: (STG required if POC>30 days) Target Date:  02/23/2024  Pt will demo/state understanding of initial HEP to improve pain levels and prerequisite motion. Goal status: INITIAL   LONG TERM GOALS: Target Date: 03/15/24  Pt will improve functional ability by decreased impairment per PSFS  assessment from 7 to 8 or better, for better quality of life. Goal status: INITIAL  2.  Pt will improve grip strength in left hand from 29 lbs to at least 35lbs for functional use at home and in IADLs. Goal status: INITIAL  3.  Pt will improve A/ROM in left thumb radial abduction from 40 degrees to at least 45 degrees, to have better functional motion for tasks like reach and grasp.  Goal status: INITIAL  4.  Pt will decrease pain at worst from 8/10 to 3/10 or better to have better sleep and occupational participation in daily roles. Goal status: INITIAL   ASSESSMENT:  CLINICAL IMPRESSION: Patient is a 58 y.o. female who was seen today for occupational therapy evaluation for bilateral thumb stiffness and pain worse in the left hand than the right.  The patient will benefit from outpatient occupational therapy to decrease  symptoms, improve functional upper extremity use, and increase quality of life.  PERFORMANCE DEFICITS: in functional skills including IADLs, strength, pain, fascial restrictions, muscle spasms, body mechanics, endurance, decreased knowledge of precautions, and UE functional use, cognitive skills including problem solving and safety awareness, and psychosocial skills including coping strategies, environmental adaptation, habits, and routines and behaviors.   IMPAIRMENTS: are limiting patient from ADLs, IADLs, rest and sleep, and leisure.   COMORBIDITIES: may have co-morbidities  that affects occupational performance. Patient will benefit from skilled OT to address above impairments and improve overall function.  MODIFICATION OR ASSISTANCE TO COMPLETE EVALUATION: No modification of tasks or assist necessary to complete an evaluation.  OT OCCUPATIONAL PROFILE AND HISTORY: Problem focused assessment: Including review of records relating to presenting problem.  CLINICAL DECISION MAKING: LOW - limited treatment options, no task modification necessary  REHAB POTENTIAL: Excellent  EVALUATION COMPLEXITY: Low      PLAN:  OT FREQUENCY: 1-2x/week  OT DURATION: 4 weeks through 03/15/24 and up to 5 total visits as needed   PLANNED INTERVENTIONS: 97535 self care/ADL training, 40981 therapeutic exercise, 97530 therapeutic activity, 97112 neuromuscular re-education, 97140 manual therapy, 97035 ultrasound, 97032 electrical stimulation (manual), 97760 Orthotic Initial, [id number] Orthotic/Prosthetic subsequent, compression bandaging, Dry needling, energy conservation, coping strategies training, and patient/family education  RECOMMENDED OTHER SERVICES: none now    CONSULTED AND AGREED WITH PLAN OF CARE: Patient  PLAN FOR NEXT SESSION:   Review initial HEP and recommendations    Leartis Proud, OTR/L, CHT  02/15/2024, 5:06 PM

## 2024-02-15 ENCOUNTER — Ambulatory Visit: Admitting: Rehabilitative and Restorative Service Providers"

## 2024-02-15 ENCOUNTER — Encounter: Payer: Self-pay | Admitting: Rehabilitative and Restorative Service Providers"

## 2024-02-15 DIAGNOSIS — M25642 Stiffness of left hand, not elsewhere classified: Secondary | ICD-10-CM | POA: Diagnosis not present

## 2024-02-15 DIAGNOSIS — M25542 Pain in joints of left hand: Secondary | ICD-10-CM | POA: Diagnosis not present

## 2024-02-15 NOTE — Patient Instructions (Signed)

## 2024-02-20 NOTE — Therapy (Signed)
 OUTPATIENT OCCUPATIONAL THERAPY TREATMENT NOTE   Patient Name: Sara West MRN: [id number] DOB:01/19/1966, 58 y.o., female Today's Date: 02/21/2024  PCP: Doyne Genin MD REFERRING PROVIDER:  Syliva Even, MD    END OF SESSION:  OT End of Session - 02/21/24 0803     Visit Number 2    Number of Visits 5    Date for OT Re-Evaluation 03/15/24    Authorization Type BCBS    OT Start Time 0803    OT Stop Time 0844    OT Time Calculation (min) 41 min    Activity Tolerance Patient tolerated treatment well;Patient limited by fatigue;Patient limited by pain;No increased pain    Behavior During Therapy [facility] for tasks assessed/performed              Past Medical History:  Diagnosis Date   Abnormal liver function test 02/06/2017   Abnormal thyroid  blood test 02/06/2017   Anxiety 05/22/2007   Bipolar disorder 05/22/2007   per patient no   Depression    Fatigue 10/28/2016   Leg pain, bilateral 03/05/2013   Plantar fasciitis, bilateral 03/05/2013   Psoriasis 04/24/2007   Weight gain 01/19/2014   Past Surgical History:  Procedure Laterality Date   CESAREAN SECTION     x2 requiring blood transfusion (BTL with 2nd one)   CHOLECYSTECTOMY     heart ablation     times 2   TONSILECTOMY, ADENOIDECTOMY, BILATERAL MYRINGOTOMY AND TUBES     Patient Active Problem List   Diagnosis Date Noted   Dyslipidemia 12/29/2022   Arthritis 12/29/2022   Rib pain on right side 06/28/2022   Severe episode of recurrent major depressive disorder, without psychotic features (HCC)    Atrophic vaginitis 12/04/2019   Attention or concentration deficit 12/03/2019   Laryngopharyngeal reflux (LPR) 11/18/2019   Renal insufficiency 04/04/2018   Right hip pain 04/04/2018   Nausea 07/31/2017   Dizziness 07/27/2017   High blood pressure 07/27/2017   Atypical chest pain 07/27/2017   Diabetes mellitus with renal complications (HCC) 07/27/2017   Abnormal thyroid  blood test 02/06/2017   Abnormal liver function test  02/06/2017   Fatigue 10/28/2016   Severe headache 04/12/2015   Weight gain 01/19/2014   Plantar fasciitis, bilateral 03/05/2013   Leg pain, bilateral 03/05/2013   Routine general medical examination at a health care facility 08/03/2011   Palpitations 01/09/2011   Insomnia 12/10/2009   Calculus of gallbladder 05/22/2007   Anxiety and depression 05/22/2007   Psoriasis 04/24/2007    ONSET DATE: Approximately 4 to 5 months  REFERRING DIAG: M79.642,G89.29 (ICD-10-CM) - Chronic hand pain, left   THERAPY DIAG:  Pain in joint of left hand  Stiffness of left hand, not elsewhere classified  Rationale for Evaluation and Treatment: Rehabilitation  PERTINENT HISTORY: Per MD note: "Left thumb CMC joint pain"  She states over the past 4 to 5 months she has had increasing pain in both thumb basal joints worse in the left hand.  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  She has tried to adapt some of these activities already which is a positive thing.  She does seem to have a lack of awareness of what exactly causes her pain.   PRECAUTIONS: None  RED FLAGS: None   WEIGHT BEARING RESTRICTIONS: No   SUBJECTIVE:   SUBJECTIVE STATEMENT: She states having some burning pain in the base of the thumb at times with exercises and  needs to review them.   PAIN:  Are you having pain? Yes: NPRS scale:  1-2/10 at rest aching Pain location: Mainly left thumb basal joint Pain description: Aching and burning Aggravating factors: Gripping and squeezing Relieving factors: Unsure  PATIENT GOALS: To proactively work on emerging pain and soreness in the thumb basal joints  NEXT MD VISIT: As needed   OBJECTIVE: (All objective assessments below are from initial evaluation on: 02/15/2024 unless otherwise specified.)   HAND DOMINANCE: Right   ADLs: Overall ADLs: States decreased ability to grab, hold household objects,  pain and difficulty to open containers, perform FMS tasks (manipulate fasteners on clothing)   FUNCTIONAL OUTCOME MEASURES: Eval: Patient Specific Functional Scale: 7 (hold tablet, lifting household objects, "working" with Lt hand)  (Higher Score  =  Better Ability for the Selected Tasks)       UPPER EXTREMITY ROM     Shoulder to Wrist AROM Left eval  Wrist flexion 72  Wrist extension 78  Wrist ulnar deviation   Wrist radial deviation   Functional dart thrower's motion (F-DTM) in ulnar flexion   F-DTM in radial extension    (Blank rows = not tested)   Hand AROM Left eval  Full Fist Ability (or Gap to Distal Palmar Crease) full  Thumb Opposition  (Kapandji Scale)  10/10  Thumb MCP (0-60) 57  Thumb IP (0-80) 62  Thumb Radial Abduction Span  40  Thumb Palmar Abduction Span    (Blank rows = not tested)  HAND FUNCTION: Eval: Observed weakness in affected Lt hand.  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  Positive grind test on the left and some notable hyperextension at the MP joints   TODAY'S TREATMENT:  02/21/24: Today OT carefully reviews her entire home exercise program with her, adding 1 stretch for [facility] joint extension/radial abduction as bolded below.  Each 1 was carefully performed and reviewed with her to ensure that they were not painful and not forceful.  We also again discussed activity modifications, trying to relax her hands, using supportive gloves or braces as helpful.  OT also does manual therapy IASTM through the tight dense muscles around the thenar eminence, which she states is helpful and somewhat pain relieving.  At the end of the session she states that her hand feels "warm" but not any more painful than it was.  OT encourages her to get consistent with this, do consistently but without pain at home.     Exercises - Seated Wrist Flexion Stretch  -  3 x daily - 3 reps - 15 hold - Wrist Prayer Stretch  - 3 x daily - 3 reps - 15 sec hold - Stretch thumb toward base of small finger (put hand in LAP)  - 2-3 x daily - 3-5 reps - 15 sec hold - Thumb Webspace Stretch  - 3 x daily - 3 reps - 15 sec hold - Thumb PROM Composite Extension  - 2-3 x daily - 3 reps - 15 sec hold - Towel Roll Grip with Forearm in Neutral  - 3 x daily - 5 reps - 10 sec hold   PATIENT EDUCATION: Education details: See tx section above for details  Person educated: Patient Education method: Verbal Instruction, Teach back, Handouts  Education comprehension: States and demonstrates understanding, Additional Education required    HOME EXERCISE PROGRAM: Access Code: [email or web address] URL: [email or web address] Date: 02/15/2024  Prepared by: Leartis Proud   GOALS: Goals reviewed with patient? Yes   SHORT TERM GOALS: (STG required if POC>30 days) Target Date: 02/23/2024  Pt will demo/state understanding of initial HEP to improve pain levels and prerequisite motion. Goal status: INITIAL   LONG TERM GOALS: Target Date: 03/15/24  Pt will improve functional ability by decreased impairment per PSFS  assessment from 7 to 8 or better, for better quality of life. Goal status: INITIAL  2.  Pt will improve grip strength in left hand from 29 lbs to at least 35lbs for functional use at home and in IADLs. Goal status: INITIAL  3.  Pt will improve A/ROM in left thumb radial abduction from 40 degrees to at least 45 degrees, to have better functional motion for tasks like reach and grasp.  Goal status: INITIAL  4.  Pt will decrease pain at worst from 8/10 to 3/10 or better to have better sleep and occupational participation in daily roles. Goal status: INITIAL   ASSESSMENT:  CLINICAL IMPRESSION: 02/21/24: She now has a better grasp of her home exercise program.  Continue on  Eval: Patient is a 58 y.o. female who was seen today for occupational therapy evaluation  for bilateral thumb stiffness and pain worse in the left hand than the right.  The patient will benefit from outpatient occupational therapy to decrease symptoms, improve functional upper extremity use, and increase quality of life.    PLAN:  OT FREQUENCY: 1-2x/week  OT DURATION: 4 weeks through 03/15/24 and up to 5 total visits as needed   PLANNED INTERVENTIONS: 97535 self care/ADL training, 16109 therapeutic exercise, 97530 therapeutic activity, 97112 neuromuscular re-education, 97140 manual therapy, 97035 ultrasound, 97032 electrical stimulation (manual), 97760 Orthotic Initial, [id number] Orthotic/Prosthetic subsequent, compression bandaging, Dry needling, energy conservation, coping strategies training, and patient/family education  RECOMMENDED OTHER SERVICES: none now    CONSULTED AND AGREED WITH PLAN OF CARE: Patient  PLAN FOR NEXT SESSION:   Continue on, check symptoms, consider adding radial nerve gliding if still having some burning pain, update plan of care as needed.  Check the ability to hold a cup   Leartis Proud, OTR/L, CHT  02/21/2024, 2:32 PM

## 2024-02-21 ENCOUNTER — Ambulatory Visit: Admitting: Rehabilitative and Restorative Service Providers"

## 2024-02-21 ENCOUNTER — Encounter: Payer: Self-pay | Admitting: Rehabilitative and Restorative Service Providers"

## 2024-02-21 DIAGNOSIS — M25642 Stiffness of left hand, not elsewhere classified: Secondary | ICD-10-CM

## 2024-02-21 DIAGNOSIS — M25542 Pain in joints of left hand: Secondary | ICD-10-CM

## 2024-02-27 NOTE — Therapy (Incomplete)
 OUTPATIENT OCCUPATIONAL THERAPY TREATMENT NOTE   Patient Name: Sara West MRN: [id number] DOB:[birth year], 58 y.o., female Today's Date: 02/27/2024  PCP: Doyne Genin MD REFERRING PROVIDER:  Syliva Even, MD    END OF SESSION:     Past Medical History:  Diagnosis Date   Abnormal liver function test 02/06/2017   Abnormal thyroid  blood test 02/06/2017   Anxiety 05/22/2007   Bipolar disorder 05/22/2007   per patient no   Depression    Fatigue 10/28/2016   Leg pain, bilateral 03/05/2013   Plantar fasciitis, bilateral 03/05/2013   Psoriasis 04/24/2007   Weight gain 01/19/2014   Past Surgical History:  Procedure Laterality Date   CESAREAN SECTION     x2 requiring blood transfusion (BTL with 2nd one)   CHOLECYSTECTOMY     heart ablation     times 2   TONSILECTOMY, ADENOIDECTOMY, BILATERAL MYRINGOTOMY AND TUBES     Patient Active Problem List   Diagnosis Date Noted   Dyslipidemia 12/29/2022   Arthritis 12/29/2022   Rib pain on right side 06/28/2022   Severe episode of recurrent major depressive disorder, without psychotic features (HCC)    Atrophic vaginitis 12/04/2019   Attention or concentration deficit 12/03/2019   Laryngopharyngeal reflux (LPR) 11/18/2019   Renal insufficiency 04/04/2018   Right hip pain 04/04/2018   Nausea 07/31/2017   Dizziness 07/27/2017   High blood pressure 07/27/2017   Atypical chest pain 07/27/2017   Diabetes mellitus with renal complications (HCC) 07/27/2017   Abnormal thyroid  blood test 02/06/2017   Abnormal liver function test 02/06/2017   Fatigue 10/28/2016   Severe headache 04/12/2015   Weight gain 01/19/2014   Plantar fasciitis, bilateral 03/05/2013   Leg pain, bilateral 03/05/2013   Routine general medical examination at a health care facility 08/03/2011   Palpitations 01/09/2011   Insomnia 12/10/2009   Calculus of gallbladder 05/22/2007   Anxiety and depression 05/22/2007   Psoriasis 04/24/2007    ONSET DATE: Approximately 4 to  5 months  REFERRING DIAG: M79.642,G89.29 (ICD-10-CM) - Chronic hand pain, left   THERAPY DIAG:  No diagnosis found.  Rationale for Evaluation and Treatment: Rehabilitation  PERTINENT HISTORY: Per MD note: "Left thumb CMC joint pain"  She states over the past 4 to 5 months she has had increasing pain in both thumb basal joints worse in the left hand.  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  She has tried to adapt some of these activities already which is a positive thing.  She does seem to have a lack of awareness of what exactly causes her pain.   PRECAUTIONS: None  RED FLAGS: None   WEIGHT BEARING RESTRICTIONS: No   SUBJECTIVE:   SUBJECTIVE STATEMENT: She states ***   having some burning pain in the base of the thumb at times with exercises and needs to review them.   PAIN:  Are you having pain? Yes: NPRS scale:  *** 1-2/10 at rest aching Pain location: Mainly left thumb basal joint Pain description: Aching and burning Aggravating factors: Gripping and squeezing Relieving factors: Unsure  PATIENT GOALS: To proactively work on emerging pain and soreness in the thumb basal joints  NEXT MD VISIT: As needed   OBJECTIVE: (All objective assessments below are from initial evaluation on: 02/15/2024 unless otherwise specified.)   HAND DOMINANCE: Right   ADLs: Overall ADLs: States decreased ability to grab, hold household objects, pain and difficulty to open containers,  perform FMS tasks (manipulate fasteners on clothing)   FUNCTIONAL OUTCOME MEASURES: Eval: Patient Specific Functional Scale: 7 (hold tablet, lifting household objects, "working" with Lt hand)  (Higher Score  =  Better Ability for the Selected Tasks)       UPPER EXTREMITY ROM     Shoulder to Wrist AROM Left eval  Wrist flexion 72  Wrist extension 78  Wrist ulnar deviation   Wrist radial deviation   Functional dart  thrower's motion (F-DTM) in ulnar flexion   F-DTM in radial extension    (Blank rows = not tested)   Hand AROM Left eval Lt 02/28/24  Full Fist Ability (or Gap to Distal Palmar Crease) full   Thumb Opposition  (Kapandji Scale)  10/10   Thumb MCP (0-60) 57 ***  Thumb IP (0-80) 62 ***  Thumb Radial Abduction Span  40 ***  Thumb Palmar Abduction Span     (Blank rows = not tested)  HAND FUNCTION: Eval: Observed weakness in affected Lt hand.  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  Positive grind test on the left and some notable hyperextension at the MP joints   TODAY'S TREATMENT:  02/28/24: *** 02/21/24: Continue on, check symptoms, consider adding radial nerve gliding if still having some burning pain, update plan of care as needed.  Check the ability to hold a cup    Today OT carefully reviews her entire home exercise program with her, adding 1 stretch for [facility] joint extension/radial abduction as bolded below.  Each 1 was carefully performed and reviewed with her to ensure that they were not painful and not forceful.  We also again discussed activity modifications, trying to relax her hands, using supportive gloves or braces as helpful.  OT also does manual therapy IASTM through the tight dense muscles around the thenar eminence, which she states is helpful and somewhat pain relieving.  At the end of the session she states that her hand feels "warm" but not any more painful than it was.  OT encourages her to get consistent with this, do consistently but without pain at home.     Exercises - Seated Wrist Flexion Stretch  - 3 x daily - 3 reps - 15 hold - Wrist Prayer Stretch  - 3 x daily - 3 reps - 15 sec hold - Stretch thumb toward base of small finger (put hand in LAP)  - 2-3 x daily - 3-5 reps - 15 sec hold - Thumb Webspace Stretch  - 3 x daily - 3 reps - 15 sec hold -  Thumb PROM Composite Extension  - 2-3 x daily - 3 reps - 15 sec hold - Towel Roll Grip with Forearm in Neutral  - 3 x daily - 5 reps - 10 sec hold   PATIENT EDUCATION: Education details: See tx section above for details  Person educated: Patient Education method: Verbal Instruction, Teach back, Handouts  Education comprehension: States and demonstrates understanding, Additional Education required    HOME EXERCISE PROGRAM: Access Code: [email or web address] URL: [email or web address] Date: 02/15/2024 Prepared by: Leartis Proud   GOALS: Goals reviewed with patient? Yes   SHORT TERM GOALS: (STG required if POC>30 days) Target Date: 02/23/2024  Pt will demo/state understanding of initial HEP to improve pain levels and prerequisite motion. Goal status: INITIAL   LONG TERM GOALS: Target Date: 03/15/24  Pt will improve functional ability by decreased impairment  per PSFS  assessment from 7 to 8 or better, for better quality of life. Goal status: INITIAL  2.  Pt will improve grip strength in left hand from 29 lbs to at least 35lbs for functional use at home and in IADLs. Goal status: INITIAL  3.  Pt will improve A/ROM in left thumb radial abduction from 40 degrees to at least 45 degrees, to have better functional motion for tasks like reach and grasp.  Goal status: INITIAL  4.  Pt will decrease pain at worst from 8/10 to 3/10 or better to have better sleep and occupational participation in daily roles. Goal status: INITIAL   ASSESSMENT:  CLINICAL IMPRESSION: 02/28/24: ***  02/21/24: She now has a better grasp of her home exercise program.  Continue on  Eval: Patient is a 58 y.o. female who was seen today for occupational therapy evaluation for bilateral thumb stiffness and pain worse in the left hand than the right.  The patient will benefit from outpatient occupational therapy to decrease symptoms, improve functional upper extremity use, and increase quality of  life.    PLAN:  OT FREQUENCY: 1-2x/week  OT DURATION: 4 weeks through 03/15/24 and up to 5 total visits as needed   PLANNED INTERVENTIONS: 97535 self care/ADL training, 16109 therapeutic exercise, 97530 therapeutic activity, 97112 neuromuscular re-education, 97140 manual therapy, 97035 ultrasound, 97032 electrical stimulation (manual), 97760 Orthotic Initial, [id number] Orthotic/Prosthetic subsequent, compression bandaging, Dry needling, energy conservation, coping strategies training, and patient/family education  RECOMMENDED OTHER SERVICES: none now    CONSULTED AND AGREED WITH PLAN OF CARE: Patient  PLAN FOR NEXT SESSION:   ***   Leartis Proud, OTR/L, CHT  02/27/2024, 4:53 PM

## 2024-02-28 ENCOUNTER — Encounter: Admitting: Rehabilitative and Restorative Service Providers"

## 2024-02-28 ENCOUNTER — Telehealth: Payer: Self-pay | Admitting: Rehabilitative and Restorative Service Providers"

## 2024-02-28 NOTE — Telephone Encounter (Signed)
 OT called patient to discuss missed appointment. OT left message with pt about next appointment date/time. Future missed appointments could lead to early discharge from therapy.

## 2024-03-04 NOTE — Therapy (Addendum)
 " OUTPATIENT OCCUPATIONAL THERAPY TREATMENT & DISCHARGE NOTE   Patient Name: Sara West MRN: [id number] DOB:07/02/1966, 58 y.o., female Today's Date: 03/06/2024  PCP: Domenica EDISON MD REFERRING PROVIDER:  Joane Artist RAMAN, MD                       OCCUPATIONAL THERAPY DISCHARGE SUMMARY  Visits from Start of Care: 3  Pt did not show up to additional OT visits and is officially D/C at this point.  Please see notes for any details about status, but unfortunately LTG's could not be finalized.   Melvenia Ada, OTR/L, CHT 10/10/24                END OF SESSION:  OT End of Session - 03/06/24 0802     Visit Number 3    Number of Visits 5    Date for OT Re-Evaluation 03/15/24    Authorization Type BC[employer]    OT Start Time 0802    OT Stop Time 0844    OT Time Calculation (min) 42 min    Activity Tolerance Patient tolerated treatment well;Patient limited by fatigue;Patient limited by pain;No increased pain    Behavior During Therapy [facility] for tasks assessed/performed            Past Medical History:  Diagnosis Date   Abnormal liver function test 02/06/2017   Abnormal thyroid  blood test 02/06/2017   Anxiety 05/22/2007   Bipolar disorder 05/22/2007   per patient no   Depression    Fatigue 10/28/2016   Leg pain, bilateral 03/05/2013   Plantar fasciitis, bilateral 03/05/2013   Psoriasis 04/24/2007   Weight gain 01/19/2014   Past Surgical History:  Procedure Laterality Date   CESAREAN SECTION     x2 requiring blood transfusion (BTL with 2nd one)   CHOLECYSTECTOMY     heart ablation     times 2   TONSILECTOMY, ADENOIDECTOMY, BILATERAL MYRINGOTOMY AND TUBES     Patient Active Problem List   Diagnosis Date Noted   Dyslipidemia 12/29/2022   Arthritis 12/29/2022   Rib pain on right side 06/28/2022   Severe episode of recurrent major depressive disorder, without psychotic features (HCC)    Atrophic vaginitis 12/04/2019   Attention or concentration deficit  12/03/2019   Laryngopharyngeal reflux (LPR) 11/18/2019   Renal insufficiency 04/04/2018   Right hip pain 04/04/2018   Nausea 07/31/2017   Dizziness 07/27/2017   High blood pressure 07/27/2017   Atypical chest pain 07/27/2017   Diabetes mellitus with renal complications (HCC) 07/27/2017   Abnormal thyroid  blood test 02/06/2017   Abnormal liver function test 02/06/2017   Fatigue 10/28/2016   Severe headache 04/12/2015   Weight gain 01/19/2014   Plantar fasciitis, bilateral 03/05/2013   Leg pain, bilateral 03/05/2013   Routine general medical examination at a health care facility 08/03/2011   Palpitations 01/09/2011   Insomnia 12/10/2009   Calculus of gallbladder 05/22/2007   Anxiety and depression 05/22/2007   Psoriasis 04/24/2007    ONSET DATE: Approximately 4 to 5 months  REFERRING DIAG: M79.642,G89.29 (ICD-10-CM) - Chronic hand pain, left   THERAPY DIAG:  Pain in joint of left hand  Stiffness of left hand, not elsewhere classified  Rationale for Evaluation and Treatment: Rehabilitation  PERTINENT HISTORY: Per MD note: Left thumb CMC joint pain  She states over the past 4 to 5 months she has had increasing pain in both thumb basal joints worse in the left hand.  She works for [employer]  for [employer] and has pain holding her [place]lee and doing yard work and opening containers, etc.  She has tried to adapt some of these activities already which is a positive thing.  She does seem to have a lack of awareness of what exactly causes her pain.   PRECAUTIONS: None  RED FLAGS: None   WEIGHT BEARING RESTRICTIONS: No   SUBJECTIVE:   SUBJECTIVE STATEMENT: She states she has a constant dull ache and states sometimes it feels like a burning or nerve pain in her thumb   PAIN:  Are you having pain? Yes: NPRS scale:  2/10 at rest aching Pain location: Mainly left thumb basal joint Pain description: Aching and burning Aggravating factors: Gripping and squeezing Relieving  factors: Unsure  PATIENT GOALS: To proactively work on emerging pain and soreness in the thumb basal joints  NEXT MD VISIT: As needed   OBJECTIVE: (All objective assessments below are from initial evaluation on: 02/15/2024 unless otherwise specified.)   HAND DOMINANCE: Right   ADLs: Overall ADLs: States decreased ability to grab, hold household objects, pain and difficulty to open containers, perform FMS tasks (manipulate fasteners on clothing)   FUNCTIONAL OUTCOME MEASURES: Eval: Patient Specific Functional Scale: 7 (hold tablet, lifting household objects, working with Lt hand)  (Higher Score  =  Better Ability for the Selected Tasks)       UPPER EXTREMITY ROM     Shoulder to Wrist AROM Left eval  Wrist flexion 72  Wrist extension 78  Wrist ulnar deviation   Wrist radial deviation   Functional dart thrower's motion (F-DTM) in ulnar flexion   F-DTM in radial extension    (Blank rows = not tested)   Hand AROM Left eval Lt 03/06/24  Full Fist Ability (or Gap to Distal Palmar Crease) full   Thumb Opposition  (Kapandji Scale)  10/10   Thumb MCP (0-60) 57 62  Thumb IP (0-80) 62 80  Thumb Radial Abduction Span  40 45  Thumb Palmar Abduction Span     (Blank rows = not tested)  HAND FUNCTION: Grip: Lt grip: 28#   Eval: Observed weakness in affected Lt hand.  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  Positive grind test on the left and some notable hyperextension at the MP joints   TODAY'S TREATMENT:  03/06/24: As she has some continued complaints of burning and almost nervelike symptoms, OT considers the idea that she could have some radial nerve impingement.  She does have an old injury near her radial tunnel from a penetrating wound.  She does have some radial nerve tightness when performing nerve glides today, so these were added to her program gently.  We  also review her home exercises, OT performing them with her to ensure that they are not painful.  She has relief of burning pain by the end of the session and states no significant problems.  OT urges her to be consistent with her stretch program but also not cause pain pain.  Next week is the last week on this current plan of care and she was made aware of that.    Exercises - Seated Wrist Flexion Stretch  - 3 x daily - 3 reps - 15 hold - Wrist Prayer Stretch  - 3 x daily - 3 reps - 15 sec hold - Stretch thumb toward base of small finger (put hand in LAP)  -  2-3 x daily - 3-5 reps - 15 sec hold - Thumb Webspace Stretch  - 3 x daily - 3 reps - 15 sec hold - Thumb PROM Composite Extension  - 2-3 x daily - 3 reps - 15 sec hold - Towel Roll Grip with Forearm in Neutral  - 3 x daily - 5 reps - 10 sec hold - Standing Radial Nerve Glide  - 4-6 x daily - 1 sets - 10-15 reps   PATIENT EDUCATION: Education details: See tx section above for details  Person educated: Patient Education method: Verbal Instruction, Teach back, Handouts  Education comprehension: States and demonstrates understanding, Additional Education required    HOME EXERCISE PROGRAM: Access Code: [email or web address] URL: [email or web address] Date: 02/15/2024 Prepared by: Melvenia Ada   GOALS: Goals reviewed with patient? Yes   SHORT TERM GOALS: (STG required if POC>30 days) Target Date: 02/23/2024  Pt will demo/state understanding of initial HEP to improve pain levels and prerequisite motion. Goal status: 03/06/24 MET   LONG TERM GOALS: Target Date: 03/15/24  Pt will improve functional ability by decreased impairment per PSFS  assessment from 7 to 8 or better, for better quality of life. Goal status: INITIAL  2.  Pt will improve grip strength in left hand from 29 lbs to at least 35lbs for functional use at home and in IADLs. Goal status: INITIAL  3.  Pt will improve A/ROM in left thumb radial abduction from 40  degrees to at least 45 degrees, to have better functional motion for tasks like reach and grasp.  Goal status: INITIAL  4.  Pt will decrease pain at worst from 8/10 to 3/10 or better to have better sleep and occupational participation in daily roles. Goal status: INITIAL   ASSESSMENT:  CLINICAL IMPRESSION: 03/06/24: Adding new radial nerve gliding does seem helpful to decrease her burning pain around her thumb.  Carry on  02/21/24: She now has a better grasp of her home exercise program.  Continue on  Eval: Patient is a 58 y.o. female who was seen today for occupational therapy evaluation for bilateral thumb stiffness and pain worse in the left hand than the right.  The patient will benefit from outpatient occupational therapy to decrease symptoms, improve functional upper extremity use, and increase quality of life.    PLAN:  OT FREQUENCY: 1-2x/week  OT DURATION: 4 weeks through 03/15/24 and up to 5 total visits as needed   PLANNED INTERVENTIONS: 97535 self care/ADL training, 02889 therapeutic exercise, 97530 therapeutic activity, 97112 neuromuscular re-education, 97140 manual therapy, 97035 ultrasound, 97032 electrical stimulation (manual), 97760 Orthotic Initial, [id number] Orthotic/Prosthetic subsequent, compression bandaging, Dry needling, energy conservation, coping strategies training, and patient/family education  RECOMMENDED OTHER SERVICES: none now    CONSULTED AND AGREED WITH PLAN OF CARE: Patient  PLAN FOR NEXT SESSION:   Progress note and determine need for additional therapy   Melvenia Ada, OTR/L, CHT  03/06/2024, 1:42 PM   "

## 2024-03-05 ENCOUNTER — Encounter: Payer: Self-pay | Admitting: Family Medicine

## 2024-03-06 ENCOUNTER — Encounter: Payer: Self-pay | Admitting: Rehabilitative and Restorative Service Providers"

## 2024-03-06 ENCOUNTER — Ambulatory Visit: Admitting: Rehabilitative and Restorative Service Providers"

## 2024-03-06 DIAGNOSIS — M25642 Stiffness of left hand, not elsewhere classified: Secondary | ICD-10-CM | POA: Diagnosis not present

## 2024-03-06 DIAGNOSIS — M25542 Pain in joints of left hand: Secondary | ICD-10-CM | POA: Diagnosis not present

## 2024-03-06 MED ORDER — AMPHETAMINE-DEXTROAMPHETAMINE 10 MG PO TABS: 10.0000 mg | ORAL_TABLET | Freq: Every day | ORAL | 0 refills | Status: DC

## 2024-03-06 NOTE — Telephone Encounter (Signed)
 Requesting: adderall  Contract:01/04/2023 UDS:12/29/2022 Last Visit:12/26/2023 Next Visit:07/04/24 Last Refill:01/17/24  Please Advise

## 2024-03-20 ENCOUNTER — Encounter: Admitting: Rehabilitative and Restorative Service Providers"

## 2024-04-10 NOTE — Assessment & Plan Note (Signed)
 Encourage heart healthy diet such as MIND or DASH diet, increase exercise, avoid trans fats, simple carbohydrates and processed foods, consider a krill or fish or flaxseed oil cap daily.

## 2024-04-10 NOTE — Assessment & Plan Note (Signed)
 hgba1c acceptable, minimize simple carbs. Increase exercise as tolerated.

## 2024-04-11 ENCOUNTER — Encounter: Payer: Self-pay | Admitting: Family Medicine

## 2024-04-11 ENCOUNTER — Telehealth (INDEPENDENT_AMBULATORY_CARE_PROVIDER_SITE_OTHER): Admitting: Family Medicine

## 2024-04-11 DIAGNOSIS — M2550 Pain in unspecified joint: Secondary | ICD-10-CM | POA: Diagnosis not present

## 2024-04-11 DIAGNOSIS — E785 Hyperlipidemia, unspecified: Secondary | ICD-10-CM

## 2024-04-11 DIAGNOSIS — E1122 Type 2 diabetes mellitus with diabetic chronic kidney disease: Secondary | ICD-10-CM | POA: Diagnosis not present

## 2024-04-11 MED ORDER — ESTRADIOL 0.025 MG/24HR TD PTWK: 0.0250 mg | MEDICATED_PATCH | TRANSDERMAL | 5 refills | Status: DC

## 2024-04-11 MED ORDER — PROGESTERONE MICRONIZED 100 MG PO CAPS
ORAL_CAPSULE | ORAL | 4 refills | Status: AC
Start: 2024-04-11 — End: ?

## 2024-04-14 ENCOUNTER — Encounter: Payer: Self-pay | Admitting: Family Medicine

## 2024-04-14 NOTE — Progress Notes (Signed)
 MyChart Video Visit    Virtual Visit via Video Note   This patient is at least at moderate risk for complications without adequate follow up. This format is felt to be most appropriate for this patient at this time. Physical exam was limited by quality of the video and audio technology used for the visit. Porsha, CMA was able to get the patient set up on a video visit.  Patient location: home Patient and provider in visit Provider location: Office  I discussed the limitations of evaluation and management by telemedicine and the availability of in person appointments. The patient expressed understanding and agreed to proceed.  Visit Date: 04/11/2024  Today's healthcare provider: Harlene Horton, MD     Subjective:    Patient ID: Sara West, female    DOB: Jun 19, 1966, 58 y.o.   MRN: [id number]  Chief Complaint  Patient presents with  . Acute Visit    Patient present for discuss of hormone replacement therapy.    HPI Discussed the use of AI scribe software for clinical note transcription with the patient, who gave verbal consent to proceed.  History of Present Illness Sara West is a 58 year old female who presents with joint pain and menopausal symptoms.  She experiences significant joint pain, particularly in her elbows, arms, and hands, which worsens by the end of the day. 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 There is a family history of psoriatic arthritis, as her father is affected by this condition.  She describes menopausal symptoms including cognitive difficulties, poor sleep, and neuralgic pains in various parts of her body such as her foot, arm, and leg. She also experiences thinning of the skin in the genital area, making it painful to wipe after using the bathroom. She has tried using Premarin  cream for the thinning skin but has not been consistent with its application.  She feels weak which she attributes to aging and menopause. She has  gained about ten pounds, which she associates with poor eating habits during her ongoing divorce. She is currently not on hormone replacement therapy.    Past Medical History:  Diagnosis Date  . Abnormal liver function test 02/06/2017  . Abnormal thyroid  blood test 02/06/2017  . Anxiety 05/22/2007  . Bipolar disorder 05/22/2007   per patient no  . Depression   . Fatigue 10/28/2016  . Leg pain, bilateral 03/05/2013  . Plantar fasciitis, bilateral 03/05/2013  . Psoriasis 04/24/2007  . Weight gain 01/19/2014    Past Surgical History:  Procedure Laterality Date  . CESAREAN SECTION     x2 requiring blood transfusion (BTL with 2nd one)  . CHOLECYSTECTOMY    . heart ablation     times 2  . TONSILECTOMY, ADENOIDECTOMY, BILATERAL MYRINGOTOMY AND TUBES      Family History  Problem Relation Age of Onset  . Alcohol  abuse Mother   . Cancer Mother        lung, smoker  . Stroke Father   . Arthritis Sister        walker, uses for balance issues  . Hypertension Sister   . Anorexia nervosa Daughter   . Breast cancer Paternal Aunt     Social History   Socioeconomic History  . Marital status: Divorced    Spouse name: Not on file  . Number of children: Not on file  . Years of education: Not on file  . Highest education level: Not on file  Occupational History  .  Occupation: free lance for [employer]  Tobacco Use  . Smoking status: Former    Current packs/day: 0.00    Types: Cigarettes    Quit date: 11/19/2005    Years since quitting: 18.4  . Smokeless tobacco: Never  Vaping Use  . Vaping status: Never Used  Substance and Sexual Activity  . Alcohol  use: Yes  . Drug use: Yes    Types: Marijuana  . Sexual activity: Yes    Birth control/protection: Surgical    Comment: BTL  Other Topics Concern  . Not on file  Social History Narrative  . Not on file   Social Drivers of Health   Financial Resource Strain: Not on file  Food Insecurity: Not on file  Transportation  Needs: Not on file  Physical Activity: Not on file  Stress: Not on file  Social Connections: Not on file  Intimate Partner Violence: Not on file    Outpatient Medications Prior to Visit  Medication Sig Dispense Refill  . ALPRAZolam  (XANAX ) 0.25 MG tablet Take 1 tablet (0.25 mg total) by mouth 3 (three) times daily as needed for anxiety. 30 tablet 1  . amphetamine -dextroamphetamine  (ADDERALL) 10 MG tablet Take 1 tablet (10 mg total) by mouth daily. April 2025 30 tablet 0  . Blood Glucose Monitoring Suppl (ONETOUCH VERIO FLEX SYSTEM) w/Device KIT Check Blood Sugar PRN (Patient not taking: Reported on 04/11/2024) 1 kit 0  . citalopram  (CELEXA ) 40 MG tablet Take 1 tablet (40 mg total) by mouth daily. (Patient not taking: Reported on 04/11/2024) 90 tablet 1  . glucose blood (ONETOUCH VERIO) test strip CHECK BLOOD SUGAR AS NEEDED (Patient not taking: Reported on 04/11/2024) 100 strip 1  . Lancets (ONETOUCH DELICA PLUS LANCET33G) MISC CHECK BLOOD SUGAR AS NEEDED (Patient not taking: Reported on 04/11/2024) 100 each 1  . metFORMIN  (GLUCOPHAGE -XR) 500 MG 24 hr tablet TAKE 1 TABLET BY MOUTH EVERY DAY WITH BREAKFAST (Patient not taking: Reported on 04/11/2024) 90 tablet 3  . tiZANidine  (ZANAFLEX ) 2 MG tablet TAKE 0.5-2 TABLETS (1-4 MG TOTAL) BY MOUTH EVERY 6 (SIX) HOURS AS NEEDED FOR MUSCLE SPASMS. (Patient not taking: Reported on 04/11/2024) 180 tablet 1   No facility-administered medications prior to visit.    Allergies  Allergen Reactions  . Tylenol  With Codeine #3 [Acetaminophen -Codeine] Nausea Only  . Zyprexa  [Olanzapine ] Other (See Comments)    myalgias    Review of Systems  Constitutional:  Positive for malaise/fatigue. Negative for fever.  HENT:  Negative for congestion.   Eyes:  Negative for blurred vision.  Respiratory:  Negative for shortness of breath.   Cardiovascular:  Negative for chest pain, palpitations and leg swelling.  Gastrointestinal:  Negative for abdominal pain, blood in  stool and nausea.  Genitourinary:  Negative for dysuria and frequency.  Musculoskeletal:  Positive for joint pain. Negative for falls.  Skin:  Negative for rash.  Neurological:  Negative for dizziness, loss of consciousness and headaches.  Endo/Heme/Allergies:  Negative for environmental allergies.  Psychiatric/Behavioral:  Negative for depression. The patient has insomnia. The patient is not nervous/anxious.        Objective:    Physical Exam Constitutional:      General: She is not in acute distress.    Appearance: Normal appearance. She is not ill-appearing or toxic-appearing.  HENT:     Head: Normocephalic and atraumatic.     Right Ear: External ear normal.     Left Ear: External ear normal.     Nose: Nose normal.  Eyes:  General:        Right eye: No discharge.        Left eye: No discharge.  Pulmonary:     Effort: Pulmonary effort is normal.  Skin:    Findings: No rash.  Neurological:     Mental Status: She is alert and oriented to person, place, and time.  Psychiatric:        Behavior: Behavior normal.     LMP 10/10/2016 (Approximate)  Wt Readings from Last 3 Encounters:  01/12/24 157 lb (71.2 kg)  07/12/23 163 lb (73.9 kg)  06/27/23 162 lb 12.8 oz (73.8 kg)       Assessment & Plan:  Dyslipidemia Assessment & Plan: Encourage heart healthy diet such as MIND or DASH diet, increase exercise, avoid trans fats, simple carbohydrates and processed foods, consider a krill or fish or flaxseed oil cap daily.     Type 2 diabetes mellitus with diabetic chronic kidney disease, unspecified CKD stage, unspecified whether long term insulin use (HCC) Assessment & Plan: [id number] acceptable, minimize simple carbs. Increase exercise as tolerated.    Arthralgia, unspecified joint  Other orders -     Estradiol ; Place 1 patch (0.025 mg total) onto the skin once a week.  Dispense: 4 patch; Refill: 5 -     Progesterone ; 1 cap po daily x 10 days  Dispense: 10 capsule; Refill:  4     Assessment and Plan Assessment & Plan Menopausal symptoms Significant menopausal symptoms including joint pain, cognitive impairment, and vaginal dryness. Suitable candidate for HRT due to absence of significant family history of gynecological cancers. Discussed benefits and risks of HRT. - Prescribe estradiol  patches once weekly, increase to twice weekly if symptoms persist. - Prescribe progesterone  pills for 10 days each month. - Discuss use of Premarin  cream for vaginal dryness, apply two to three times a week. - Evaluate HRT effectiveness during October physical.  Joint pain Significant joint pain in elbows, arms, and hands. Family history of psoriatic arthritis. HRT may alleviate symptoms. Emphasized protein intake and hydration. - Encourage protein intake every three to four hours and hydration with five ounces of water hourly. - Consider evaluation for psoriatic arthritis if symptoms persist.  General Health Maintenance Due for routine blood work. Blood glucose levels on higher end of normal. - Perform routine blood work during October physical. - Monitor blood glucose levels during upcoming visit.     I discussed the assessment and treatment plan with the patient. The patient was provided an opportunity to ask questions and all were answered. The patient agreed with the plan and demonstrated an understanding of the instructions.   The patient was advised to call back or seek an in-person evaluation if the symptoms worsen or if the condition fails to improve as anticipated.  Harlene Horton, MD [facility] at [facility] [phone] (phone) [phone] (fax)  [facility]

## 2024-05-06 ENCOUNTER — Encounter: Payer: Self-pay | Admitting: Family Medicine

## 2024-05-07 ENCOUNTER — Other Ambulatory Visit: Payer: Self-pay | Admitting: Family Medicine

## 2024-05-07 DIAGNOSIS — Z1231 Encounter for screening mammogram for malignant neoplasm of breast: Secondary | ICD-10-CM

## 2024-05-07 DIAGNOSIS — L989 Disorder of the skin and subcutaneous tissue, unspecified: Secondary | ICD-10-CM

## 2024-05-07 MED ORDER — AMPHETAMINE-DEXTROAMPHETAMINE 10 MG PO TABS: 10.0000 mg | ORAL_TABLET | Freq: Every day | ORAL | 0 refills | Status: DC

## 2024-05-08 ENCOUNTER — Encounter: Payer: Self-pay | Admitting: Physician Assistant

## 2024-05-08 ENCOUNTER — Ambulatory Visit (INDEPENDENT_AMBULATORY_CARE_PROVIDER_SITE_OTHER): Admitting: Physician Assistant

## 2024-05-08 VITALS — BP 145/81

## 2024-05-08 DIAGNOSIS — D492 Neoplasm of unspecified behavior of bone, soft tissue, and skin: Secondary | ICD-10-CM | POA: Diagnosis not present

## 2024-05-08 DIAGNOSIS — L720 Epidermal cyst: Secondary | ICD-10-CM

## 2024-05-08 DIAGNOSIS — C44311 Basal cell carcinoma of skin of nose: Secondary | ICD-10-CM | POA: Diagnosis not present

## 2024-05-08 DIAGNOSIS — L578 Other skin changes due to chronic exposure to nonionizing radiation: Secondary | ICD-10-CM | POA: Diagnosis not present

## 2024-05-08 DIAGNOSIS — D485 Neoplasm of uncertain behavior of skin: Secondary | ICD-10-CM

## 2024-05-08 NOTE — Patient Instructions (Signed)

## 2024-05-08 NOTE — Progress Notes (Signed)
   New Patient Visit   Subjective  Sara West is a 58 y.o. female NEW PATIENT who presents for the following: Spot on her nose that has been there for months and bleeds. Has never seen dermatology. No history of skin cancer.     The following portions of the chart were reviewed this encounter and updated as appropriate: medications, allergies, medical history  Review of Systems:  No other skin or systemic complaints except as noted in HPI or Assessment and Plan.  Objective  Well appearing patient in no apparent distress; mood and affect are within normal limits.   A focused examination was performed of the following areas: Face   Relevant exam findings are noted in the Assessment and Plan.  Right nose tip 0.9 cm erythematous scaly patch  Left nose tip 0.9 cm flesh colored recessed patch   Assessment & Plan    ACTINIC DAMAGE  -- Diffuse scaly erythematous macules with underlying dyspigmentation.     NEOPLASM OF UNCERTAIN BEHAVIOR OF SKIN (2) Right nose tip Skin / nail biopsy Type of biopsy: tangential   Informed consent: discussed and consent obtained   Timeout: patient name, date of birth, surgical site, and procedure verified   Procedure prep:  Patient was prepped and draped in usual sterile fashion Prep type:  Isopropyl alcohol  Anesthesia: the lesion was anesthetized in a standard fashion   Anesthetic:  1% lidocaine  w/ epinephrine  1-100,000 buffered w/ 8.4% NaHCO3 Instrument used: flexible razor blade   Hemostasis achieved with: pressure, aluminum chloride and electrodesiccation   Outcome: patient tolerated procedure well   Post-procedure details: sterile dressing applied and wound care instructions given   Dressing type: bandage and petrolatum    Specimen 1 - Surgical pathology Differential Diagnosis: BCC vs SCC vs other  Check Margins: No Left nose tip Skin / nail biopsy Type of biopsy: tangential   Informed consent: discussed and consent obtained    Timeout: patient name, date of birth, surgical site, and procedure verified   Procedure prep:  Patient was prepped and draped in usual sterile fashion Prep type:  Isopropyl alcohol  Anesthesia: the lesion was anesthetized in a standard fashion   Anesthetic:  1% lidocaine  w/ epinephrine  1-100,000 buffered w/ 8.4% NaHCO3 Instrument used: flexible razor blade   Hemostasis achieved with: pressure, aluminum chloride and electrodesiccation   Outcome: patient tolerated procedure well   Post-procedure details: sterile dressing applied and wound care instructions given   Dressing type: bandage and petrolatum    Specimen 2 - Surgical pathology Differential Diagnosis: BCC vs other   Check Margins: No ACTINIC SKIN DAMAGE    Return in about 6 months (around 11/08/2024) for TBSE.  I, Roseline Hutchinson, CMA, am acting as scribe for Jazzmyn Filion K, PA-C .   Documentation: I have reviewed the above documentation for accuracy and completeness, and I agree with the above.  Toi Stelly K, PA-C

## 2024-05-10 LAB — SURGICAL PATHOLOGY

## 2024-05-12 ENCOUNTER — Other Ambulatory Visit: Payer: Self-pay

## 2024-05-12 ENCOUNTER — Emergency Department (HOSPITAL_BASED_OUTPATIENT_CLINIC_OR_DEPARTMENT_OTHER)
Admission: EM | Admit: 2024-05-12 | Discharge: 2024-05-13 | Disposition: A | Discharge status: Home / Self Care | Attending: Emergency Medicine | Admitting: Emergency Medicine

## 2024-05-12 DIAGNOSIS — S60562A Insect bite (nonvenomous) of left hand, initial encounter: Secondary | ICD-10-CM | POA: Diagnosis not present

## 2024-05-12 DIAGNOSIS — T63481A Toxic effect of venom of other arthropod, accidental (unintentional), initial encounter: Secondary | ICD-10-CM | POA: Diagnosis not present

## 2024-05-12 NOTE — ED Triage Notes (Signed)
 Suspected insect bite to left hand first observed tonight ~8pm while sitting outside.   Left hand swelling with pitting edema.

## 2024-05-13 ENCOUNTER — Ambulatory Visit: Payer: Self-pay | Admitting: Physician Assistant

## 2024-05-13 ENCOUNTER — Encounter (HOSPITAL_BASED_OUTPATIENT_CLINIC_OR_DEPARTMENT_OTHER): Payer: Self-pay

## 2024-05-13 ENCOUNTER — Other Ambulatory Visit: Payer: Self-pay

## 2024-05-13 MED ORDER — DIPHENHYDRAMINE HCL 25 MG PO CAPS
25.0000 mg | ORAL_CAPSULE | Freq: Once | ORAL | Status: AC
  Administered 2024-05-13: 25 mg via ORAL
  Filled 2024-05-13: qty 1

## 2024-05-13 MED ORDER — HYDROCORTISONE 1 % EX CREA
TOPICAL_CREAM | Freq: Two times a day (BID) | CUTANEOUS | Status: DC
  Filled 2024-05-13: qty 28

## 2024-05-13 NOTE — ED Provider Notes (Signed)
 [place] EMERGENCY DEPARTMENT AT [facility]  Provider Note  CSN: [id number] Arrival date & time: 05/12/24 2351  History Chief Complaint  Patient presents with   Insect Bite    Sara West is a 58 y.o. female reports a few hours ago while sitting on her porch, she felt something bite her L hand. She did not see what it was, but over the next few hours she has had increased itching, swelling and redness. Also had a small area on her R elbow less severe. No trouble swallowing or breathing.    Home Medications Prior to Admission medications   Medication Sig Start Date End Date Taking? Authorizing Provider  ALPRAZolam  (XANAX ) 0.25 MG tablet Take 1 tablet (0.25 mg total) by mouth 3 (three) times daily as needed for anxiety. 12/26/23   Domenica Harlene LABOR, MD  amphetamine -dextroamphetamine  (ADDERALL) 10 MG tablet Take 1 tablet (10 mg total) by mouth daily. August 2025 05/07/24   Domenica Harlene LABOR, MD  estradiol  (CLIMARA  - DOSED IN MG/24 HR) 0.025 mg/24hr patch Place 1 patch (0.025 mg total) onto the skin once a week. 04/11/24   Domenica Harlene LABOR, MD  progesterone  (PROMETRIUM ) 100 MG capsule 1 cap po daily x 10 days 04/11/24   Domenica Harlene LABOR, MD     Allergies    Tylenol  with codeine #3 [acetaminophen -codeine] and Zyprexa  [olanzapine ]   Review of Systems   Review of Systems Please see HPI for pertinent positives and negatives  Physical Exam BP (!) 159/99   Pulse 83   Temp 98.5 F (36.9 C)   Resp 18   Ht 5' 4 (1.626 m)   Wt 68 kg   LMP 10/10/2016 (Approximate)   SpO2 96%   BMI 25.75 kg/m   Physical Exam Vitals and nursing note reviewed.  Constitutional:      Appearance: Normal appearance.  HENT:     Head: Normocephalic and atraumatic.     Nose: Nose normal.     Mouth/Throat:     Mouth: Mucous membranes are moist.  Eyes:     Extraocular Movements: Extraocular movements intact.     Conjunctiva/sclera: Conjunctivae normal.  Cardiovascular:     Rate and Rhythm: Normal  rate.  Pulmonary:     Effort: Pulmonary effort is normal.     Breath sounds: Normal breath sounds. No stridor.  Abdominal:     General: Abdomen is flat.     Palpations: Abdomen is soft.     Tenderness: There is no abdominal tenderness.  Musculoskeletal:        General: Swelling present. Normal range of motion.     Cervical back: Neck supple.     Comments: Mild erythema and soft tissue swelling to dorsal R hand extends to wrist; no distinct bite or sting marks  Skin:    General: Skin is warm and dry.  Neurological:     General: No focal deficit present.     Mental Status: She is alert.  Psychiatric:        Mood and Affect: Mood normal.     ED Results / Procedures / Treatments   EKG None  Procedures Procedures  Medications Ordered in the ED Medications  diphenhydrAMINE  (BENADRYL ) capsule 25 mg (has no administration in time range)  hydrocortisone  cream 1 % (has no administration in time range)    Initial Impression and Plan  Patient here with likely insect bite/sting to L hand. Some localized swelling but no signs of systemic reaction. She is specifically  concerned about a brown recluse bite, but doubt this given circumstances of the bite. Recommend benadryl , topical steroids for swelling and itching. Ice and elevation. PCP follow up, RTED for any other concerns.    ED Course       MDM Rules/Calculators/A&P Medical Decision Making Problems Addressed: Insect stings, accidental or unintentional, initial encounter: acute illness or injury  Risk OTC drugs.     Final Clinical Impression(s) / ED Diagnoses Final diagnoses:  Insect stings, accidental or unintentional, initial encounter    Rx / DC Orders ED Discharge Orders     None        Roselyn Carlin NOVAK, MD 05/13/24 [phone]

## 2024-05-31 ENCOUNTER — Ambulatory Visit
Admission: RE | Admit: 2024-05-31 | Discharge: 2024-05-31 | Disposition: A | Discharge status: Home / Self Care | Source: Ambulatory Visit | Attending: Family Medicine | Admitting: Family Medicine

## 2024-05-31 DIAGNOSIS — Z1231 Encounter for screening mammogram for malignant neoplasm of breast: Secondary | ICD-10-CM | POA: Diagnosis not present

## 2024-06-05 ENCOUNTER — Other Ambulatory Visit: Payer: Self-pay | Admitting: Family Medicine

## 2024-06-05 DIAGNOSIS — R928 Other abnormal and inconclusive findings on diagnostic imaging of breast: Secondary | ICD-10-CM

## 2024-06-14 ENCOUNTER — Ambulatory Visit
Admission: RE | Admit: 2024-06-14 | Discharge: 2024-06-14 | Disposition: A | Discharge status: Home / Self Care | Source: Ambulatory Visit | Attending: Family Medicine | Admitting: Family Medicine

## 2024-06-14 DIAGNOSIS — R928 Other abnormal and inconclusive findings on diagnostic imaging of breast: Secondary | ICD-10-CM

## 2024-06-14 DIAGNOSIS — N6489 Other specified disorders of breast: Secondary | ICD-10-CM | POA: Diagnosis not present

## 2024-06-24 ENCOUNTER — Encounter: Payer: Self-pay | Admitting: Dermatology

## 2024-06-27 ENCOUNTER — Encounter: Payer: Self-pay | Admitting: Dermatology

## 2024-06-27 ENCOUNTER — Ambulatory Visit: Admitting: Dermatology

## 2024-06-27 VITALS — BP 135/86 | HR 81 | Temp 97.8°F

## 2024-06-27 DIAGNOSIS — L821 Other seborrheic keratosis: Secondary | ICD-10-CM

## 2024-06-27 DIAGNOSIS — C44311 Basal cell carcinoma of skin of nose: Secondary | ICD-10-CM | POA: Diagnosis not present

## 2024-06-27 DIAGNOSIS — C4491 Basal cell carcinoma of skin, unspecified: Secondary | ICD-10-CM

## 2024-06-27 DIAGNOSIS — L905 Scar conditions and fibrosis of skin: Secondary | ICD-10-CM

## 2024-06-27 DIAGNOSIS — L578 Other skin changes due to chronic exposure to nonionizing radiation: Secondary | ICD-10-CM

## 2024-06-27 DIAGNOSIS — F4321 Adjustment disorder with depressed mood: Secondary | ICD-10-CM

## 2024-06-27 MED ORDER — OXYCODONE HCL 5 MG PO TABS: 5.0000 mg | ORAL_TABLET | Freq: Four times a day (QID) | ORAL | 0 refills | Status: DC | PRN

## 2024-06-27 MED ORDER — MUPIROCIN 2 % EX OINT: 1.0000 | TOPICAL_OINTMENT | Freq: Two times a day (BID) | CUTANEOUS | 2 refills | Status: DC

## 2024-06-27 NOTE — Progress Notes (Unsigned)
 Follow-Up Visit   Subjective  Sara West is a 58 y.o. female who presents for the following: mohs of right tip of nose  The following portions of the chart were reviewed this encounter and updated as appropriate: medications, allergies, medical history  Review of Systems:  No other skin or systemic complaints except as noted in HPI or Assessment and Plan.  Objective  Well appearing patient in no apparent distress; mood and affect are within normal limits.  A focused examination was performed of the following areas: Right tip of nose Relevant physical exam findings are noted in the Assessment and Plan.     Assessment & Plan   BASAL CELL CARCINOMA (BCC), UNSPECIFIED SITE Right Tip of Nose Mohs surgery  Consent obtained: written  Anticoagulation: Is the patient taking prescription anticoagulant and/or aspirin  prescribed/recommended by a physician? No   Was the anticoagulation regimen changed prior to Mohs? No    Procedure Details: Timeout: pre-procedure verification complete Procedure Prep: patient was prepped and draped in usual sterile fashion Prep type: chlorhexidine Biopsy accession number: [id number] Frozen section biopsy performed: No   Specimen debulked: No   Pre-Op diagnosis: basal cell carcinoma BCC subtype: sclerosing MohsAIQ Surgical site (if tumor spans multiple areas, please select predominant area): nose Surgery side: right Surgical site (from skin exam): Right Tip of Nose Pre-operative length (cm): 0.7 Pre-operative width (cm): 0.5 Indications for Mohs surgery: anatomic location where tissue conservation is critical Previously treated? No    Micrographic Surgery Details: Post-operative length (cm): 1.5 Post-operative width (cm): 1.3 Number of Mohs stages: 2 Cumulative additional sections past 5 per stage: 0 Post surgery depth of defect: skeletal muscle Is this a complex case (associate members only): No    Stage 1    Tumor features  identified on Mohs section: no tumor identified    Depth of tumor invasion after stage: skeletal muscle    Perineural invasion: no perineural invasion  Stage 2    Tumor features identified on Mohs section: no tumor identified    Depth of tumor invasion after stage: skeletal muscle    Perineural invasion: no perineural invasion  Patient tolerance of procedure: tolerated well, no immediate complications  Reconstruction: Was the defect reconstructed? Yes   Was reconstruction performed by the same Mohs surgeon? Yes   Setting of reconstruction: outpatient office When was reconstruction performed? same day Type of reconstruction: graft Graft type: full thickness Graft area (cm2): 4  Opioids: Did the patient receive a prescription for opioid/narcotic related to Mohs surgery? Yes   Indications for opioid/narcotics: patient required additional pain relief despite trial of non-opioid analgesia  Antibiotics: Does patient meet AHA guidelines for endocarditis?: No   Does patient meet AHA guidelines for orthopedic prophylaxis?: No   Were antibiotics given on the day of surgery? Yes   When were antibiotics given? post-operative Did surgery breach mucosa, expose cartilage/bone, involve an area of lymphedema/inflamed/infected tissue? No   Indication for post-operative antibiotics: anatomic location  Skin repair Complexity:  Complex Final length (cm):  3.8 Informed consent: discussed and consent obtained   Timeout: patient name, date of birth, surgical site, and procedure verified   Procedure prep:  Patient was prepped and draped in usual sterile fashion Prep type:  Chlorhexidine Anesthesia: the lesion was anesthetized in a standard fashion   Anesthetic:  1% lidocaine  w/ epinephrine  1-100,000 buffered w/ 8.4% NaHCO3 Reason for type of repair: reduce tension to allow closure and preserve normal anatomical and functional relationships   Undermining: edges  undermined   Subcutaneous layers (deep  stitches):  Suture size:  5-0 Suture type: Vicryl (polyglactin 910)   Stitches:  Buried vertical mattress Fine/surface layer approximation (top stitches):  Suture size:  5-0 Suture type: Prolene (polypropylene)   Stitches: simple interrupted and simple running   Suture removal (days):  10 Hemostasis achieved with: suture, pressure and electrodesiccation Outcome: patient tolerated procedure well with no complications   Post-procedure details: sterile dressing applied and wound care instructions given   Dressing type: bandage, pressure dressing and bacitracin    oxyCODONE  (OXY IR/ROXICODONE ) 5 MG immediate release tablet Take 1 tablet (5 mg total) by mouth every 6 (six) hours as needed for up to 8 doses.   Return in about 2 weeks (around 07/11/2024) for 10-12 days for suture removal.  I, Berwyn Lesches, Surg Tech III, am acting as scribe for RUFUS CHRISTELLA HOLY, MD.   Documentation: I have reviewed the above documentation for accuracy and completeness, and I agree with the above.  RUFUS CHRISTELLA HOLY, MD

## 2024-06-27 NOTE — Patient Instructions (Signed)

## 2024-07-01 ENCOUNTER — Telehealth: Payer: Self-pay

## 2024-07-01 NOTE — Telephone Encounter (Signed)
 Patient called our office on Friday and reached the on call service. She told them she as in 10/10 pain with n/v. Patient was advised to go to urgent care. Patient was taking oxycodone  and could not drive herself and did not have anyone available to drive her. I called her back this morning to get an update on her status. Patient states that she is feeling better and stopped taking the oxycodone  on Saturday and has just been taking tylenol . She has not been able to tolerate placing an ice pack over the area due to pan. I advised the patient to alternate the tylenol  and motrin  for pain. I advised that she can apply an ice pack as tolerated. I also apologized that this concern was not directed to Dr. Paci so that it could be addressed sooner. Patient understands instructions and has no additional concerns.

## 2024-07-04 ENCOUNTER — Encounter: Payer: Self-pay | Admitting: Family Medicine

## 2024-07-04 ENCOUNTER — Ambulatory Visit: Payer: BC Managed Care – PPO | Admitting: Family Medicine

## 2024-07-04 VITALS — BP 138/83 | HR 72 | Temp 97.7°F | Resp 16 | Ht 64.0 in | Wt 160.0 lb

## 2024-07-04 DIAGNOSIS — F32A Depression, unspecified: Secondary | ICD-10-CM

## 2024-07-04 DIAGNOSIS — Z0000 Encounter for general adult medical examination without abnormal findings: Secondary | ICD-10-CM | POA: Diagnosis not present

## 2024-07-04 DIAGNOSIS — M79601 Pain in right arm: Secondary | ICD-10-CM

## 2024-07-04 DIAGNOSIS — R41840 Attention and concentration deficit: Secondary | ICD-10-CM

## 2024-07-04 DIAGNOSIS — E1122 Type 2 diabetes mellitus with diabetic chronic kidney disease: Secondary | ICD-10-CM

## 2024-07-04 DIAGNOSIS — F4310 Post-traumatic stress disorder, unspecified: Secondary | ICD-10-CM

## 2024-07-04 DIAGNOSIS — E1169 Type 2 diabetes mellitus with other specified complication: Secondary | ICD-10-CM

## 2024-07-04 DIAGNOSIS — E785 Hyperlipidemia, unspecified: Secondary | ICD-10-CM

## 2024-07-04 DIAGNOSIS — F419 Anxiety disorder, unspecified: Secondary | ICD-10-CM | POA: Diagnosis not present

## 2024-07-04 DIAGNOSIS — C44311 Basal cell carcinoma of skin of nose: Secondary | ICD-10-CM

## 2024-07-04 DIAGNOSIS — C4491 Basal cell carcinoma of skin, unspecified: Secondary | ICD-10-CM | POA: Insufficient documentation

## 2024-07-04 DIAGNOSIS — N289 Disorder of kidney and ureter, unspecified: Secondary | ICD-10-CM | POA: Diagnosis not present

## 2024-07-04 MED ORDER — AMPHETAMINE-DEXTROAMPHETAMINE 10 MG PO TABS: 5.0000 mg | ORAL_TABLET | Freq: Every day | ORAL | 0 refills | Status: DC

## 2024-07-04 NOTE — Assessment & Plan Note (Signed)
 She is working with a Veterinary surgeon and has accepted a referral to psychiatry for further evaluation. She is considering EMDR
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
## 2024-07-04 NOTE — Patient Instructions (Addendum)
 Shingrix is the new shingles shot, 2 shots over 2-6 months, confirm coverage with insurance and document, then can return here for shots with nurse appt or at pharmacy   Flu and COVID annually ++  Protein every 3-4 hours Hydrate 5-10 ounces every 1-2 hours Move slower and intentionally  Preventive Care 65-58 Years Old, Female Preventive care refers to lifestyle choices and visits with your health care provider that can promote health and wellness. Preventive care visits are also called wellness exams. What can I expect for my preventive care visit? Counseling Your health care provider may ask you questions about your: Medical history, including: Past medical problems. Family medical history. Pregnancy history. Current health, including: Menstrual cycle. Method of birth control. Emotional well-being. Home life and relationship well-being. Sexual activity and sexual health. Lifestyle, including: Alcohol , nicotine or tobacco, and drug use. Access to firearms. Diet, exercise, and sleep habits. Work and work Astronomer. Sunscreen use. Safety issues such as seatbelt and bike helmet use. Physical exam Your health care provider will check your: Height and weight. These may be used to calculate your BMI (body mass index). BMI is a measurement that tells if you are at a healthy weight. Waist circumference. This measures the distance around your waistline. This measurement also tells if you are at a healthy weight and may help predict your risk of certain diseases, such as type 2 diabetes and high blood pressure. Heart rate and blood pressure. Body temperature. Skin for abnormal spots. What immunizations do I need?  Vaccines are usually given at various ages, according to a schedule. Your health care provider will recommend vaccines for you based on your age, medical history, and lifestyle or other factors, such as travel or where you work. What tests do I need? Screening Your health  care provider may recommend screening tests for certain conditions. This may include: Lipid and cholesterol levels. Diabetes screening. This is done by checking your blood sugar (glucose) after you have not eaten for a while (fasting). Pelvic exam and Pap test. Hepatitis B test. Hepatitis C test. HIV (human immunodeficiency virus) test. STI (sexually transmitted infection) testing, if you are at risk. Lung cancer screening. Colorectal cancer screening. Mammogram. Talk with your health care provider about when you should start having regular mammograms. This may depend on whether you have a family history of breast cancer. BRCA-related cancer screening. This may be done if you have a family history of breast, ovarian, tubal, or peritoneal cancers. Bone density scan. This is done to screen for osteoporosis. Talk with your health care provider about your test results, treatment options, and if necessary, the need for more tests. Follow these instructions at home: Eating and drinking  Eat a diet that includes fresh fruits and vegetables, whole grains, lean protein, and low-fat dairy products. Take vitamin and mineral supplements as recommended by your health care provider. Do not drink alcohol  if: Your health care provider tells you not to drink. You are pregnant, may be pregnant, or are planning to become pregnant. If you drink alcohol : Limit how much you have to 0-1 drink a day. Know how much alcohol  is in your drink. In the [place] one drink equals one 12 oz bottle of beer (355 mL), one 5 oz glass of wine (148 mL), or one 1 oz glass of hard liquor (44 mL). Lifestyle Brush your teeth every morning and night with fluoride toothpaste. Floss one time each day. Exercise for at least 30 minutes 5 or more days each week. Do  not use any products that contain nicotine or tobacco. These products include cigarettes, chewing tobacco, and vaping devices, such as e-cigarettes. If you need help quitting,  ask your health care provider. Do not use drugs. If you are sexually active, practice safe sex. Use a condom or other form of protection to prevent STIs. If you do not wish to become pregnant, use a form of birth control. If you plan to become pregnant, see your health care provider for a prepregnancy visit. Take aspirin  only as told by your health care provider. Make sure that you understand how much to take and what form to take. Work with your health care provider to find out whether it is safe and beneficial for you to take aspirin  daily. Find healthy ways to manage stress, such as: Meditation, yoga, or listening to music. Journaling. Talking to a trusted person. Spending time with friends and family. Minimize exposure to UV radiation to reduce your risk of skin cancer. Safety Always wear your seat belt while driving or riding in a vehicle. Do not drive: If you have been drinking alcohol . Do not ride with someone who has been drinking. When you are tired or distracted. While texting. If you have been using any mind-altering substances or drugs. Wear a helmet and other protective equipment during sports activities. If you have firearms in your house, make sure you follow all gun safety procedures. Seek help if you have been physically or sexually abused. What's next? Visit your health care provider once a year for an annual wellness visit. Ask your health care provider how often you should have your eyes and teeth checked. Stay up to date on all vaccines. This information is not intended to replace advice given to you by your health care provider. Make sure you discuss any questions you have with your health care provider. Document Revised: 03/03/2021 Document Reviewed: 03/03/2021 Elsevier Patient Education  2024 [employer].

## 2024-07-04 NOTE — Assessment & Plan Note (Signed)
 hgba1c acceptable, minimize simple carbs. Increase exercise as tolerated.

## 2024-07-04 NOTE — Assessment & Plan Note (Addendum)
 She has stopped the Alprazolam  and is managing with HRT but she still struggles she is referred to psychiatry for further evaluation.

## 2024-07-04 NOTE — Assessment & Plan Note (Signed)
 Had a Mohs procedure last week and is recovering well. Did not tolerate the Oxycodone  with nausea and headache but is better off the meds.

## 2024-07-04 NOTE — Assessment & Plan Note (Signed)
 Hydrate and monitor

## 2024-07-04 NOTE — Assessment & Plan Note (Signed)
 Notes pain in anterior upper right arm with extension. Is going to try to increase exercise and strength and if no improvement can consider a referral to sports med for further evaluation

## 2024-07-04 NOTE — Assessment & Plan Note (Addendum)
 Patient encouraged to maintain heart healthy diet, regular exercise, adequate sleep. Consider daily probiotics. Take medications as prescribed. Labs ordered and reviewed. Given and reviewed copy of ACP documents from [facility] Secretary of State and encouraged to complete and return  Colonoscopy 2018 repeat in 2028 [facility] 05/2024 repeat annually Pap follows with Dr Cleotilde

## 2024-07-07 NOTE — Assessment & Plan Note (Signed)
 Encourage heart healthy diet such as MIND or DASH diet, increase exercise, avoid trans fats, simple carbohydrates and processed foods, consider a krill or fish or flaxseed oil cap daily.

## 2024-07-07 NOTE — Assessment & Plan Note (Signed)
 Refill given on Adderall

## 2024-07-07 NOTE — Assessment & Plan Note (Signed)
 hgba1c acceptable, minimize simple carbs. Increase exercise as tolerated.

## 2024-07-08 ENCOUNTER — Encounter: Payer: Self-pay | Admitting: Dermatology

## 2024-07-11 ENCOUNTER — Ambulatory Visit (INDEPENDENT_AMBULATORY_CARE_PROVIDER_SITE_OTHER): Admitting: Dermatology

## 2024-07-11 ENCOUNTER — Encounter: Payer: Self-pay | Admitting: Dermatology

## 2024-07-11 VITALS — BP 141/90 | HR 80

## 2024-07-11 DIAGNOSIS — L905 Scar conditions and fibrosis of skin: Secondary | ICD-10-CM

## 2024-07-11 DIAGNOSIS — C4491 Basal cell carcinoma of skin, unspecified: Secondary | ICD-10-CM

## 2024-07-11 DIAGNOSIS — Z85828 Personal history of other malignant neoplasm of skin: Secondary | ICD-10-CM

## 2024-07-11 DIAGNOSIS — T1490XD Injury, unspecified, subsequent encounter: Secondary | ICD-10-CM

## 2024-07-11 MED ORDER — MUPIROCIN 2 % EX OINT: 1.0000 | TOPICAL_OINTMENT | Freq: Two times a day (BID) | CUTANEOUS | 2 refills | Status: AC

## 2024-07-11 NOTE — Progress Notes (Unsigned)
   Follow Up Visit   Subjective  Sara West is a 58 y.o. female who presents for the following: follow up from Mohs surgery   The patient presents for follow up from Mohs surgery for a BCC on the right tip of nose, treated on 06/27/2024, repaired with a FTSG. The patient has been bandaging the wound as directed. The endorse the following concerns: tenderness but it has improved  Patient had reached out to our on call service on 06/28/2024 and stated that her pain was 10/10 while taking the oxycodone . They advised her to go the emergency department and did not notify Dr. Corey. She discontinued the oxycodone  on 06/29/2024 and reports that she was feeling better on 07/01/2024.   The following portions of the chart were reviewed this encounter and updated as appropriate: medications, allergies, medical history  Review of Systems:  No other skin or systemic complaints except as noted in HPI or Assessment and Plan.  Objective  Well appearing patient in no apparent distress; mood and affect are within normal limits.  A focal examination was performed including the nose. All findings within normal limits unless otherwise noted below.  Healing wound with mild erythema  Relevant physical exam findings are noted in the Assessment and Plan.    Assessment & Plan   Healing Wound s/p Mohs for a BCC on the right tip of nose, treated on 06/27/2024, repaired with a FTSG - Reassured that wound is healing well - No evidence of infection - No swelling, induration, purulence, dehiscence, or tenderness out of proportion to the clinical exam, see photo abovetarting at 6 weeks post-op - Ok to continue ointment daily to wound under a bandage for another week  HISTORY OF BASAL CELL CARCINOMA OF THE SKIN - No evidence of recurrence today - Recommend regular full body skin exams - Recommend daily broad spectrum sunscreen SPF 30+ to sun-exposed areas, reapply every 2 hours as needed.  - Call if any new or  changing lesions are noted between office visits  Return in about 10 days (around 07/21/2024) for Wound Check follow up.  LILLETTE Rollene Gobble, RN, am acting as scribe for RUFUS CHRISTELLA COREY, MD .   Documentation: I have reviewed the above documentation for accuracy and completeness, and I agree with the above.  RUFUS CHRISTELLA COREY, MD

## 2024-07-24 ENCOUNTER — Ambulatory Visit: Admitting: Dermatology

## 2024-07-24 VITALS — BP 143/85 | HR 84

## 2024-07-24 DIAGNOSIS — Z85828 Personal history of other malignant neoplasm of skin: Secondary | ICD-10-CM

## 2024-07-24 DIAGNOSIS — Z48817 Encounter for surgical aftercare following surgery on the skin and subcutaneous tissue: Secondary | ICD-10-CM

## 2024-07-24 DIAGNOSIS — T1490XD Injury, unspecified, subsequent encounter: Secondary | ICD-10-CM

## 2024-07-24 DIAGNOSIS — C4491 Basal cell carcinoma of skin, unspecified: Secondary | ICD-10-CM

## 2024-07-24 NOTE — Patient Instructions (Signed)

## 2024-07-24 NOTE — Progress Notes (Unsigned)
   Follow Up Visit   Subjective  Sara West is a 58 y.o. female who presents for the following: follow up from Mohs surgery   The patient presents for follow up from Mohs surgery for a BCC on the right tip of nose, treated on 06/27/2024, repaired with a FTSG. The patient has been bandaging the wound as directed. The endorse the following concerns: tenderness but it has improved. Patient is using mederma scar cream   Patient had reached out to our on call service on 06/28/2024 and stated that her pain was 10/10 while taking the oxycodone . They advised her to go the emergency department and did not notify Dr. Corey. She discontinued the oxycodone  on 06/29/2024 and reports that she was feeling better on 07/01/2024.   The following portions of the chart were reviewed this encounter and updated as appropriate: medications, allergies, medical history  Review of Systems:  No other skin or systemic complaints except as noted in HPI or Assessment and Plan.  Objective  Well appearing patient in no apparent distress; mood and affect are within normal limits.  A focal examination was performed including the nose. All findings within normal limits unless otherwise noted below.  Healing wound with mild erythema  Relevant physical exam findings are noted in the Assessment and Plan.    Assessment & Plan   Healing Wound s/p Mohs for a BCC on the right tip of nose, treated on 06/27/2024, repaired with a FTSG - Reassured that wound is healing well - No evidence of infection - No swelling, induration, purulence, dehiscence, or tenderness out of proportion to the clinical exam, see photo abovetarting at 6 weeks post-op - Ok to continue ointment daily to wound under a bandage for another week  HISTORY OF BASAL CELL CARCINOMA OF THE SKIN - No evidence of recurrence today - Recommend regular full body skin exams - Recommend daily broad spectrum sunscreen SPF 30+ to sun-exposed areas, reapply every 2 hours  as needed.  - Call if any new or changing lesions are noted between office visits  No follow-ups on file.  LILLETTE Rollene Gobble, RN, am acting as scribe for RUFUS CHRISTELLA COREY, MD .   Documentation: I have reviewed the above documentation for accuracy and completeness, and I agree with the above.  RUFUS CHRISTELLA COREY, MD

## 2024-07-25 ENCOUNTER — Encounter: Payer: Self-pay | Admitting: Dermatology

## 2024-08-07 ENCOUNTER — Encounter: Payer: Self-pay | Admitting: Dermatology

## 2024-08-07 ENCOUNTER — Ambulatory Visit (INDEPENDENT_AMBULATORY_CARE_PROVIDER_SITE_OTHER): Admitting: Dermatology

## 2024-08-07 VITALS — BP 151/84 | HR 72 | Temp 98.0°F

## 2024-08-07 DIAGNOSIS — Z85828 Personal history of other malignant neoplasm of skin: Secondary | ICD-10-CM

## 2024-08-07 DIAGNOSIS — C4491 Basal cell carcinoma of skin, unspecified: Secondary | ICD-10-CM

## 2024-08-07 DIAGNOSIS — T1490XD Injury, unspecified, subsequent encounter: Secondary | ICD-10-CM

## 2024-08-07 DIAGNOSIS — L910 Hypertrophic scar: Secondary | ICD-10-CM

## 2024-08-07 NOTE — Patient Instructions (Addendum)

## 2024-08-07 NOTE — Progress Notes (Signed)
   Follow Up Visit   Subjective  Sara West is a 58 y.o. female who presents for the following: follow up from Mohs surgery   The patient presents for follow up from Mohs surgery for a BCC on the Right Tip of Nose, treated on 06/27/24, repaired with Burow's Graft. The patient has been bandaging the wound as directed. The endorse the following concerns: Numbness at the tip of her nose  The following portions of the chart were reviewed this encounter and updated as appropriate: medications, allergies, medical history  Review of Systems:  No other skin or systemic complaints except as noted in HPI or Assessment and Plan.  Objective  Well appearing patient in no apparent distress; mood and affect are within normal limits.  A focal examination was performed including scalp, head, face and Nose All findings within normal limits unless otherwise noted below.  Healing wound with mild erythema  Relevant physical exam findings are noted in the Assessment and Plan.      Assessment & Plan   Healing Wound s/p Mohs for a BCC on the right tip of nose, treated on 06/27/2024, repaired with a Burow's graft - Reassured that wound is healing well - No evidence of infection - No swelling, induration, purulence, dehiscence, or tenderness out of proportion to the clinical exam, see photo abovetarting at 6 weeks post-op  HISTORY OF BASAL CELL CARCINOMA OF THE SKIN - No evidence of recurrence today - Recommend regular full body skin exams - Recommend daily broad spectrum sunscreen SPF 30+ to sun-exposed areas, reapply every 2 hours as needed.  - Call if any new or changing lesions are noted between office visits  Hypertrophic scar - Will consider dermabrasion in about 6 weeks   Return in about 7 weeks (around 09/25/2024) for dremabrasion follow up.  I, Doyce Pan, CMA, am acting as scribe for RUFUS CHRISTELLA HOLY, MD.   Documentation: I have reviewed the above documentation for accuracy and  completeness, and I agree with the above.  RUFUS CHRISTELLA HOLY, MD

## 2024-09-04 ENCOUNTER — Other Ambulatory Visit: Payer: Self-pay | Admitting: Family Medicine

## 2024-09-04 ENCOUNTER — Encounter: Payer: Self-pay | Admitting: Family Medicine

## 2024-09-05 ENCOUNTER — Telehealth: Payer: Self-pay | Admitting: Family Medicine

## 2024-09-05 NOTE — Telephone Encounter (Signed)
 Called patient and no answer left message to return call.

## 2024-09-05 NOTE — Telephone Encounter (Unsigned)
 Copied from CRM #[id number]. Topic: General - Other >> Sep 05, 2024  1:50 PM Taleah C wrote: Reason for CRM: pt called back to return Sara West's call. Please call and advise.

## 2024-09-05 NOTE — Telephone Encounter (Signed)
Returned patient's call and no answer.

## 2024-09-06 MED ORDER — ESTRADIOL 0.025 MG/24HR TD PTWK: 0.0250 mg | MEDICATED_PATCH | TRANSDERMAL | 5 refills | Status: AC

## 2024-09-06 NOTE — Telephone Encounter (Signed)
 Patient returned call and was advised that Dr. Domenica agreed with sending in medication refills to cover pt until her new appointment with other PCP.  Patient would like to know if she could have anxiety medication send into pharmacy and her Adderall would be send in. She reports taking Klonopin in the pass but does not remember the dose and said she will take whatever dose Dr. Domenica agrees with.

## 2024-09-08 ENCOUNTER — Other Ambulatory Visit: Payer: Self-pay | Admitting: Family Medicine

## 2024-09-08 MED ORDER — AMPHETAMINE-DEXTROAMPHETAMINE 10 MG PO TABS: 5.0000 mg | ORAL_TABLET | Freq: Every day | ORAL | 0 refills | Status: AC

## 2024-09-08 MED ORDER — CLONAZEPAM 0.5 MG PO TABS: 0.5000 mg | ORAL_TABLET | Freq: Two times a day (BID) | ORAL | 2 refills | Status: AC | PRN

## 2024-10-16 ENCOUNTER — Ambulatory Visit: Admitting: Dermatology

## 2024-11-12 ENCOUNTER — Ambulatory Visit: Admitting: Physician Assistant
